# Patient Record
Sex: Female | Born: 1965 | Race: Black or African American | Hispanic: No | Smoking: Never smoker
Health system: Southern US, Community
[De-identification: ages and names within clinical notes are randomized; demographics above are authoritative.]

## PROBLEM LIST (undated history)

## (undated) DIAGNOSIS — D649 Anemia, unspecified: Secondary | ICD-10-CM

## (undated) DIAGNOSIS — I10 Essential (primary) hypertension: Secondary | ICD-10-CM

## (undated) DIAGNOSIS — K635 Polyp of colon: Secondary | ICD-10-CM

## (undated) DIAGNOSIS — K5730 Diverticulosis of large intestine without perforation or abscess without bleeding: Secondary | ICD-10-CM

## (undated) HISTORY — PX: TUBAL LIGATION: SHX77

## (undated) HISTORY — DX: Polyp of colon: K63.5

## (undated) HISTORY — DX: Diverticulosis of large intestine without perforation or abscess without bleeding: K57.30

## (undated) HISTORY — PX: COLONOSCOPY: SHX174

## (undated) HISTORY — PX: KNEE SURGERY: SHX244

## (undated) HISTORY — PX: POLYPECTOMY: SHX149

---

## 1998-05-27 ENCOUNTER — Other Ambulatory Visit: Admission: RE | Admit: 1998-05-27 | Discharge: 1998-05-27 | Payer: Self-pay | Admitting: Obstetrics and Gynecology

## 2001-09-16 ENCOUNTER — Emergency Department (HOSPITAL_COMMUNITY): Admission: EM | Admit: 2001-09-16 | Discharge: 2001-09-16 | Payer: Self-pay | Admitting: Emergency Medicine

## 2004-04-29 ENCOUNTER — Other Ambulatory Visit: Admission: RE | Admit: 2004-04-29 | Discharge: 2004-04-29 | Payer: Self-pay | Admitting: Family Medicine

## 2005-05-03 ENCOUNTER — Other Ambulatory Visit: Admission: RE | Admit: 2005-05-03 | Discharge: 2005-05-03 | Payer: Self-pay | Admitting: Family Medicine

## 2006-09-18 ENCOUNTER — Other Ambulatory Visit: Admission: RE | Admit: 2006-09-18 | Discharge: 2006-09-18 | Payer: Self-pay | Admitting: Family Medicine

## 2008-01-29 ENCOUNTER — Other Ambulatory Visit: Admission: RE | Admit: 2008-01-29 | Discharge: 2008-01-29 | Payer: Self-pay | Admitting: Family Medicine

## 2008-10-28 ENCOUNTER — Emergency Department (HOSPITAL_COMMUNITY): Admission: EM | Admit: 2008-10-28 | Discharge: 2008-10-28 | Payer: Self-pay | Admitting: Emergency Medicine

## 2008-12-10 ENCOUNTER — Encounter: Admission: RE | Admit: 2008-12-10 | Discharge: 2008-12-10 | Payer: Self-pay | Admitting: Chiropractic Medicine

## 2010-07-08 ENCOUNTER — Other Ambulatory Visit: Admission: RE | Admit: 2010-07-08 | Discharge: 2010-07-08 | Payer: Self-pay | Admitting: Family Medicine

## 2012-01-15 ENCOUNTER — Other Ambulatory Visit: Payer: Self-pay | Admitting: Family Medicine

## 2012-01-15 ENCOUNTER — Other Ambulatory Visit (HOSPITAL_COMMUNITY)
Admission: RE | Admit: 2012-01-15 | Discharge: 2012-01-15 | Disposition: A | Discharge status: Home / Self Care | Payer: BC Managed Care – PPO | Source: Ambulatory Visit | Attending: Family Medicine | Admitting: Family Medicine

## 2012-01-15 DIAGNOSIS — Z01419 Encounter for gynecological examination (general) (routine) without abnormal findings: Secondary | ICD-10-CM | POA: Insufficient documentation

## 2013-04-23 ENCOUNTER — Other Ambulatory Visit: Payer: Self-pay | Admitting: Radiology

## 2013-04-24 ENCOUNTER — Encounter (INDEPENDENT_AMBULATORY_CARE_PROVIDER_SITE_OTHER): Payer: Self-pay

## 2013-05-06 ENCOUNTER — Encounter (INDEPENDENT_AMBULATORY_CARE_PROVIDER_SITE_OTHER): Payer: Self-pay | Admitting: Surgery

## 2013-05-06 ENCOUNTER — Ambulatory Visit (INDEPENDENT_AMBULATORY_CARE_PROVIDER_SITE_OTHER): Payer: BC Managed Care – PPO | Admitting: Surgery

## 2013-05-06 VITALS — BP 140/98 | HR 72 | Temp 97.6°F | Resp 15 | Ht 66.0 in | Wt 235.4 lb

## 2013-05-06 DIAGNOSIS — D249 Benign neoplasm of unspecified breast: Secondary | ICD-10-CM

## 2013-05-06 DIAGNOSIS — D242 Benign neoplasm of left breast: Secondary | ICD-10-CM | POA: Insufficient documentation

## 2013-05-06 NOTE — Progress Notes (Signed)
Patient ID: Mia Davis, female   DOB: Sep 15, 1966, 47 y.o.   MRN: [id number]  Chief Complaint  Patient presents with  . New Evaluation    eval lft br    HPI Mia Davis is a 47 y.o. female.  *Referred by Dr. Tilda Burrow for left breast intraductal papilloma PCP - Dr. Lupe Carney  HPI This is a healthy female who recently underwent a routine screening mammogram. This showed a potential abnormality behind the left nipple. She underwent spot compression views and ultrasound which showed a hypoechoic mass at 3:00 in the retroareolar region measuring 7 x 8 mm. She then underwent needle biopsy which revealed intraductal papilloma with usual ductal hyperplasia. She is now referred for surgical evaluation.  Menarche - age 23 First Preg - 49 Breast feed - no Hormones - OCP 3 years   History reviewed. No pertinent past medical history.  Past Surgical History  Procedure Laterality Date  . Tubal ligation      Family History  Problem Relation Age of Onset  . Cancer Maternal Grandmother     pancreatic  Possible breast cancer in mother - patient unsure  Social History History  Substance Use Topics  . Smoking status: Never Smoker   . Smokeless tobacco: Never Used  . Alcohol Use: No    No Known Allergies  No current outpatient prescriptions on file.   No current facility-administered medications for this visit.    Review of Systems Review of Systems  Constitutional: Negative for fever, chills and unexpected weight change.  HENT: Negative for hearing loss, congestion, sore throat, trouble swallowing and voice change.   Eyes: Negative for visual disturbance.  Respiratory: Negative for cough and wheezing.   Cardiovascular: Negative for chest pain, palpitations and leg swelling.  Gastrointestinal: Negative for nausea, vomiting, abdominal pain, diarrhea, constipation, blood in stool, abdominal distention and anal bleeding.  Genitourinary: Negative for hematuria, vaginal bleeding  and difficulty urinating.  Musculoskeletal: Negative for arthralgias.  Skin: Negative for rash and wound.  Neurological: Negative for seizures, syncope and headaches.  Hematological: Negative for adenopathy. Does not bruise/bleed easily.  Psychiatric/Behavioral: Negative for confusion.    Blood pressure 140/98, pulse 72, temperature 97.6 F (36.4 C), temperature source Temporal, resp. rate 15, height 5\' 6"  (1.676 m), weight 235 lb 6.4 oz (106.777 kg).  Physical Exam Physical Exam WDWN in NAD HEENT:  EOMI, sclera anicteric Neck:  No masses, no thyromegaly Lungs:  CTA bilaterally; normal respiratory effort Breasts:  Symmetric; bilateral fibrocystic changes; no nipple retraction or discharge No palpable masses in either breast.  No axillary lymphadenopathy CV:  Regular rate and rhythm; no murmurs Abd:  +bowel sounds, soft, non-tender, no masses Ext:  Well-perfused; no edema Skin:  Warm, dry; no sign of jaundice  Data Reviewed Mammogram/ US/ path report as noted above  Assessment    Left intraductal papilloma - 3:00 retroareola     Plan    Left needle-localized lumpectomy.  The surgical procedure has been discussed with the patient.  Potential risks, benefits, alternative treatments, and expected outcomes have been explained.  All of the patient's questions at this time have been answered.  The likelihood of reaching the patient's treatment goal is good.  The patient understand the proposed surgical procedure and wishes to proceed.         Matika Bartell K. 05/06/2013, 5:41 PM

## 2013-05-08 ENCOUNTER — Telehealth (INDEPENDENT_AMBULATORY_CARE_PROVIDER_SITE_OTHER): Payer: Self-pay | Admitting: Surgery

## 2013-05-08 NOTE — Telephone Encounter (Signed)
Discussed pt financial responsibilities and placed in pending folder.

## 2013-05-12 ENCOUNTER — Encounter (INDEPENDENT_AMBULATORY_CARE_PROVIDER_SITE_OTHER): Payer: Self-pay

## 2013-11-21 ENCOUNTER — Emergency Department (HOSPITAL_COMMUNITY): Payer: BC Managed Care – PPO

## 2013-11-21 ENCOUNTER — Emergency Department (HOSPITAL_COMMUNITY)
Admission: EM | Admit: 2013-11-21 | Discharge: 2013-11-21 | Disposition: A | Discharge status: Home / Self Care | Payer: Self-pay | Attending: Emergency Medicine | Admitting: Emergency Medicine

## 2013-11-21 ENCOUNTER — Encounter (HOSPITAL_COMMUNITY): Payer: Self-pay | Admitting: Emergency Medicine

## 2013-11-21 DIAGNOSIS — S0003XA Contusion of scalp, initial encounter: Secondary | ICD-10-CM | POA: Insufficient documentation

## 2013-11-21 DIAGNOSIS — K5792 Diverticulitis of intestine, part unspecified, without perforation or abscess without bleeding: Secondary | ICD-10-CM

## 2013-11-21 DIAGNOSIS — S301XXA Contusion of abdominal wall, initial encounter: Secondary | ICD-10-CM | POA: Insufficient documentation

## 2013-11-21 DIAGNOSIS — K6289 Other specified diseases of anus and rectum: Secondary | ICD-10-CM | POA: Insufficient documentation

## 2013-11-21 DIAGNOSIS — Z3202 Encounter for pregnancy test, result negative: Secondary | ICD-10-CM | POA: Insufficient documentation

## 2013-11-21 DIAGNOSIS — R296 Repeated falls: Secondary | ICD-10-CM | POA: Insufficient documentation

## 2013-11-21 DIAGNOSIS — S1093XA Contusion of unspecified part of neck, initial encounter: Secondary | ICD-10-CM

## 2013-11-21 DIAGNOSIS — Y9301 Activity, walking, marching and hiking: Secondary | ICD-10-CM | POA: Insufficient documentation

## 2013-11-21 DIAGNOSIS — K5732 Diverticulitis of large intestine without perforation or abscess without bleeding: Secondary | ICD-10-CM | POA: Insufficient documentation

## 2013-11-21 DIAGNOSIS — Y929 Unspecified place or not applicable: Secondary | ICD-10-CM | POA: Insufficient documentation

## 2013-11-21 DIAGNOSIS — S0083XA Contusion of other part of head, initial encounter: Secondary | ICD-10-CM

## 2013-11-21 LAB — URINALYSIS, ROUTINE W REFLEX MICROSCOPIC
BILIRUBIN URINE: NEGATIVE
GLUCOSE, UA: NEGATIVE mg/dL
Ketones, ur: NEGATIVE mg/dL
Leukocytes, UA: NEGATIVE
Nitrite: NEGATIVE
PH: 6.5 (ref 5.0–8.0)
Protein, ur: NEGATIVE mg/dL
SPECIFIC GRAVITY, URINE: 1.007 (ref 1.005–1.030)
Urobilinogen, UA: 0.2 mg/dL (ref 0.0–1.0)

## 2013-11-21 LAB — CBC WITH DIFFERENTIAL/PLATELET
BASOS ABS: 0 10*3/uL (ref 0.0–0.1)
Basophils Relative: 0 % (ref 0–1)
Eosinophils Absolute: 0.1 10*3/uL (ref 0.0–0.7)
Eosinophils Relative: 1 % (ref 0–5)
HEMATOCRIT: 26.9 % — AB (ref 36.0–46.0)
HEMOGLOBIN: 9.2 g/dL — AB (ref 12.0–15.0)
LYMPHS PCT: 22 % (ref 12–46)
Lymphs Abs: 2.4 10*3/uL (ref 0.7–4.0)
MCH: 23.7 pg — ABNORMAL LOW (ref 26.0–34.0)
MCHC: 34.2 g/dL (ref 30.0–36.0)
MCV: 69.3 fL — ABNORMAL LOW (ref 78.0–100.0)
MONOS PCT: 5 % (ref 3–12)
Monocytes Absolute: 0.6 10*3/uL (ref 0.1–1.0)
NEUTROS ABS: 7.9 10*3/uL — AB (ref 1.7–7.7)
NEUTROS PCT: 72 % (ref 43–77)
Platelets: 253 10*3/uL (ref 150–400)
RBC: 3.88 MIL/uL (ref 3.87–5.11)
RDW: 16.9 % — AB (ref 11.5–15.5)
WBC: 11 10*3/uL — AB (ref 4.0–10.5)

## 2013-11-21 LAB — COMPREHENSIVE METABOLIC PANEL
ALBUMIN: 3 g/dL — AB (ref 3.5–5.2)
ALT: 15 U/L (ref 0–35)
AST: 15 U/L (ref 0–37)
Alkaline Phosphatase: 69 U/L (ref 39–117)
BUN: 14 mg/dL (ref 6–23)
CALCIUM: 8.2 mg/dL — AB (ref 8.4–10.5)
CO2: 23 meq/L (ref 19–32)
Chloride: 102 mEq/L (ref 96–112)
Creatinine, Ser: 0.99 mg/dL (ref 0.50–1.10)
GFR calc Af Amer: 76 mL/min — ABNORMAL LOW (ref >90)
GFR calc non Af Amer: 66 mL/min — ABNORMAL LOW (ref >90)
Glucose, Bld: 125 mg/dL — ABNORMAL HIGH (ref 70–99)
Potassium: 3.4 mEq/L — ABNORMAL LOW (ref 3.7–5.3)
SODIUM: 136 meq/L — AB (ref 137–147)
TOTAL PROTEIN: 6.3 g/dL (ref 6.0–8.3)
Total Bilirubin: 0.5 mg/dL (ref 0.3–1.2)

## 2013-11-21 LAB — URINE MICROSCOPIC-ADD ON

## 2013-11-21 LAB — OCCULT BLOOD, POC DEVICE: FECAL OCCULT BLD: POSITIVE — AB

## 2013-11-21 LAB — PREGNANCY, URINE: PREG TEST UR: NEGATIVE

## 2013-11-21 MED ORDER — METRONIDAZOLE 500 MG PO TABS: 500.0000 mg | ORAL_TABLET | Freq: Three times a day (TID) | ORAL | Status: DC

## 2013-11-21 MED ORDER — CIPROFLOXACIN IN D5W 400 MG/200ML IV SOLN: 400.0000 mg | Freq: Once | INTRAVENOUS | Status: DC

## 2013-11-21 MED ORDER — METRONIDAZOLE IN NACL 5-0.79 MG/ML-% IV SOLN: 500.0000 mg | Freq: Once | INTRAVENOUS | Status: DC

## 2013-11-21 MED ORDER — SODIUM CHLORIDE 0.9 % IV BOLUS (SEPSIS)
1000.0000 mL | Freq: Once | INTRAVENOUS | Status: AC
  Administered 2013-11-21: 1000 mL via INTRAVENOUS

## 2013-11-21 MED ORDER — IOHEXOL 300 MG/ML  SOLN
100.0000 mL | Freq: Once | INTRAMUSCULAR | Status: AC | PRN
  Administered 2013-11-21: 100 mL via INTRAVENOUS

## 2013-11-21 MED ORDER — IOHEXOL 300 MG/ML  SOLN
25.0000 mL | Freq: Once | INTRAMUSCULAR | Status: AC | PRN
  Administered 2013-11-21: 25 mL via ORAL

## 2013-11-21 MED ORDER — ONDANSETRON HCL 4 MG/2ML IJ SOLN
4.0000 mg | Freq: Once | INTRAMUSCULAR | Status: DC
  Filled 2013-11-21: qty 2

## 2013-11-21 MED ORDER — MORPHINE SULFATE 4 MG/ML IJ SOLN
4.0000 mg | Freq: Once | INTRAMUSCULAR | Status: DC
  Filled 2013-11-21: qty 1

## 2013-11-21 MED ORDER — CIPROFLOXACIN HCL 500 MG PO TABS
500.0000 mg | ORAL_TABLET | Freq: Once | ORAL | Status: AC
  Administered 2013-11-21: 500 mg via ORAL
  Filled 2013-11-21: qty 1

## 2013-11-21 MED ORDER — METRONIDAZOLE 500 MG PO TABS
500.0000 mg | ORAL_TABLET | Freq: Once | ORAL | Status: AC
  Administered 2013-11-21: 500 mg via ORAL
  Filled 2013-11-21: qty 1

## 2013-11-21 MED ORDER — CIPROFLOXACIN HCL 500 MG PO TABS: 500.0000 mg | ORAL_TABLET | Freq: Two times a day (BID) | ORAL | Status: DC

## 2013-11-21 NOTE — ED Provider Notes (Signed)
CSN: [id number]     Arrival date & time 11/21/13  0400 History   First MD Initiated Contact with Patient 11/21/13 0436     Chief Complaint  Patient presents with  . Rectal Bleeding     (Consider location/radiation/quality/duration/timing/severity/associated sxs/prior Treatment) HPI 48 year old female presents to emergency room with complaint of bright red blood per rectum since 6 PM this evening.  No prior history of same.  No rectal pain, no abdominal pain.  No stool mixed with blood only blood.  She reports she has passed some clots.  Patient had syncopal event walking from the bathroom, has abrasion and bruise to right abdomen, and contusion to left forhead.  She complains of mild headache.  Dizzy with standing.  Area to prior history of rectal bleeding, hemorrhoids, diverticulitis.  No fevers no chills.  No nausea no vomiting. History reviewed. No pertinent past medical history. Past Surgical History  Procedure Laterality Date  . Tubal ligation     Family History  Problem Relation Age of Onset  . Cancer Maternal Grandmother     pancreatic   History  Substance Use Topics  . Smoking status: Never Smoker   . Smokeless tobacco: Never Used  . Alcohol Use: No   OB History   Grav Para Term Preterm Abortions TAB SAB Ect Mult Living                 Review of Systems  See History of Present Illness; otherwise all other systems are reviewed and negative   Allergies  Review of patient's allergies indicates no known allergies.  Home Medications  No current outpatient prescriptions on file. BP 125/92  Pulse 107  Temp(Src) 98.4 F (36.9 C) (Oral)  Resp 16  Ht 5\' 6"  (1.676 m)  Wt 220 lb (99.791 kg)  BMI 35.53 kg/m2  SpO2 100% Physical Exam  Nursing note and vitals reviewed. Constitutional: She is oriented to person, place, and time. She appears well-developed and well-nourished. She appears distressed (uncomfortable appearing).  HENT:  Head: Normocephalic.  Right Ear:  External ear normal.  Left Ear: External ear normal.  Nose: Nose normal.  Mouth/Throat: Oropharynx is clear and moist.  Contusion to left for head and temple area  Eyes: Conjunctivae and EOM are normal. Pupils are equal, round, and reactive to light.  Neck: Normal range of motion. Neck supple. No JVD present. No tracheal deviation present. No thyromegaly present.  Cardiovascular: Normal rate, regular rhythm, normal heart sounds and intact distal pulses.  Exam reveals no gallop and no friction rub.   No murmur heard. Pulmonary/Chest: Effort normal and breath sounds normal. No stridor. No respiratory distress. She has no wheezes. She has no rales. She exhibits no tenderness.  Abdominal: Soft. Bowel sounds are normal. She exhibits no distension and no mass. There is tenderness (tenderness to palpation inleft lower quadrant). There is no rebound and no guarding.  Patient with abrasion and reddening of the skin in right upper quadrant from fall  Genitourinary: Guaiac positive stool (streaks of bright red blood on glove with  Patient does have hemorrhoid, but is nonthrombosed and nontender).  Musculoskeletal: Normal range of motion. She exhibits no edema and no tenderness.  Lymphadenopathy:    She has no cervical adenopathy.  Neurological: She is alert and oriented to person, place, and time. She exhibits normal muscle tone. Coordination normal.  Skin: Skin is warm and dry. No rash noted. No erythema. No pallor.  Psychiatric: She has a normal mood and affect. Her behavior  is normal. Judgment and thought content normal.    ED Course  Procedures (including critical care time) Labs Review Labs Reviewed  CBC WITH DIFFERENTIAL - Abnormal; Notable for the following:    WBC 11.0 (*)    Hemoglobin 9.2 (*)    HCT 26.9 (*)    MCV 69.3 (*)    MCH 23.7 (*)    RDW 16.9 (*)    Neutro Abs 7.9 (*)    All other components within normal limits  COMPREHENSIVE METABOLIC PANEL - Abnormal; Notable for the  following:    Sodium 136 (*)    Potassium 3.4 (*)    Glucose, Bld 125 (*)    Calcium 8.2 (*)    Albumin 3.0 (*)    GFR calc non Af Amer 66 (*)    GFR calc Af Amer 76 (*)    All other components within normal limits  URINALYSIS, ROUTINE W REFLEX MICROSCOPIC - Abnormal; Notable for the following:    Color, Urine STRAW (*)    Hgb urine dipstick TRACE (*)    All other components within normal limits  OCCULT BLOOD, POC DEVICE - Abnormal; Notable for the following:    Fecal Occult Bld POSITIVE (*)    All other components within normal limits  PREGNANCY, URINE  URINE MICROSCOPIC-ADD ON  OCCULT BLOOD X 1 CARD TO LAB, STOOL   Imaging Review Ct Head Wo Contrast  11/21/2013   CLINICAL DATA:  Pain post trauma  EXAM: CT HEAD WITHOUT CONTRAST  TECHNIQUE: Contiguous axial images were obtained from the base of the skull through the vertex without intravenous contrast.  COMPARISON:  None.  FINDINGS: The ventricles are normal in size and configuration. There is no mass, hemorrhage, extra-axial fluid collection, or midline shift. Gray-white compartments appear normal. No demonstrable acute infarct. Bony calvarium appears intact. The mastoid air cells are clear.  IMPRESSION: Study within normal limits.   Electronically Signed   By: Lowella Grip M.D.   On: 11/21/2013 07:07   Ct Abdomen Pelvis W Contrast  11/21/2013   ADDENDUM REPORT: 11/21/2013 07:14  ADDENDUM: Sentence in the conclusion should read: Suspect a degree of proctitis.   Electronically Signed   By: Lowella Grip M.D.   On: 11/21/2013 07:14   11/21/2013   CLINICAL DATA:  Pain and rectal bleeding  EXAM: CT ABDOMEN AND PELVIS WITH CONTRAST  TECHNIQUE: Multidetector CT imaging of the abdomen and pelvis was performed using the standard protocol following bolus administration of intravenous contrast. Oral contrast was also administered.  CONTRAST:  158mL OMNIPAQUE IOHEXOL 300 MG/ML  SOLN  COMPARISON:  None.  FINDINGS: On axial slice 7, there is a  6 mm nodular opacity in the anterior segment of the left lower lobe. On axial slice 2, there is a 2 mm nodular opacity in the anterior segment of the left lower lobe. There is no airspace consolidation in the lung bases.  There is fatty infiltration in the liver. Beyond fatty infiltration, there are no focal liver lesions. There is no biliary duct dilatation. Gallbladder wall does not appear appreciably thickened.  There is a small calcified granuloma in the posterior spleen. Spleen otherwise appears normal. Pancreas and adrenals appear normal.  Kidneys bilaterally show no appreciable mass or hydronephrosis on either side. No renal or ureteral calculus is seen on either side.  In the pelvis, the ureter is enlarged with multiple lobulations and mass lesions. There is a dominant mass within the uterus measuring 6.0 x 5.2 cm. There is  stranding in the soft tissues adjacent to the rectum with some thickening of the wall of the rectum. There is a small amount of loculated fluid immediately adjacent to the proximal sigmoid colon, probably representing some localized colonic inflammation, likely diverticulitis. A well-defined diverticulum is not seen in this area, however. There is no pelvic mass seen. Appendix appears normal.  There is no bowel obstruction. There is no free air or portal venous air.  There is no adenopathy or abscess in the abdomen or pelvis. Aorta is non aneurysmal. There are no blastic or lytic bone lesions.  IMPRESSION: There is localized inflammation immediately adjacent to the proximal sigmoid colon anteriorly with localized loculated fluid in this area. Suspect a small area of localized diverticulitis.  There is perirectal soft tissue stranding in mild rectal thickening. Suspect a degree of prostatitis.  Enlarged uterus with presumed leiomyomas throughout the uterus.  Appendix appears normal. No renal or ureteral calculus. No hydronephrosis.  Fatty infiltration in the liver.  Nodular opacities in  the left lung base, largest measuring 6 mm. Followup should be based on Fleischner Society guidelines. If the patient is at high risk for bronchogenic carcinoma, follow-up chest CT at 6-12 months is recommended. If the patient is at low risk for bronchogenic carcinoma, follow-up chest CT at 12 months is recommended. This recommendation follows the consensus statement: Guidelines for Management of Small Pulmonary Nodules Detected on CT Scans: A Statement from the [place] as published in Radiology 2005;237:395-400.  Electronically Signed: By: Lowella Grip M.D. On: 11/21/2013 07:06    EKG Interpretation   None       MDM   Final diagnoses:  Diverticulitis  Proctitis    48 year old female with lower GI bleeding, left lower quadrant pain.  Concern for diverticulitis.  Patient noted to have anemia, reports history of same, but does not know her normal values.  She has no significant elevation in her BUN.  He shouldn't with syncopal event, probable orthostatic hypotension, possible vagal reaction.  She does have a positive orthostatics.  Will give IV fluids.    Kalman Drape, MD 11/21/13 [phone]

## 2013-11-21 NOTE — Discharge Instructions (Signed)
Please take medications as prescribed.  Return to the emergency room for fevers, worsening bleeding, repeated episodes of passing out or other new concerning symptoms.  Rest.  Drink plenty of fluids.  Stick to a bland diet until feeling better.   Bloody Stools Bloody stools often mean that there is a problem in the digestive tract. Your caregiver may use the term "melena" to describe black, tarry, and bad smelling stools or "hematochezia" to describe red or maroon-colored stools. Blood seen in the stool can be caused by bleeding anywhere along the intestinal tract.  A black stool usually means that blood is coming from the upper part of the gastrointestinal tract (esophagus, stomach, or small bowel). Passing maroon-colored stools or bright red blood usually means that blood is coming from lower down in the large bowel or the rectum. However, sometimes massive bleeding in the stomach or small intestine can cause bright red bloody stools.  Consuming black licorice, lead, iron pills, medicines containing bismuth subsalicylate, or blueberries can also cause black stools. Your caregiver can test black stools to see if blood is present. It is important that the cause of the bleeding be found. Treatment can then be started, and the problem can be corrected. Rectal bleeding may not be serious, but you should not assume everything is okay until you know the cause.It is very important to follow up with your caregiver or a specialist in gastrointestinal problems. CAUSES  Blood in the stools can come from various underlying causes.Often, the cause is not found during your first visit. Testing is often needed to discover the cause of bleeding in the gastrointestinal tract. Causes range from simple to serious or even life-threatening.Possible causes include:  Hemorrhoids.These are veins that are full of blood (engorged) in the rectum. They cause pain, inflammation, and may bleed.  Anal fissures.These are areas  of painful tearing which may bleed. They are often caused by passing hard stool.  Diverticulosis.These are pouches that form on the colon over time, with age, and may bleed significantly.  Diverticulitis.This is inflammation in areas with diverticulosis. It can cause pain, fever, and bloody stools, although bleeding is rare.  Proctitis and colitis. These are inflamed areas of the rectum or colon. They may cause pain, fever, and bloody stools.  Polyps and cancer. Colon cancer is a leading cause of preventable cancer death.It often starts out as precancerous polyps that can be removed during a colonoscopy, preventing progression into cancer. Sometimes, polyps and cancer may cause rectal bleeding.  Gastritis and ulcers.Bleeding from the upper gastrointestinal tract (near the stomach) may travel through the intestines and produce black, sometimes tarry, often bad smelling stools. In certain cases, if the bleeding is fast enough, the stools may not be black, but red and the condition may be life-threatening. SYMPTOMS  You may have stools that are bright red and bloody, that are normal color with blood on them, or that are dark black and tarry. In some cases, you may only have blood in the toilet bowl. Any of these cases need medical care. You may also have:  Pain at the anus or anywhere in the rectum.  Lightheadedness or feeling faint.  Extreme weakness.  Nausea or vomiting.  Fever. DIAGNOSIS Your caregiver may use the following methods to find the cause of your bleeding:  Taking a medical history. Age is important. Older people tend to develop polyps and cancer more often. If there is anal pain and a hard, large stool associated with bleeding, a tear of the  anus may be the cause. If blood drips into the toilet after a bowel movement, bleeding hemorrhoids may be the problem. The color and frequency of the bleeding are additional considerations. In most cases, the medical history provides  clues, but seldom the final answer.  A visual and finger (digital) exam. Your caregiver will inspect the anal area, looking for tears and hemorrhoids. A finger exam can provide information when there is tenderness or a growth inside. In men, the prostate is also examined.  Endoscopy. Several types of small, long scopes (endoscopes) are used to view the colon.  In the office, your caregiver may use a rigid, or more commonly, a flexible viewing sigmoidoscope. This exam is called flexible sigmoidoscopy. It is performed in 5 to 10 minutes.  A more thorough exam is accomplished with a colonoscope. It allows your caregiver to view the entire 5 to 6 foot long colon. Medicine to help you relax (sedative) is usually given for this exam. Frequently, a bleeding lesion may be present beyond the reach of the sigmoidoscope. So, a colonoscopy may be the best exam to start with. Both exams are usually done on an outpatient basis. This means the patient does not stay overnight in the hospital or surgery center.  An upper endoscopy may be needed to examine your stomach. Sedation is used and a flexible endoscope is put in your mouth, down to your stomach.  A barium enema X-ray. This is an X-ray exam. It uses liquid barium inserted by enema into the rectum. This test alone may not identify an actual bleeding point. X-rays highlight abnormal shadows, such as those made by lumps (tumors), diverticuli, or colitis. TREATMENT  Treatment depends on the cause of your bleeding.   For bleeding from the stomach or colon, the caregiver doing your endoscopy or colonoscopy may be able to stop the bleeding as part of the procedure.  Inflammation or infection of the colon can be treated with medicines.  Many rectal problems can be treated with creams, suppositories, or warm baths.  Surgery is sometimes needed.  Blood transfusions are sometimes needed if you have lost a lot of blood.  For any bleeding problem, let your  caregiver know if you take aspirin or other blood thinners regularly. HOME CARE INSTRUCTIONS   Take any medicines exactly as prescribed.  Keep your stools soft by eating a diet high in fiber. Prunes (1 to 3 a day) work well for many people.  Drink enough water and fluids to keep your urine clear or pale yellow.  Take sitz baths if advised. A sitz bath is when you sit in a bathtub with warm water for 10 to 15 minutes to soak, soothe, and cleanse the rectal area.  If enemas or suppositories are advised, be sure you know how to use them. Tell your caregiver if you have problems with this.  Monitor your bowel movements to look for signs of improvement or worsening. SEEK MEDICAL CARE IF:   You do not improve in the time expected.  Your condition worsens after initial improvement.  You develop any new symptoms. SEEK IMMEDIATE MEDICAL CARE IF:   You develop severe or prolonged rectal bleeding.  You vomit blood.  You feel weak or faint.  You have a fever. MAKE SURE YOU:  Understand these instructions.  Will watch your condition.  Will get help right away if you are not doing well or get worse. Document Released: 09/15/2002 Document Revised: 12/18/2011 Document Reviewed: 02/10/2011 [employer] Patient Information 2014 [place].  Diverticulitis A diverticulum is a small pouch or sac on the colon. Diverticulosis is the presence of these diverticula on the colon. Diverticulitis is the irritation (inflammation) or infection of diverticula. CAUSES  The colon and its diverticula contain bacteria. If food particles block the tiny opening to a diverticulum, the bacteria inside can grow and cause an increase in pressure. This leads to infection and inflammation and is called diverticulitis. SYMPTOMS   Abdominal pain and tenderness. Usually, the pain is located on the left side of your abdomen. However, it could be located elsewhere.  Fever.  Bloating.  Feeling sick to your  stomach (nausea).  Throwing up (vomiting).  Abnormal stools. DIAGNOSIS  Your caregiver will take a history and perform a physical exam. Since many things can cause abdominal pain, other tests may be necessary. Tests may include:  Blood tests.  Urine tests.  X-ray of the abdomen.  CT scan of the abdomen. Sometimes, surgery is needed to determine if diverticulitis or other conditions are causing your symptoms. TREATMENT  Most of the time, you can be treated without surgery. Treatment includes:  Resting the bowels by only having liquids for a few days. As you improve, you will need to eat a low-fiber diet.  Intravenous (IV) fluids if you are losing body fluids (dehydrated).  Antibiotic medicines that treat infections may be given.  Pain and nausea medicine, if needed.  Surgery if the inflamed diverticulum has burst. HOME CARE INSTRUCTIONS   Try a clear liquid diet (broth, tea, or water for as long as directed by your caregiver). You may then gradually begin a low-fiber diet as tolerated.  A low-fiber diet is a diet with less than 10 grams of fiber. Choose the foods below to reduce fiber in the diet:  White breads, cereals, rice, and pasta.  Cooked fruits and vegetables or soft fresh fruits and vegetables without the skin.  Ground or well-cooked tender beef, ham, veal, lamb, pork, or poultry.  Eggs and seafood.  After your diverticulitis symptoms have improved, your caregiver may put you on a high-fiber diet. A high-fiber diet includes 14 grams of fiber for every 1000 calories consumed. For a standard 2000 calorie diet, you would need 28 grams of fiber. Follow these diet guidelines to help you increase the fiber in your diet. It is important to slowly increase the amount fiber in your diet to avoid gas, constipation, and bloating.  Choose whole-grain breads, cereals, pasta, and brown rice.  Choose fresh fruits and vegetables with the skin on. Do not overcook vegetables  because the more vegetables are cooked, the more fiber is lost.  Choose more nuts, seeds, legumes, dried peas, beans, and lentils.  Look for food products that have greater than 3 grams of fiber per serving on the Nutrition Facts label.  Take all medicine as directed by your caregiver.  If your caregiver has given you a follow-up appointment, it is very important that you go. Not going could result in lasting (chronic) or permanent injury, pain, and disability. If there is any problem keeping the appointment, call to reschedule. SEEK MEDICAL CARE IF:   Your pain does not improve.  You have a hard time advancing your diet beyond clear liquids.  Your bowel movements do not return to normal. SEEK IMMEDIATE MEDICAL CARE IF:   Your pain becomes worse.  You have an oral temperature above 102 F (38.9 C), not controlled by medicine.  You have repeated vomiting.  You have bloody or black,  tarry stools.  Symptoms that brought you to your caregiver become worse or are not getting better. MAKE SURE YOU:   Understand these instructions.  Will watch your condition.  Will get help right away if you are not doing well or get worse. Document Released: 07/05/2005 Document Revised: 12/18/2011 Document Reviewed: 10/31/2010 [facility] Patient Information 2014 [place].  Proctitis Proctitis is the swelling and soreness (inflammation) of the lining of the rectum. The rectum is at the end of the large intestine and is attached to the anus. The inflammation causes pain and discomfort. It may be short-term (acute) or long-lasting (chronic). CAUSES Inflammation in the rectum can be caused by many things, such as:  Sexually transmitted diseases (STDs).  Infection.  Anal-rectal trauma or injury.  Ulcerative colitis or Crohn's disease.  Radiation therapy directed near the rectum.  Antibiotic therapy. SYMPTOMS  Sudden, uncomfortable, and frequent urge to have a bowel movement.  Anal  or rectal pain.  Abdominal cramping or pain.  Sensation that the rectum is full.  Rectal bleeding.  Pus or mucus discharge from anus.  Diarrhea or frequent soft, loose stools. DIAGNOSIS Diagnosis may include the following:  A history and physical exam.  An STD test.  Blood tests.  Stool tests.  Rectal culture.  A procedure to evaluate the anal canal (anoscopy).  Procedures to look at part, or the entire large bowel (sigmoidoscopy, colonoscopy). TREATMENT Treatment of proctitis depends on the cause. Reducing the symptoms of inflammation and eliminating infection are the main goals of treatment. Treatment may include:  Home remedies and lifestyle, such as sitz baths and avoiding food right before bedtime.  Topical ointments, foams, suppositories, or enemas, such as corticosteroids or anti-inflammatories.  Antibiotic or antiviral medicines to treat infection or to control harmful bacteria.  Medicines to control diarrhea, soften stools, and reduce pain.  Medicines to suppress the immune system.  Avoiding the activity that caused rectal trauma.  Nutritional, dietary, or herbal supplements.  Heat or laser therapy for persistent bleeding.  A dilation procedure to enlarge a narrowed rectum.  Surgery, though rare, may be necessary to repair damaged rectal lining. HOME CARE INSTRUCTIONS Only take medicines that are recommended or approved by your caregiver.Do not take anti-diarrhea medicine without your caregiver's approval. SEEK MEDICAL CARE IF:  You often experience one or more of the symptoms noted above.  You keep experiencing symptoms after treatment.  You have questions or concerns about your symptoms or treatment plan. MAKE SURE YOU:  Understand these instructions.  Will watch your condition.  Will get help right away if you are not doing well or get worse. [place] of Diabetes and Digestive and Kidney Disease (NIDDK):  [email or web address]-[email or web address] Document Released: 09/14/2011 Document Revised: 01/20/2013 Document Reviewed: 09/14/2011 [facility] Patient Information 2014 [place], [place].

## 2013-11-21 NOTE — ED Notes (Signed)
Pt here for rectal bleed, onset last night at 6 pm, sts bright red in color, pt reports that it is time for her cycle but she knows this was rectal bleeding, reports waking up on floor in room and does not recall passing out. Reddened area noted to left cheek

## 2014-03-05 ENCOUNTER — Encounter (INDEPENDENT_AMBULATORY_CARE_PROVIDER_SITE_OTHER): Payer: Self-pay

## 2014-09-28 ENCOUNTER — Other Ambulatory Visit: Payer: Self-pay | Admitting: Family Medicine

## 2014-09-28 ENCOUNTER — Other Ambulatory Visit (HOSPITAL_COMMUNITY)
Admission: RE | Admit: 2014-09-28 | Discharge: 2014-09-28 | Disposition: A | Discharge status: Home / Self Care | Payer: PRIVATE HEALTH INSURANCE | Source: Ambulatory Visit | Attending: Family Medicine | Admitting: Family Medicine

## 2014-09-28 DIAGNOSIS — Z124 Encounter for screening for malignant neoplasm of cervix: Secondary | ICD-10-CM | POA: Insufficient documentation

## 2014-09-30 LAB — CYTOLOGY - PAP

## 2014-11-20 ENCOUNTER — Other Ambulatory Visit: Payer: Self-pay | Admitting: Gastroenterology

## 2014-11-20 LAB — HM COLONOSCOPY

## 2014-12-01 ENCOUNTER — Other Ambulatory Visit: Payer: Self-pay | Admitting: Family Medicine

## 2014-12-01 DIAGNOSIS — R918 Other nonspecific abnormal finding of lung field: Secondary | ICD-10-CM

## 2014-12-31 ENCOUNTER — Ambulatory Visit
Admission: RE | Admit: 2014-12-31 | Discharge: 2014-12-31 | Disposition: A | Discharge status: Home / Self Care | Payer: PRIVATE HEALTH INSURANCE | Source: Ambulatory Visit | Attending: Family Medicine | Admitting: Family Medicine

## 2014-12-31 DIAGNOSIS — R918 Other nonspecific abnormal finding of lung field: Secondary | ICD-10-CM

## 2015-10-10 HISTORY — PX: ABDOMINAL HYSTERECTOMY: SHX81

## 2015-10-30 LAB — HM MAMMOGRAPHY

## 2015-12-09 ENCOUNTER — Other Ambulatory Visit: Payer: Self-pay | Admitting: Obstetrics and Gynecology

## 2015-12-22 IMAGING — CT CT HEAD W/O CM
1 series · 16 of 28 positions shown, 20 images · non-contrast
Comparison: None.

CLINICAL DATA: Pain post trauma

EXAM:
CT HEAD WITHOUT CONTRAST
TECHNIQUE: Contiguous axial images were obtained from the base of the skull
through the vertex without intravenous contrast.

[Series 2: head 5.0 j30s 1 · axial · 0.40mm/px · z∈[-54,+71]mm · 16 of 28 slices shown, 20 images]
[im 2/28  brain]
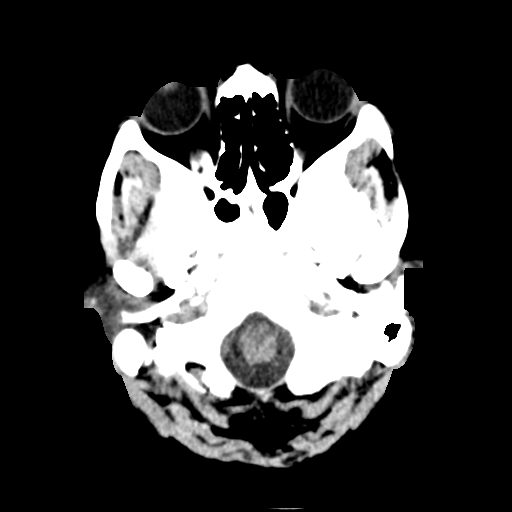
[im 2/28  bone]
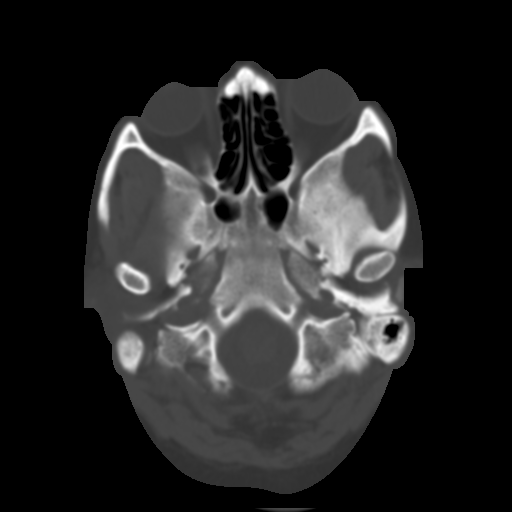
[im 4/28  brain]
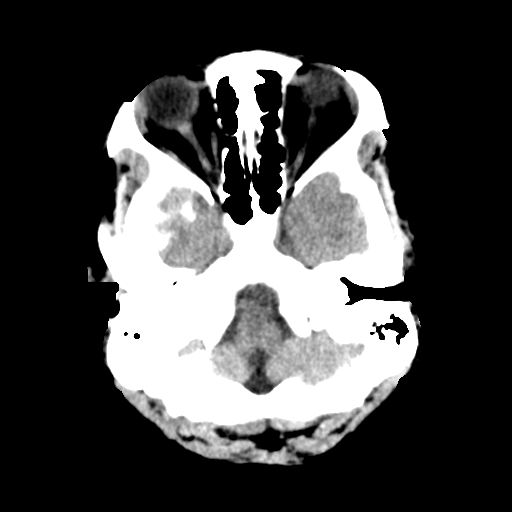
[im 6/28  brain]
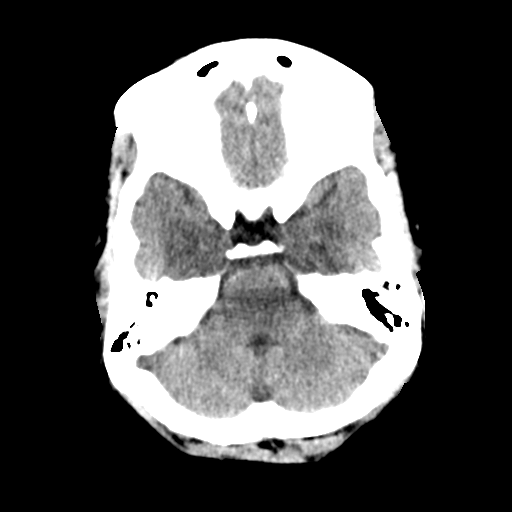
[im 7/28  brain]
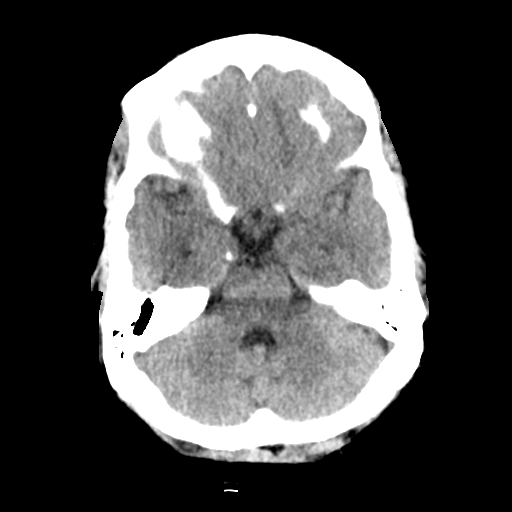
[im 9/28  brain]
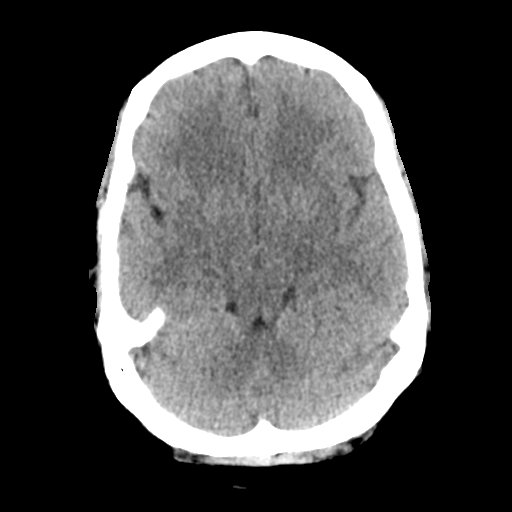
[im 9/28  bone]
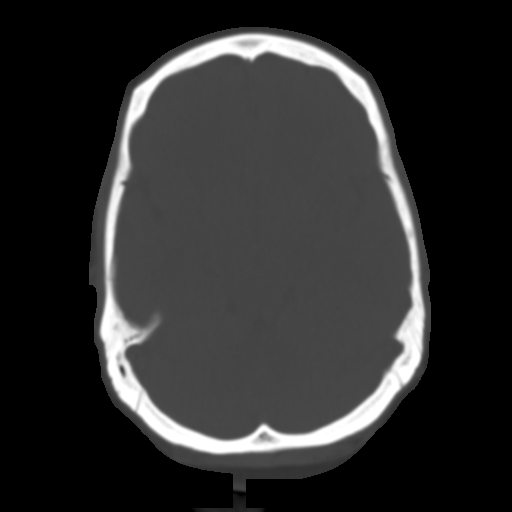
[im 10/28  brain]
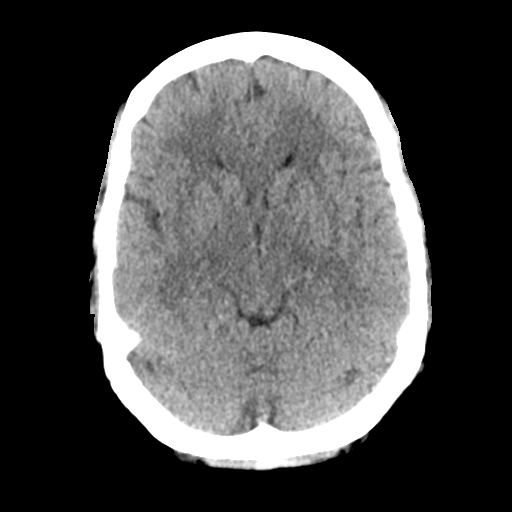
[im 12/28  brain]
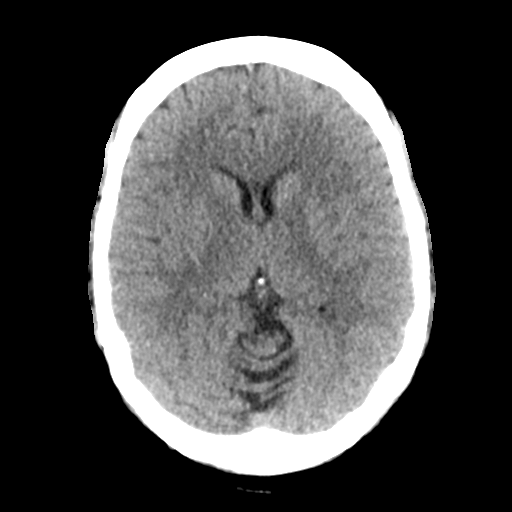
[im 14/28  brain]
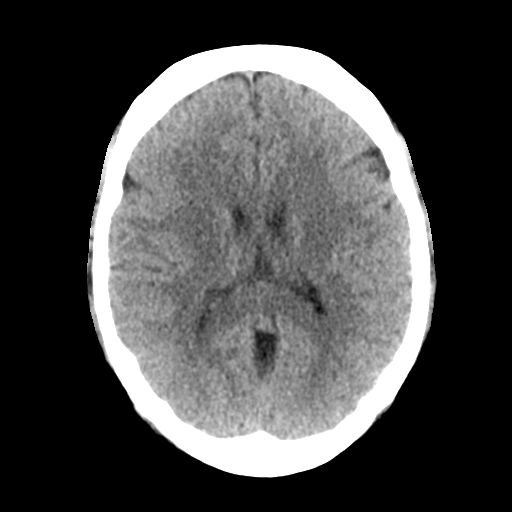
[im 15/28  brain]
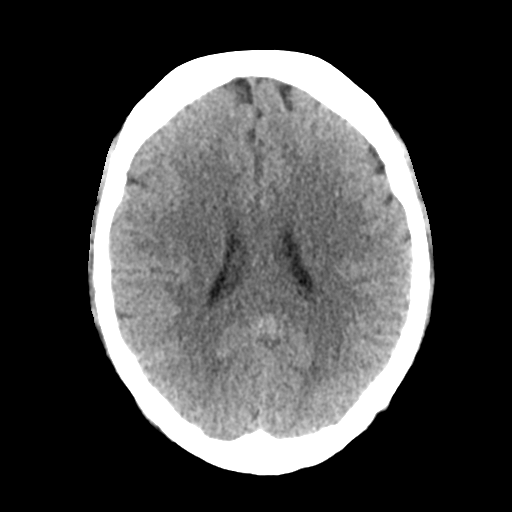
[im 15/28  bone]
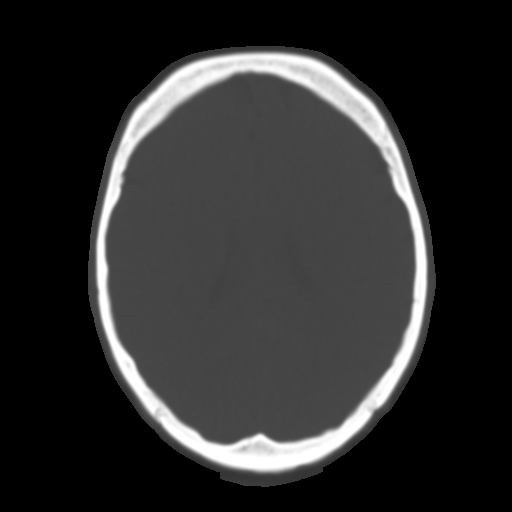
[im 17/28  brain]
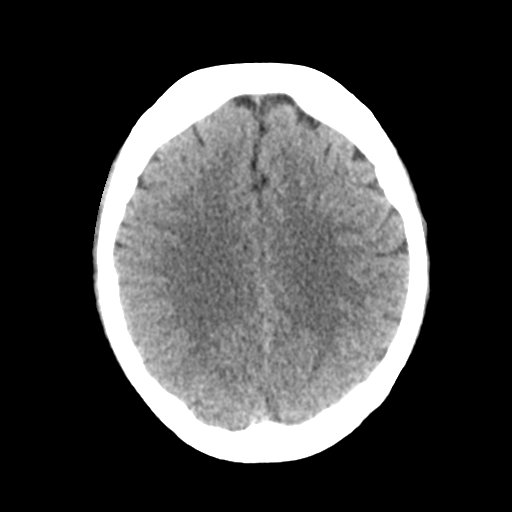
[im 19/28  brain]
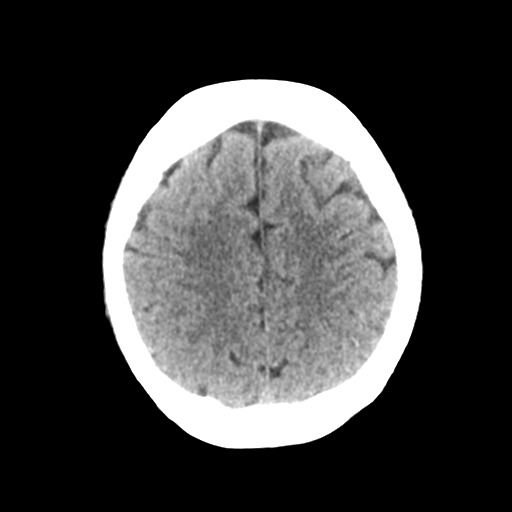
[im 20/28  brain]
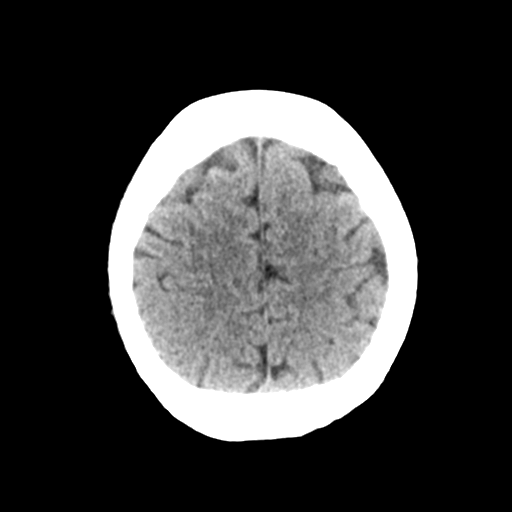
[im 22/28  brain]
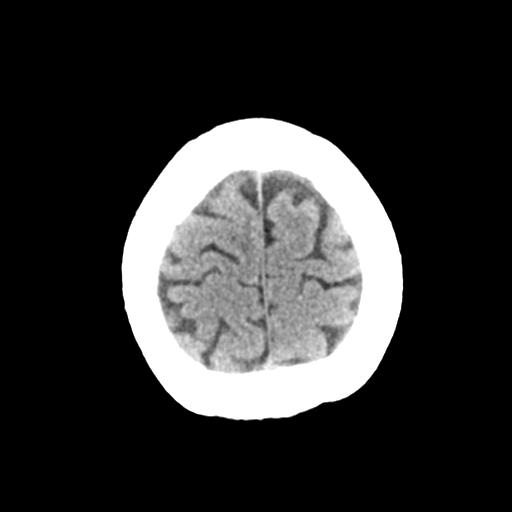
[im 22/28  bone]
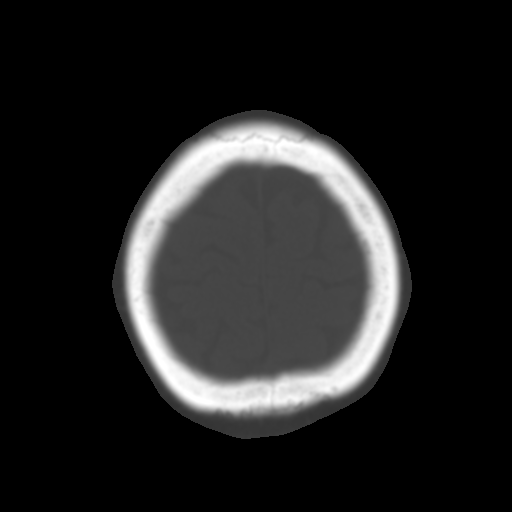
[im 23/28  brain]
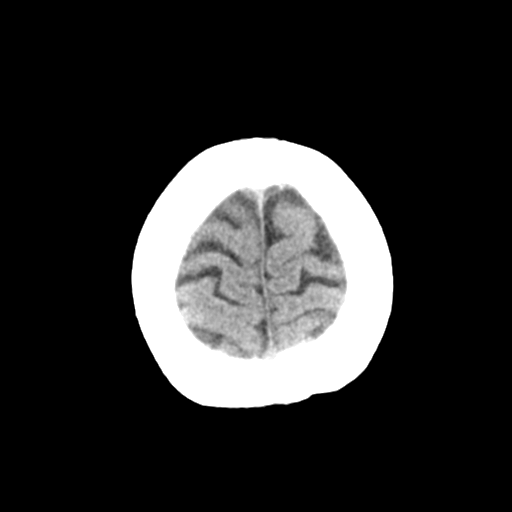
[im 25/28  brain]
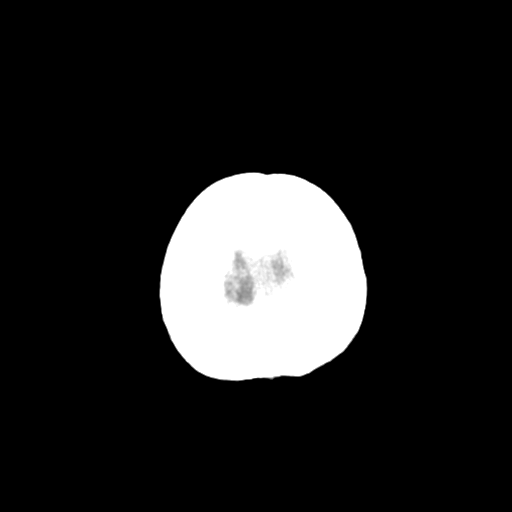
[im 27/28  brain]
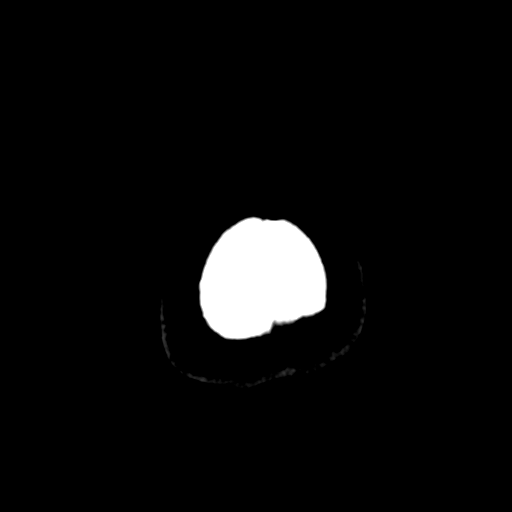

[16 of 28 positions shown; findings below may reference images not displayed]

FINDINGS: The ventricles are normal in size and configuration. There is no
mass, hemorrhage, extra-axial fluid collection, or midline shift.
Gray-white compartments appear normal. No demonstrable acute
infarct. Bony calvarium appears intact. The mastoid air cells are
clear.
IMPRESSION: Study within normal limits.

## 2015-12-22 IMAGING — CT CT ABD-PELV W/ CM
2 of 5 series · 15 of 46 positions shown, 17 images · IV contrast (APPLIED)
Comparison: None.

ADDENDUM:
Sentence in the conclusion should read: Suspect a degree of
proctitis.
CLINICAL DATA: Pain and rectal bleeding

EXAM:
CT ABDOMEN AND PELVIS WITH CONTRAST
TECHNIQUE: Multidetector CT imaging of the abdomen and pelvis was performed
using the standard protocol following bolus administration of
intravenous contrast. Oral contrast was also administered.
CONTRAST:  100mL OMNIPAQUE IOHEXOL 300 MG/ML  SOLN

[Series 3: abd/ pelvis 5.0 i30f 1 · axial · 0.70mm/px · z∈[-772,-327]mm · 12 of 101 slices shown, 14 images]
[im 6/101  soft-tissue]
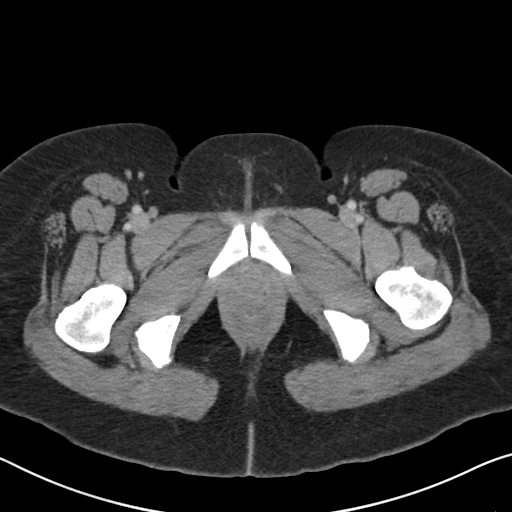
[im 6/101  bone]
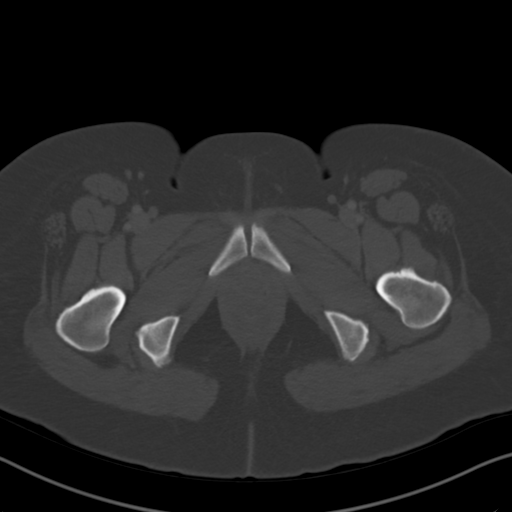
[im 16/101  soft-tissue]
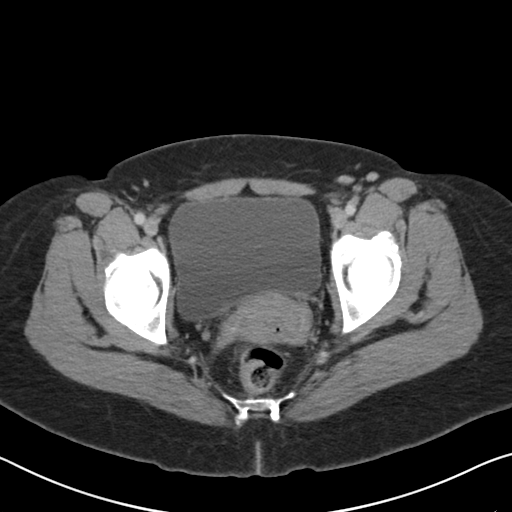
[im 22/101  soft-tissue]
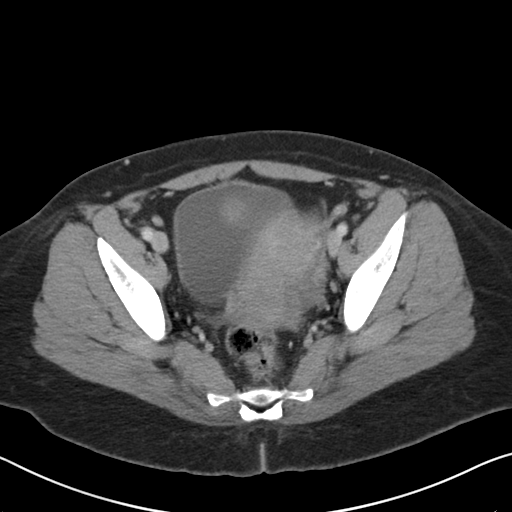
[im 32/101  soft-tissue]
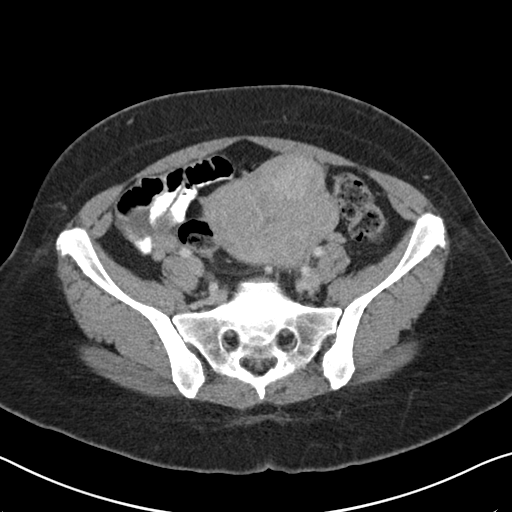
[im 37/101  soft-tissue]
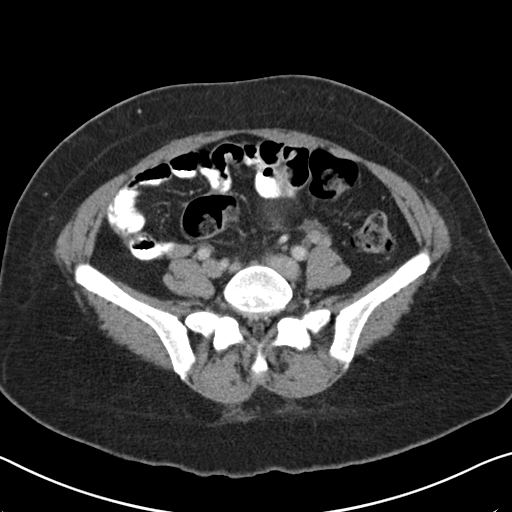
[im 48/101  soft-tissue]
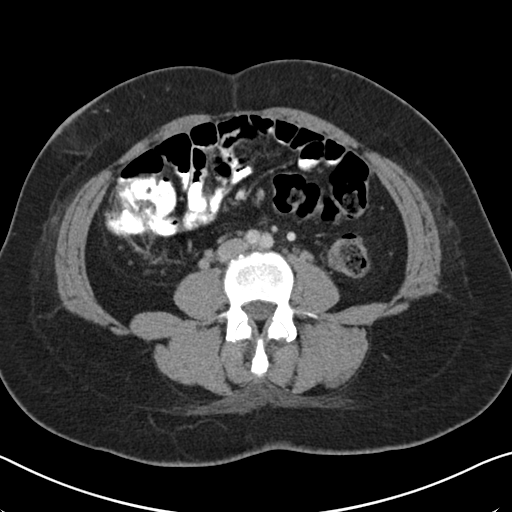
[im 53/101  soft-tissue]
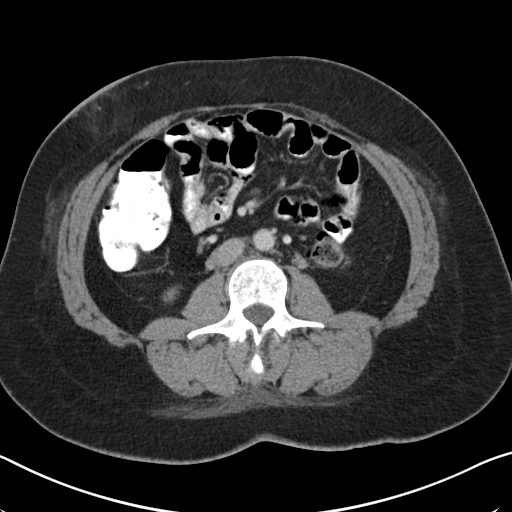
[im 64/101  soft-tissue]
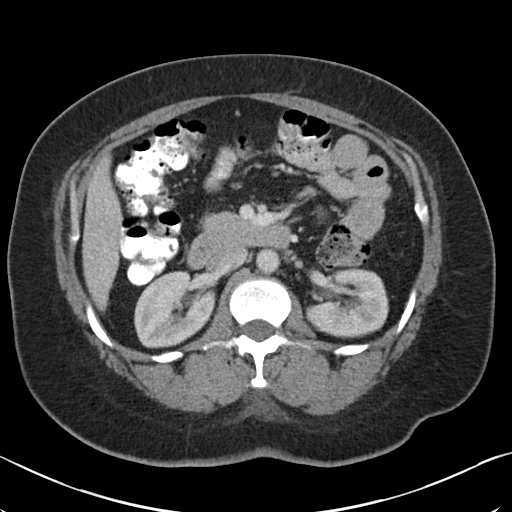
[im 69/101  soft-tissue]
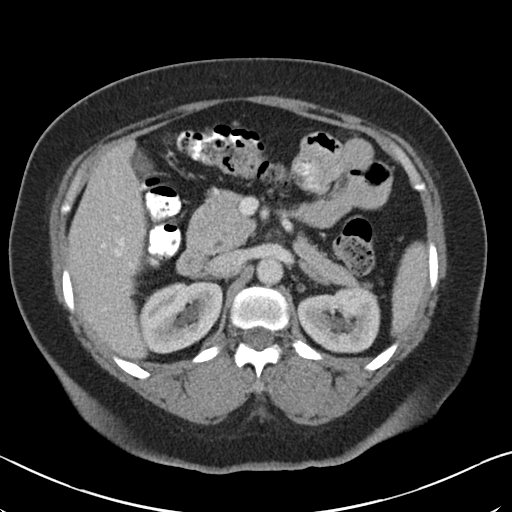
[im 69/101  bone]
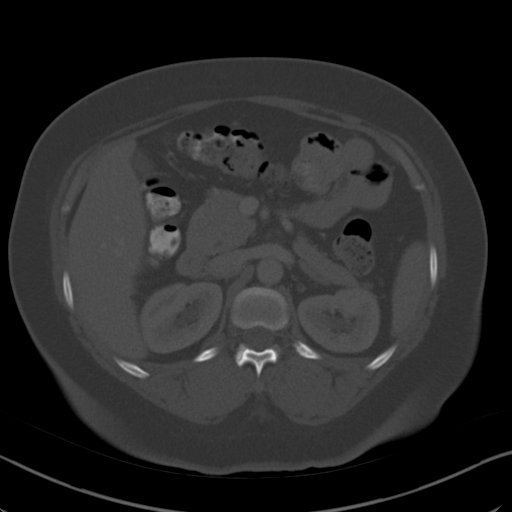
[im 79/101  soft-tissue]
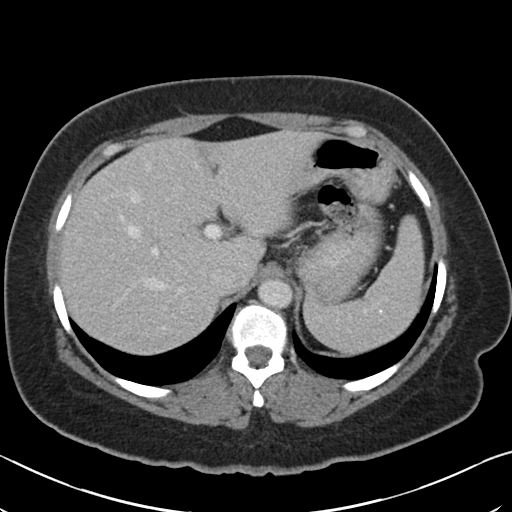
[im 85/101  soft-tissue]
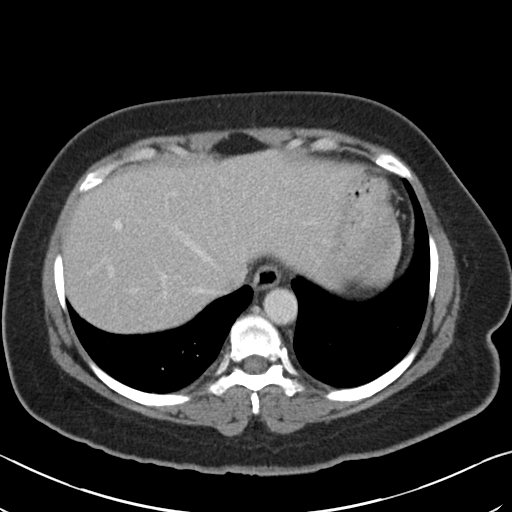
[im 95/101  soft-tissue]
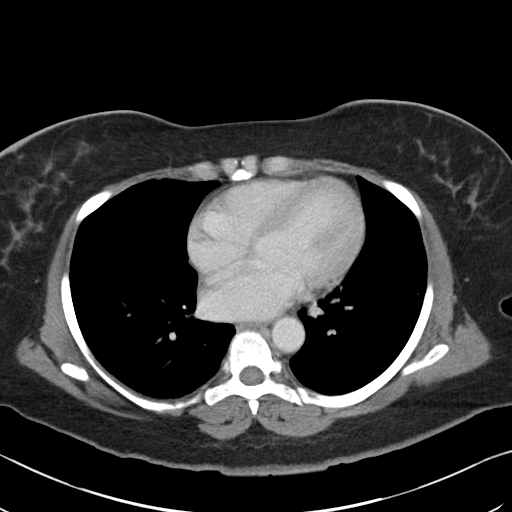

[Series 6: coronals · coronal · 0.72mm/px · 3 of 119 slices shown]
[im 40/119  soft-tissue]
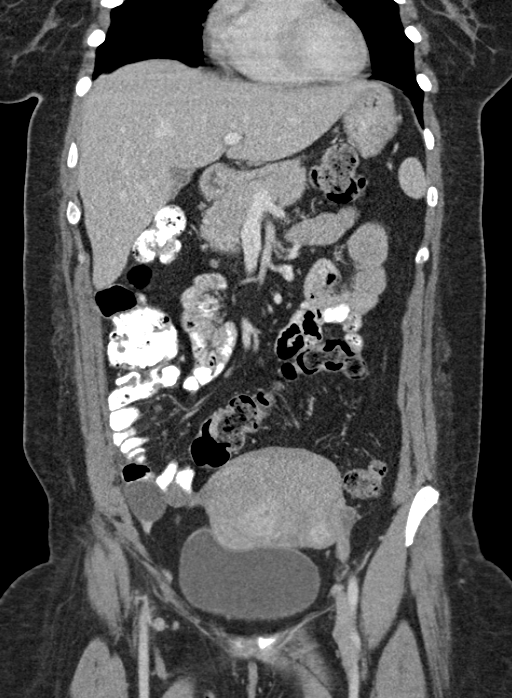
[im 53/119  soft-tissue]
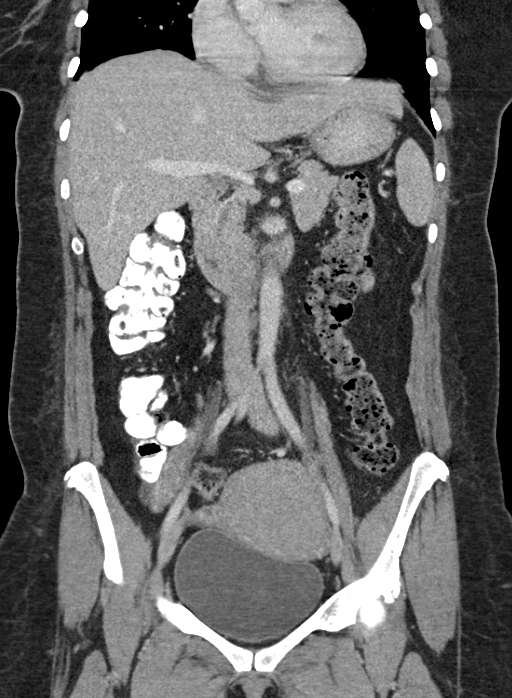
[im 66/119  soft-tissue]
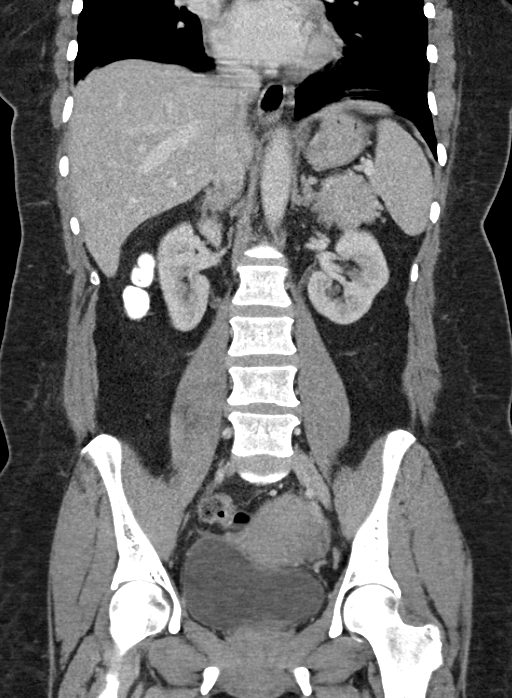

[15 of 46 positions shown; findings below may reference images not displayed]

FINDINGS: On axial slice 7, there is a 6 mm nodular opacity in the anterior
segment of the left lower lobe. On axial slice 2, there is a 2 mm
nodular opacity in the anterior segment of the left lower lobe.
There is no airspace consolidation in the lung bases.

There is fatty infiltration in the liver. Beyond fatty infiltration,
there are no focal liver lesions. There is no biliary duct
dilatation. Gallbladder wall does not appear appreciably thickened.

There is a small calcified granuloma in the posterior spleen. Spleen
otherwise appears normal. Pancreas and adrenals appear normal.

Kidneys bilaterally show no appreciable mass or hydronephrosis on
either side. No renal or ureteral calculus is seen on either side.

In the pelvis, the ureter is enlarged with multiple lobulations and
mass lesions. There is a dominant mass within the uterus measuring
6.0 x 5.2 cm. There is stranding in the soft tissues adjacent to the
rectum with some thickening of the wall of the rectum. There is a
small amount of loculated fluid immediately adjacent to the proximal
sigmoid colon, probably representing some localized colonic
inflammation, likely diverticulitis. A well-defined diverticulum is
not seen in this area, however. There is no pelvic mass seen.
Appendix appears normal.

There is no bowel obstruction. There is no free air or portal venous
air.

There is no adenopathy or abscess in the abdomen or pelvis. Aorta is
non aneurysmal. There are no blastic or lytic bone lesions.
IMPRESSION: There is localized inflammation immediately adjacent to the proximal
sigmoid colon anteriorly with localized loculated fluid in this
area. Suspect a small area of localized diverticulitis.

There is perirectal soft tissue stranding in mild rectal thickening.
Suspect a degree of prostatitis.

Enlarged uterus with presumed leiomyomas throughout the uterus.

Appendix appears normal. No renal or ureteral calculus. No
hydronephrosis.

Fatty infiltration in the liver.

Nodular opacities in the left lung base, largest measuring 6 mm.
Followup should be based on [HOSPITAL] guidelines. If the
patient is at high risk for bronchogenic carcinoma, follow-up chest
CT at 6-12 months is recommended. If the patient is at low risk for
bronchogenic carcinoma, follow-up chest CT at 12 months is
recommended. This recommendation follows the consensus statement:
Guidelines for Management of Small Pulmonary Nodules Detected on CT
Scans: A Statement from the [HOSPITAL] as published in

## 2016-03-15 ENCOUNTER — Emergency Department (HOSPITAL_COMMUNITY): Payer: Self-pay

## 2016-03-15 ENCOUNTER — Encounter (HOSPITAL_COMMUNITY): Payer: Self-pay | Admitting: Emergency Medicine

## 2016-03-15 ENCOUNTER — Emergency Department (HOSPITAL_COMMUNITY)
Admission: EM | Admit: 2016-03-15 | Discharge: 2016-03-15 | Disposition: A | Discharge status: Home / Self Care | Payer: Self-pay | Attending: Emergency Medicine | Admitting: Emergency Medicine

## 2016-03-15 DIAGNOSIS — N83201 Unspecified ovarian cyst, right side: Secondary | ICD-10-CM | POA: Insufficient documentation

## 2016-03-15 DIAGNOSIS — Z792 Long term (current) use of antibiotics: Secondary | ICD-10-CM | POA: Insufficient documentation

## 2016-03-15 DIAGNOSIS — D259 Leiomyoma of uterus, unspecified: Secondary | ICD-10-CM | POA: Insufficient documentation

## 2016-03-15 DIAGNOSIS — R1031 Right lower quadrant pain: Secondary | ICD-10-CM

## 2016-03-15 DIAGNOSIS — N83202 Unspecified ovarian cyst, left side: Secondary | ICD-10-CM | POA: Insufficient documentation

## 2016-03-15 DIAGNOSIS — I10 Essential (primary) hypertension: Secondary | ICD-10-CM | POA: Insufficient documentation

## 2016-03-15 HISTORY — DX: Essential (primary) hypertension: I10

## 2016-03-15 LAB — COMPREHENSIVE METABOLIC PANEL
ALBUMIN: 3.4 g/dL — AB (ref 3.5–5.0)
ALK PHOS: 64 U/L (ref 38–126)
ALT: 15 U/L (ref 14–54)
ANION GAP: 9 (ref 5–15)
AST: 18 U/L (ref 15–41)
BUN: 13 mg/dL (ref 6–20)
CALCIUM: 9.2 mg/dL (ref 8.9–10.3)
CHLORIDE: 104 mmol/L (ref 101–111)
CO2: 26 mmol/L (ref 22–32)
Creatinine, Ser: 1.17 mg/dL — ABNORMAL HIGH (ref 0.44–1.00)
GFR calc non Af Amer: 53 mL/min — ABNORMAL LOW (ref >60)
Glucose, Bld: 100 mg/dL — ABNORMAL HIGH (ref 65–99)
POTASSIUM: 3.4 mmol/L — AB (ref 3.5–5.1)
Sodium: 139 mmol/L (ref 135–145)
Total Bilirubin: 0.6 mg/dL (ref 0.3–1.2)
Total Protein: 6.7 g/dL (ref 6.5–8.1)

## 2016-03-15 LAB — CBC WITH DIFFERENTIAL/PLATELET
BASOS PCT: 0 %
Basophils Absolute: 0 10*3/uL (ref 0.0–0.1)
EOS ABS: 0.2 10*3/uL (ref 0.0–0.7)
Eosinophils Relative: 3 %
HCT: 37.7 % (ref 36.0–46.0)
Hemoglobin: 12.4 g/dL (ref 12.0–15.0)
LYMPHS ABS: 3.9 10*3/uL (ref 0.7–4.0)
Lymphocytes Relative: 41 %
MCH: 23.6 pg — AB (ref 26.0–34.0)
MCHC: 32.9 g/dL (ref 30.0–36.0)
MCV: 71.7 fL — ABNORMAL LOW (ref 78.0–100.0)
MONO ABS: 0.7 10*3/uL (ref 0.1–1.0)
Monocytes Relative: 7 %
Neutro Abs: 4.7 10*3/uL (ref 1.7–7.7)
Neutrophils Relative %: 49 %
PLATELETS: 263 10*3/uL (ref 150–400)
RBC: 5.26 MIL/uL — ABNORMAL HIGH (ref 3.87–5.11)
RDW: 15.8 % — AB (ref 11.5–15.5)
WBC: 9.5 10*3/uL (ref 4.0–10.5)

## 2016-03-15 LAB — LIPASE, BLOOD: Lipase: 44 U/L (ref 11–51)

## 2016-03-15 LAB — URINALYSIS, ROUTINE W REFLEX MICROSCOPIC
BILIRUBIN URINE: NEGATIVE
Glucose, UA: NEGATIVE mg/dL
Hgb urine dipstick: NEGATIVE
KETONES UR: NEGATIVE mg/dL
LEUKOCYTES UA: NEGATIVE
NITRITE: NEGATIVE
Protein, ur: NEGATIVE mg/dL
SPECIFIC GRAVITY, URINE: 1.021 (ref 1.005–1.030)
pH: 6 (ref 5.0–8.0)

## 2016-03-15 LAB — WET PREP, GENITAL
CLUE CELLS WET PREP: NONE SEEN
Sperm: NONE SEEN
Trich, Wet Prep: NONE SEEN
Yeast Wet Prep HPF POC: NONE SEEN

## 2016-03-15 LAB — GC/CHLAMYDIA PROBE AMP (~~LOC~~) NOT AT ARMC
Chlamydia: NEGATIVE
Neisseria Gonorrhea: NEGATIVE

## 2016-03-15 MED ORDER — LOSARTAN POTASSIUM 50 MG PO TABS: 50.0000 mg | ORAL_TABLET | Freq: Every day | ORAL | Status: DC

## 2016-03-15 MED ORDER — SODIUM CHLORIDE 0.9 % IV BOLUS (SEPSIS)
1000.0000 mL | Freq: Once | INTRAVENOUS | Status: AC
  Administered 2016-03-15: 1000 mL via INTRAVENOUS

## 2016-03-15 MED ORDER — MORPHINE SULFATE (PF) 4 MG/ML IV SOLN
4.0000 mg | Freq: Once | INTRAVENOUS | Status: AC
  Administered 2016-03-15: 4 mg via INTRAVENOUS
  Filled 2016-03-15: qty 1

## 2016-03-15 MED ORDER — MORPHINE SULFATE (PF) 4 MG/ML IV SOLN
4.0000 mg | Freq: Once | INTRAVENOUS | Status: DC
  Filled 2016-03-15: qty 1

## 2016-03-15 MED ORDER — HYDROCODONE-ACETAMINOPHEN 5-325 MG PO TABS: 1.0000 | ORAL_TABLET | Freq: Four times a day (QID) | ORAL | Status: DC | PRN

## 2016-03-15 MED ORDER — LISINOPRIL 20 MG PO TABS: 10.0000 mg | ORAL_TABLET | Freq: Every day | ORAL | Status: DC

## 2016-03-15 MED ORDER — ONDANSETRON HCL 4 MG/2ML IJ SOLN
4.0000 mg | Freq: Once | INTRAMUSCULAR | Status: AC
  Administered 2016-03-15: 4 mg via INTRAVENOUS
  Filled 2016-03-15: qty 2

## 2016-03-15 MED FILL — LOSARTAN POTASSIUM 50 MG TA: 50 | 30 days supply | Qty: 30 | Fill #0

## 2016-03-15 NOTE — ED Notes (Signed)
Pt states she does not need to urinate at this time; pt given call bell and advised to use when she needed to toilet

## 2016-03-15 NOTE — ED Provider Notes (Addendum)
I have except a patient at signout to follow up on ultrasound results. Patient is having lower abdominal pain. Dr. Dina Rich has completed pelvic examination and CT scan. Patient has known fibroids. Plan was to rule out any ovarian torsion. Ultrasound shows no torsion. She does have fibroids as well as bilateral ovarian cysts.  Patient is alert and nontoxic. Vital signs are stable. She is well in appearance. Dr. Dina Rich has prescribed for Vicodin for pain control. She is counseled to follow-up with both her gynecologist and her family physician. She states she has recently lost her insurance due to job loss. Been unable to refill her blood pressure medications. She reports she cannot afford her Cozaar at this time. Patient is provided with a prescription for lisinopril for $4 that she can take until she is able to establish her care and financial medical assistance. Patient reports she has already scheduled a plan to try to obtain the orange card.  Nurse was able to get losartan filled for the patient through community resources thus we will change back to losartan.  Charlesetta Shanks, MD 03/15/16 [id number]  Charlesetta Shanks, MD 03/15/16 1000

## 2016-03-15 NOTE — ED Notes (Signed)
This RN was unable to return unopened morphine in pyxis.  This RN gave unopened 4mg  morphine to Elenor Quinones, pharmacist, with Jacklyn Shell, RN, as witness.

## 2016-03-15 NOTE — ED Provider Notes (Signed)
CSN: [id number]     Arrival date & time 03/15/16  [id number] History   First MD Initiated Contact with Patient 03/15/16 0515     Chief Complaint  Patient presents with  . Abdominal Pain     (Consider location/radiation/quality/duration/timing/severity/associated sxs/prior Treatment) HPI  This a 50 year old female with a history of hypertension who presents with right flank and right lower quadrant pain. Acute in onset. Started at 4:15 AM. It woke her from sleep. She describes it as sharp and nonradiating. Denies urinary symptoms, dysuria, hematuria. Current pain is 10 out of 10. She did not take anything for pain. Denies any nausea, vomiting, diarrhea.  Past Medical History  Diagnosis Date  . Hypertension    Past Surgical History  Procedure Laterality Date  . Tubal ligation     Family History  Problem Relation Age of Onset  . Cancer Maternal Grandmother     pancreatic   Social History  Substance Use Topics  . Smoking status: Never Smoker   . Smokeless tobacco: Never Used  . Alcohol Use: No   OB History    No data available     Review of Systems  Constitutional: Negative for fever.  Gastrointestinal: Positive for abdominal pain. Negative for nausea, vomiting, diarrhea and abdominal distention.  Genitourinary: Positive for flank pain. Negative for vaginal bleeding and vaginal pain.  All other systems reviewed and are negative.     Allergies  Review of patient's allergies indicates no known allergies.  Home Medications   Prior to Admission medications   Medication Sig Start Date End Date Taking? Authorizing Provider  ciprofloxacin (CIPRO) 500 MG tablet Take 1 tablet (500 mg total) by mouth 2 (two) times daily. 11/21/13   Linton Flemings, MD  metroNIDAZOLE (FLAGYL) 500 MG tablet Take 1 tablet (500 mg total) by mouth 3 (three) times daily. 11/21/13   Linton Flemings, MD   BP 171/85 mmHg  Pulse 62  Temp(Src) 97.9 F (36.6 C) (Oral)  Resp 17  Ht 5\' 6"  (1.676 m)  Wt 215 lb (97.523  kg)  BMI 34.72 kg/m2  SpO2 98%  LMP 02/13/2016 Physical Exam  Constitutional: She is oriented to person, place, and time. She appears well-developed and well-nourished. No distress.  Uncomfortable appearing  HENT:  Head: Normocephalic and atraumatic.  Cardiovascular: Normal rate, regular rhythm and normal heart sounds.   No murmur heard. Pulmonary/Chest: Effort normal and breath sounds normal. No respiratory distress. She has no wheezes.  Abdominal: Soft. Bowel sounds are normal. There is no tenderness. There is no rebound.  Genitourinary:  No CVA tenderness Right adnexal and uterine tenderness palpation, enlarged uterus noted, scant vaginal discharge  Neurological: She is alert and oriented to person, place, and time.  Skin: Skin is warm and dry.  Psychiatric: She has a normal mood and affect.  Nursing note and vitals reviewed.   ED Course  Procedures (including critical care time) Labs Review Labs Reviewed  CBC WITH DIFFERENTIAL/PLATELET - Abnormal; Notable for the following:    RBC 5.26 (*)    MCV 71.7 (*)    MCH 23.6 (*)    RDW 15.8 (*)    All other components within normal limits  COMPREHENSIVE METABOLIC PANEL - Abnormal; Notable for the following:    Potassium 3.4 (*)    Glucose, Bld 100 (*)    Creatinine, Ser 1.17 (*)    Albumin 3.4 (*)    GFR calc non Af Amer 53 (*)    All other components within normal limits  LIPASE, BLOOD  URINALYSIS, ROUTINE W REFLEX MICROSCOPIC (NOT AT [facility])    Imaging Review No results found. I have personally reviewed and evaluated these images and lab results as part of my medical decision-making.   EKG Interpretation None      MDM   Final diagnoses:  None   Patient presents with right flank and right lower quadrant pain. Acute in onset. She is otherwise nontoxic. She is uncomfortable appearing. She is given pain and nausea medication. Considerations include kidney stone, ovarian torsion, less likely appendicitis. Lab work  obtained. CT renal stone study obtained and is reassuring. Patient does have a history of fibroids and on pelvic exam has some right adnexal and uterine tenderness. Will obtain ultrasound. Signed out to Dr. Johnney Killian.     Merryl Hacker, MD 03/15/16 [phone]

## 2016-03-15 NOTE — ED Notes (Signed)
Patient transported to Ultrasound 

## 2016-03-15 NOTE — ED Notes (Signed)
RN called to request est scan time; CT tech states "in the next 30 mins or so"; MD and patient made aware of delay

## 2016-03-15 NOTE — ED Notes (Signed)
Pt also reports no HTN medication x 1 wk d/t being out of work

## 2016-03-15 NOTE — ED Notes (Signed)
This RN was unable to return unopened 4mg  morphine pyxis.  This RN returned 4mg  unopened morphine to Elenor Quinones, pharmacist, with Jacklyn Shell, RN, as witness.

## 2016-03-15 NOTE — Discharge Instructions (Signed)
Abdominal Pain, Adult Many things can cause abdominal pain. Usually, abdominal pain is not caused by a disease and will improve without treatment. It can often be observed and treated at home. Your health care provider will do a physical exam and possibly order blood tests and X-rays to help determine the seriousness of your pain. However, in many cases, more time must pass before a clear cause of the pain can be found. Before that point, your health care provider may not know if you need more testing or further treatment. HOME CARE INSTRUCTIONS Monitor your abdominal pain for any changes. The following actions may help to alleviate any discomfort you are experiencing:  Only take over-the-counter or prescription medicines as directed by your health care provider.  Do not take laxatives unless directed to do so by your health care provider.  Try a clear liquid diet (broth, tea, or water) as directed by your health care provider. Slowly move to a bland diet as tolerated. SEEK MEDICAL CARE IF:  You have unexplained abdominal pain.  You have abdominal pain associated with nausea or diarrhea.  You have pain when you urinate or have a bowel movement.  You experience abdominal pain that wakes you in the night.  You have abdominal pain that is worsened or improved by eating food.  You have abdominal pain that is worsened with eating fatty foods.  You have a fever. SEEK IMMEDIATE MEDICAL CARE IF:  Your pain does not go away within 2 hours.  You keep throwing up (vomiting).  Your pain is felt only in portions of the abdomen, such as the right side or the left lower portion of the abdomen.  You pass bloody or black tarry stools. MAKE SURE YOU:  Understand these instructions.  Will watch your condition.  Will get help right away if you are not doing well or get worse.   This information is not intended to replace advice given to you by your health care provider. Make sure you discuss  any questions you have with your health care provider.   Document Released: 07/05/2005 Document Revised: 06/16/2015 Document Reviewed: 06/04/2013 Elsevier Interactive Patient Education 2016 Elsevier Inc. Ovarian Cyst An ovarian cyst is a fluid-filled sac that forms on an ovary. The ovaries are small organs that produce eggs in women. Various types of cysts can form on the ovaries. Most are not cancerous. Many do not cause problems, and they often go away on their own. Some may cause symptoms and require treatment. Common types of ovarian cysts include:  Functional cysts--These cysts may occur every month during the menstrual cycle. This is normal. The cysts usually go away with the next menstrual cycle if the woman does not get pregnant. Usually, there are no symptoms with a functional cyst.  Endometrioma cysts--These cysts form from the tissue that lines the uterus. They are also called "chocolate cysts" because they become filled with blood that turns brown. This type of cyst can cause pain in the lower abdomen during intercourse and with your menstrual period.  Cystadenoma cysts--This type develops from the cells on the outside of the ovary. These cysts can get very big and cause lower abdomen pain and pain with intercourse. This type of cyst can twist on itself, cut off its blood supply, and cause severe pain. It can also easily rupture and cause a lot of pain.  Dermoid cysts--This type of cyst is sometimes found in both ovaries. These cysts may contain different kinds of body tissue, such as  skin, teeth, hair, or cartilage. They usually do not cause symptoms unless they get very big.  Theca lutein cysts--These cysts occur when too much of a certain hormone (human chorionic gonadotropin) is produced and overstimulates the ovaries to produce an egg. This is most common after procedures used to assist with the conception of a baby (in vitro fertilization). CAUSES   Fertility drugs can cause a  condition in which multiple large cysts are formed on the ovaries. This is called ovarian hyperstimulation syndrome.  A condition called polycystic ovary syndrome can cause hormonal imbalances that can lead to nonfunctional ovarian cysts. SIGNS AND SYMPTOMS  Many ovarian cysts do not cause symptoms. If symptoms are present, they may include:  Pelvic pain or pressure.  Pain in the lower abdomen.  Pain during sexual intercourse.  Increasing girth (swelling) of the abdomen.  Abnormal menstrual periods.  Increasing pain with menstrual periods.  Stopping having menstrual periods without being pregnant. DIAGNOSIS  These cysts are commonly found during a routine or annual pelvic exam. Tests may be ordered to find out more about the cyst. These tests may include:  Ultrasound.  X-ray of the pelvis.  CT scan.  MRI.  Blood tests. TREATMENT  Many ovarian cysts go away on their own without treatment. Your health care provider may want to check your cyst regularly for 2-3 months to see if it changes. For women in menopause, it is particularly important to monitor a cyst closely because of the higher rate of ovarian cancer in menopausal women. When treatment is needed, it may include any of the following:  A procedure to drain the cyst (aspiration). This may be done using a long needle and ultrasound. It can also be done through a laparoscopic procedure. This involves using a thin, lighted tube with a tiny camera on the end (laparoscope) inserted through a small incision.  Surgery to remove the whole cyst. This may be done using laparoscopic surgery or an open surgery involving a larger incision in the lower abdomen.  Hormone treatment or birth control pills. These methods are sometimes used to help dissolve a cyst. HOME CARE INSTRUCTIONS   Only take over-the-counter or prescription medicines as directed by your health care provider.  Follow up with your health care provider as  directed.  Get regular pelvic exams and Pap tests. SEEK MEDICAL CARE IF:   Your periods are late, irregular, or painful, or they stop.  Your pelvic pain or abdominal pain does not go away.  Your abdomen becomes larger or swollen.  You have pressure on your bladder or trouble emptying your bladder completely.  You have pain during sexual intercourse.  You have feelings of fullness, pressure, or discomfort in your stomach.  You lose weight for no apparent reason.  You feel generally ill.  You become constipated.  You lose your appetite.  You develop acne.  You have an increase in body and facial hair.  You are gaining weight, without changing your exercise and eating habits.  You think you are pregnant. SEEK IMMEDIATE MEDICAL CARE IF:   You have increasing abdominal pain.  You feel sick to your stomach (nauseous), and you throw up (vomit).  You develop a fever that comes on suddenly.  You have abdominal pain during a bowel movement.  Your menstrual periods become heavier than usual. MAKE SURE YOU:  Understand these instructions.  Will watch your condition.  Will get help right away if you are not doing well or get worse.  This information is not intended to replace advice given to you by your health care provider. Make sure you discuss any questions you have with your health care provider.   Document Released: 09/25/2005 Document Revised: 09/30/2013 Document Reviewed: 06/02/2013 Elsevier Interactive Patient Education 2016 Elsevier Inc. Uterine Fibroids Uterine fibroids are tissue masses (tumors) that can develop in the womb (uterus). They are also called leiomyomas. This type of tumor is not cancerous (benign) and does not spread to other parts of the body outside of the pelvic area, which is between the hip bones. Occasionally, fibroids may develop in the fallopian tubes, in the cervix, or on the support structures (ligaments) that surround the uterus. You  can have one or many fibroids. Fibroids can vary in size, weight, and where they grow in the uterus. Some can become quite large. Most fibroids do not require medical treatment. CAUSES A fibroid can develop when a single uterine cell keeps growing (replicating). Most cells in the human body have a control mechanism that keeps them from replicating without control. SIGNS AND SYMPTOMS Symptoms may include:   Heavy bleeding during your period.  Bleeding or spotting between periods.  Pelvic pain and pressure.  Bladder problems, such as needing to urinate more often (urinary frequency) or urgently.  Inability to reproduce offspring (infertility).  Miscarriages. DIAGNOSIS Uterine fibroids are diagnosed through a physical exam. Your health care provider may feel the lumpy tumors during a pelvic exam. Ultrasonography and an MRI may be done to determine the size, location, and number of fibroids. TREATMENT Treatment may include:  Watchful waiting. This involves getting the fibroid checked by your health care provider to see if it grows or shrinks. Follow your health care provider's recommendations for how often to have this checked.  Hormone medicines. These can be taken by mouth or given through an intrauterine device (IUD).  Surgery.  Removing the fibroids (myomectomy) or the uterus (hysterectomy).  Removing blood supply to the fibroids (uterine artery embolization). If fibroids interfere with your fertility and you want to become pregnant, your health care provider may recommend having the fibroids removed.  HOME CARE INSTRUCTIONS  Keep all follow-up visits as directed by your health care provider. This is important.  Take medicines only as directed by your health care provider.  If you were prescribed a hormone treatment, take the hormone medicines exactly as directed.  Do not take aspirin, because it can cause bleeding.  Ask your health care provider about taking iron pills and  increasing the amount of dark green, leafy vegetables in your diet. These actions can help to boost your blood iron levels, which may be affected by heavy menstrual bleeding.  Pay close attention to your period and tell your health care provider about any changes, such as:  Increased blood flow that requires you to use more pads or tampons than usual per month.  A change in the number of days that your period lasts per month.  A change in symptoms that are associated with your period, such as abdominal cramping or back pain. SEEK MEDICAL CARE IF:  You have pelvic pain, back pain, or abdominal cramps that cannot be controlled with medicines.  You have an increase in bleeding between and during periods.  You soak tampons or pads in a half hour or less.  You feel lightheaded, extra tired, or weak. SEEK IMMEDIATE MEDICAL CARE IF:  You faint.  You have a sudden increase in pelvic pain.   This information is not intended to  replace advice given to you by your health care provider. Make sure you discuss any questions you have with your health care provider.   Document Released: 09/22/2000 Document Revised: 10/16/2014 Document Reviewed: 03/24/2014 Elsevier Interactive Patient Education [employer].

## 2016-03-15 NOTE — ED Notes (Signed)
Pt presents with RLQ pain that began at 0415 today; pt states woke her up from sleep; denies n/v/d, fever, urinary symptoms; pt states pain is constant

## 2016-04-10 ENCOUNTER — Ambulatory Visit: Payer: PRIVATE HEALTH INSURANCE | Admitting: Family Medicine

## 2016-04-14 ENCOUNTER — Encounter: Payer: Self-pay | Admitting: *Deleted

## 2016-04-17 MED FILL — LOSARTAN POTASSIUM 50 MG TA: 50 | 30 days supply | Qty: 30 | Fill #1

## 2016-05-12 ENCOUNTER — Ambulatory Visit (INDEPENDENT_AMBULATORY_CARE_PROVIDER_SITE_OTHER): Payer: Self-pay | Admitting: Family Medicine

## 2016-05-12 ENCOUNTER — Encounter: Payer: Self-pay | Admitting: Family Medicine

## 2016-05-12 VITALS — BP 188/92 | HR 62 | Temp 98.2°F | Resp 16 | Ht 66.0 in | Wt 227.0 lb

## 2016-05-12 DIAGNOSIS — I10 Essential (primary) hypertension: Secondary | ICD-10-CM

## 2016-05-12 MED ORDER — LOSARTAN POTASSIUM 100 MG PO TABS: 100.0000 mg | ORAL_TABLET | Freq: Every day | ORAL | 0 refills | Status: DC

## 2016-05-12 MED FILL — ?LOSARTAN POTASSIUM 100 MG: 100 | 30 days supply | Qty: 30 | Fill #0

## 2016-05-12 NOTE — Patient Instructions (Signed)
Return in 3 months if you have not re-established with your previous doctor.  We have sent your prescription to [facility] and [place] at [place].

## 2016-05-12 NOTE — Progress Notes (Signed)
Mia Davis, is a 50 y.o. female  [id number]  [id number]  DOB - 1966-07-07  CC:  Chief Complaint  Patient presents with  . Establish Care  . Hypertension       HPI: Mia Davis is a 50 y.o. female here to establish care. He has a history of hypertension and is on Losartan 50 mg q day. She was recently seen in ED for hypertension as she currently has no insurance and cannot see her ususal doctor and cannot affort her medications. She was sent here to establish care so we can help her get her medications. She does not plan to stay here as a patient long term. The Losartan is her only medication at this time. She has had bloodwork through the ED within the last couple of months.   No Known Allergies Past Medical History:  Diagnosis Date  . Hypertension    Current Outpatient Prescriptions on File Prior to Visit  Medication Sig Dispense Refill  . HYDROcodone-acetaminophen (NORCO/VICODIN) 5-325 MG tablet Take 1 tablet by mouth every 6 (six) hours as needed. (Patient not taking: Reported on 05/12/2016) 8 tablet 0  . MICROGESTIN FE 1/20 1-20 MG-MCG tablet Take 1 tablet by mouth daily.   11  . [DISCONTINUED] lisinopril (PRINIVIL,ZESTRIL) 20 MG tablet Take 0.5 tablets (10 mg total) by mouth daily. 30 tablet 0   No current facility-administered medications on file prior to visit.    Family History  Problem Relation Age of Onset  . Cancer Maternal Grandmother     pancreatic   Social History   Social History  . Marital status: Single    Spouse name: N/A  . Number of children: N/A  . Years of education: N/A   Occupational History  . Not on file.   Social History Main Topics  . Smoking status: Never Smoker  . Smokeless tobacco: Never Used  . Alcohol use No  . Drug use: No  . Sexual activity: Not on file   Other Topics Concern  . Not on file   Social History Narrative  . No narrative on file    Review of Systems: Constitutional: Negative for fever, chills,  appetite change, weight loss. Positive for fatigue Skin: Negative for rashes or lesions of concern. HENT: Negative for ear pain, ear discharge.nose bleeds Eyes: Negative for pain, discharge, redness, itching and visual disturbance. Neck: Negative for pain, stiffness Respiratory: Negative for cough, shortness of breath,   Cardiovascular: Negative for chest pain, palpitations and leg swelling. Gastrointestinal: Negative for abdominal pain, nausea, vomiting, diarrhea, constipations Genitourinary: Negative for dysuria, urgency, frequency, hematuria,  Musculoskeletal: Negative for back pain, joint pain, joint  swelling, and gait problem.Negative for weakness. Neurological: Negative for dizziness, tremors, seizures, syncope,   light-headedness, numbness and headaches.  Hematological: Negative for easy bruising or bleeding Psychiatric/Behavioral: Negative for depression, anxiety, decreased concentration, confusion   Objective:   Vitals:   05/12/16 1050  BP: (!) 188/92  Pulse: 62  Resp: 16  Temp: 98.2 F (36.8 C)    Physical Exam: Constitutional: Patient appears well-developed and well-nourished. No distress. HENT: Normocephalic, atraumatic, External right and left ear normal. Oropharynx is clear and moist.  Eyes: Conjunctivae and EOM are normal. PERRLA, no scleral icterus. Neck: Normal ROM. Neck supple. No lymphadenopathy, No thyromegaly. CVS: RRR, S1/S2 +, no murmurs, no gallops, no rubs Pulmonary: Effort and breath sounds normal, no stridor, rhonchi, wheezes, rales.  Abdominal: Soft. Normoactive BS,, no distension, tenderness, rebound or guarding.  Musculoskeletal: Normal range of motion.  No edema and no tenderness.  Neuro: Alert.Normal muscle tone coordination. Non-focal Skin: Skin is warm and dry. No rash noted. Not diaphoretic. No erythema. No pallor. Psychiatric: Normal mood and affect. Behavior, judgment, thought content normal.  Lab Results  Component Value Date   WBC 9.5  03/15/2016   HGB 12.4 03/15/2016   HCT 37.7 03/15/2016   MCV 71.7 (L) 03/15/2016   PLT 263 03/15/2016   Lab Results  Component Value Date   CREATININE 1.17 (H) 03/15/2016   BUN 13 03/15/2016   NA 139 03/15/2016   K 3.4 (L) 03/15/2016   CL 104 03/15/2016   CO2 26 03/15/2016    No results found for: HGBA1C Lipid Panel  No results found for: CHOL, TRIG, HDL, CHOLHDL, VLDL, LDLCALC     Assessment and plan:   1. Essential hypertension  Losartan 100 mg, #90 with no refills. If she has not reinstated her insurance and gotten back with her PCP, she should return just prior to running out of Losartan. Was sent to [facility].   No Follow-up on file.  The patient was given clear instructions to go to ER or return to medical center if symptoms don't improve, worsen or new problems develop. The patient verbalized understanding.    Micheline Chapman FNP  05/12/2016, 12:43 PM

## 2016-06-01 MED FILL — AMOXICILLIN 500 MG CAPSULE: 500 | 7 days supply | Qty: 21 | Fill #0

## 2016-06-08 ENCOUNTER — Encounter: Payer: Self-pay | Admitting: Obstetrics & Gynecology

## 2016-06-08 ENCOUNTER — Ambulatory Visit (INDEPENDENT_AMBULATORY_CARE_PROVIDER_SITE_OTHER): Payer: Self-pay | Admitting: Obstetrics & Gynecology

## 2016-06-08 DIAGNOSIS — D259 Leiomyoma of uterus, unspecified: Secondary | ICD-10-CM | POA: Insufficient documentation

## 2016-06-08 MED ORDER — MEDROXYPROGESTERONE ACETATE 10 MG PO TABS: 20.0000 mg | ORAL_TABLET | Freq: Every day | ORAL | 2 refills | Status: DC

## 2016-06-08 NOTE — Progress Notes (Signed)
Patient ID: Mia Davis, female   DOB: [birth year], 50 y.o.   MRN: [id number]  Chief Complaint  Patient presents with  . Gynecologic Exam  heavy painful menses with fibroids  HPI Mia Davis is a 50 y.o. female.  No obstetric history on file. Patient's last menstrual period was 05/27/2016 (exact date). Former patient of Dr Margie Billet who has lost her insurance, transferring here for care. Had been evaluated to have TAH for large fibroid uterus.  HPI  Past Medical History:  Diagnosis Date  . Hypertension     Past Surgical History:  Procedure Laterality Date  . TUBAL LIGATION      Family History  Problem Relation Age of Onset  . Cancer Maternal Grandmother     pancreatic    Social History Social History  Substance Use Topics  . Smoking status: Never Smoker  . Smokeless tobacco: Never Used  . Alcohol use No    No Known Allergies  Current Outpatient Prescriptions  Medication Sig Dispense Refill  . amoxicillin (AMOXIL) 500 MG capsule Take 500 mg by mouth 3 (three) times daily.    Marland Kitchen ibuprofen (ADVIL,MOTRIN) 200 MG tablet Take 200 mg by mouth every 6 (six) hours as needed.    Marland Kitchen losartan (COZAAR) 100 MG tablet Take 1 tablet (100 mg total) by mouth daily. 90 tablet 0  . HYDROcodone-acetaminophen (NORCO/VICODIN) 5-325 MG tablet Take 1 tablet by mouth every 6 (six) hours as needed. (Patient not taking: Reported on 05/12/2016) 8 tablet 0  . MICROGESTIN FE 1/20 1-20 MG-MCG tablet Take 1 tablet by mouth daily.   11   No current facility-administered medications for this visit.     Review of Systems Review of Systems  Constitutional: Negative.   Respiratory: Negative.   Gastrointestinal: Positive for abdominal pain.  Genitourinary: Positive for menstrual problem and pelvic pain. Negative for dysuria, vaginal bleeding and vaginal discharge.    Blood pressure (!) 165/100, pulse 68, weight 231 lb 6.4 oz (105 kg), last menstrual period 05/27/2016.  Physical Exam Physical  Exam  Constitutional: She is oriented to person, place, and time. She appears well-developed. No distress.  Cardiovascular: Normal rate.   Pulmonary/Chest: Effort normal. No respiratory distress.  Genitourinary: Vagina normal. No vaginal discharge found.  Genitourinary Comments: Uterus 14-16 week size firm  Neurological: She is alert and oriented to person, place, and time.  Skin: Skin is warm and dry. No pallor.  Psychiatric: She has a normal mood and affect. Her behavior is normal.  Vitals reviewed.   Data Reviewed CLINICAL DATA:  Onset of acute right lower quadrant pain at 4:30 a.m. today; history of bilateral tubal ligation ; onset of last normal menstrual period was Feb 20, 2016.  EXAM: TRANSABDOMINAL AND TRANSVAGINAL ULTRASOUND OF PELVIS  DOPPLER ULTRASOUND OF OVARIES  TECHNIQUE: Both transabdominal and transvaginal ultrasound examinations of the pelvis were performed. Transabdominal technique was performed for global imaging of the pelvis including uterus, ovaries, adnexal regions, and pelvic cul-de-sac.  It was necessary to proceed with endovaginal exam following the transabdominal exam to visualize the uterus, endometrium, and ovaries. Color and duplex Doppler ultrasound was utilized to evaluate blood flow to the ovaries.  COMPARISON:  CT scan of the abdomen and pelvis of March 15, 2016  FINDINGS: Uterus  Measurements: 16.4 x 8.2 x 10.1 cm. There are 3 fibroids observed. The largest lies in the mid uterine body to the left of midline and measures 7 x 5.6 x 6.6 cm. Just above this on the left  a fibroid measures 5.1 x 2.9 x 3.5 cm. In the uterine fundus a fibroid measures 4.7 x 3.9 x 6.6 cm.  Endometrium  Thickness: The endometrial stripe could not be adequately measured or visualized. No abnormal endometrial fluid collections were observed.  Right ovary  Measurements: 3.1 x 1.5 x 3.3 cm. There is a paraovarian cyst measuring 3.5 x 2.5 x 3.1  cm.  Left ovary  Measurements: 3.8 x 2.6 x 4.3 cm. There is a 2.7 cm simple appearing cyst with a mixed echogenicity adjacent area measuring 1.8 x 1.7 x 1.9 cm.  Pulsed Doppler evaluation of both ovaries demonstrates normal low-resistance arterial and venous waveforms.  Other findings  No abnormal free fluid.  IMPRESSION: 1. Multiple large uterine fibroids. The endometrial stripe could not be adequately demonstrated. 2. Simple appearing cysts in both ovaries. The right ovarian cystic structure measures 3.5 cm in greatest dimension. In addition to the 2.7 cm simple appearing cyst the left ovary demonstrates a complex appearing structure measuring 1.8 x 1.7 x 1.9 cm. This may reflect an hemorrhagic cyst or endometrioma. Follow-up ultrasound in 8-12 weeks is recommended to reassess the left ovary. 3. No free pelvic fluid.   Electronically Signed   By: David  Martinique M.D.   On: 03/15/2016 09:03 CBC Latest Ref Rng & Units 03/15/2016 11/21/2013  WBC 4.0 - 10.5 K/uL 9.5 11.0(H)  Hemoglobin 12.0 - 15.0 g/dL 12.4 9.2(L)  Hematocrit 36.0 - 46.0 % 37.7 26.9(L)  Platelets 150 - 400 K/uL 263 253    Assessment    Fibroid uterus  Menorrhagia Normal EMB this year, pap normal 09/2014  Candidate for TAH  Plan    Financial assistance Provera 20 mg daily, needs Lupron depot RTC 4 weeks to schedule TAH       [name] 06/08/2016, 9:35 AM

## 2016-06-15 MED FILL — ?LOSARTAN POTASSIUM 100 MG: 100 | 30 days supply | Qty: 30 | Fill #1

## 2016-06-27 ENCOUNTER — Encounter: Payer: Self-pay | Admitting: Obstetrics & Gynecology

## 2016-06-27 ENCOUNTER — Telehealth: Payer: Self-pay | Admitting: Obstetrics & Gynecology

## 2016-07-10 ENCOUNTER — Other Ambulatory Visit: Payer: Self-pay | Admitting: Family Medicine

## 2016-07-10 ENCOUNTER — Ambulatory Visit (INDEPENDENT_AMBULATORY_CARE_PROVIDER_SITE_OTHER): Payer: Self-pay | Admitting: Family Medicine

## 2016-07-10 VITALS — BP 171/89 | HR 67

## 2016-07-10 DIAGNOSIS — I10 Essential (primary) hypertension: Secondary | ICD-10-CM

## 2016-07-10 MED ORDER — HYDROCHLOROTHIAZIDE 25 MG PO TABS: 25.0000 mg | ORAL_TABLET | Freq: Every day | ORAL | 1 refills | Status: DC

## 2016-07-10 MED ORDER — AMLODIPINE BESYLATE 10 MG PO TABS: 10.0000 mg | ORAL_TABLET | Freq: Every day | ORAL | 1 refills | Status: DC

## 2016-07-10 MED FILL — AMLODIPINE BESYLATE 10 MG T: 10 | 30 days supply | Qty: 30 | Fill #0

## 2016-07-10 MED FILL — HYDROCHLOROTHIAZIDE 25 MG T: 25 | 30 days supply | Qty: 30 | Fill #0

## 2016-07-21 ENCOUNTER — Other Ambulatory Visit: Payer: Self-pay | Admitting: Family Medicine

## 2016-07-21 MED FILL — LOSARTAN POTASSIUM 100 MG T: 100 | 30 days supply | Qty: 30 | Fill #0

## 2016-08-10 ENCOUNTER — Ambulatory Visit: Payer: Self-pay | Admitting: Family Medicine

## 2016-08-11 ENCOUNTER — Encounter: Payer: Self-pay | Admitting: Family Medicine

## 2016-08-11 ENCOUNTER — Ambulatory Visit (INDEPENDENT_AMBULATORY_CARE_PROVIDER_SITE_OTHER): Payer: Self-pay | Admitting: Family Medicine

## 2016-08-11 VITALS — BP 138/72 | HR 69 | Temp 98.6°F | Resp 18 | Ht 66.0 in | Wt 234.0 lb

## 2016-08-11 DIAGNOSIS — I10 Essential (primary) hypertension: Secondary | ICD-10-CM

## 2016-08-11 LAB — CBC WITH DIFFERENTIAL/PLATELET
BASOS ABS: 0 {cells}/uL (ref 0–200)
Basophils Relative: 0 %
EOS ABS: 142 {cells}/uL (ref 15–500)
Eosinophils Relative: 2 %
HEMATOCRIT: 35.9 % (ref 35.0–45.0)
Hemoglobin: 11.4 g/dL — ABNORMAL LOW (ref 11.7–15.5)
LYMPHS PCT: 41 %
Lymphs Abs: 2911 cells/uL (ref 850–3900)
MCH: 22.7 pg — AB (ref 27.0–33.0)
MCHC: 31.8 g/dL — ABNORMAL LOW (ref 32.0–36.0)
MCV: 71.5 fL — AB (ref 80.0–100.0)
MONO ABS: 426 {cells}/uL (ref 200–950)
MPV: 9.8 fL (ref 7.5–12.5)
Monocytes Relative: 6 %
NEUTROS ABS: 3621 {cells}/uL (ref 1500–7800)
Neutrophils Relative %: 51 %
Platelets: 264 10*3/uL (ref 140–400)
RBC: 5.02 MIL/uL (ref 3.80–5.10)
RDW: 16.5 % — ABNORMAL HIGH (ref 11.0–15.0)
WBC: 7.1 10*3/uL (ref 3.8–10.8)

## 2016-08-11 LAB — HEMOGLOBIN A1C
HEMOGLOBIN A1C: 5.4 % (ref <5.7)
Mean Plasma Glucose: 108 mg/dL

## 2016-08-11 LAB — TSH: TSH: 0.81 mIU/L

## 2016-08-11 NOTE — Progress Notes (Signed)
Patient is here for FU BP  Patient has taken medication today. Patient has eaten today.  Patient denies pain at this time.  Patient declined flu vaccine today.

## 2016-08-12 LAB — COMPLETE METABOLIC PANEL WITH GFR
ALK PHOS: 68 U/L (ref 33–130)
ALT: 13 U/L (ref 6–29)
AST: 15 U/L (ref 10–35)
Albumin: 3.9 g/dL (ref 3.6–5.1)
BILIRUBIN TOTAL: 0.7 mg/dL (ref 0.2–1.2)
BUN: 14 mg/dL (ref 7–25)
CALCIUM: 9 mg/dL (ref 8.6–10.4)
CO2: 24 mmol/L (ref 20–31)
CREATININE: 1.1 mg/dL — AB (ref 0.50–1.05)
Chloride: 106 mmol/L (ref 98–110)
GFR, EST AFRICAN AMERICAN: 67 mL/min (ref >=60)
GFR, EST NON AFRICAN AMERICAN: 58 mL/min — AB (ref >=60)
Glucose, Bld: 90 mg/dL (ref 65–99)
Potassium: 3.9 mmol/L (ref 3.5–5.3)
Sodium: 138 mmol/L (ref 135–146)
TOTAL PROTEIN: 7 g/dL (ref 6.1–8.1)

## 2016-08-12 LAB — LIPID PANEL
CHOLESTEROL: 139 mg/dL (ref 125–200)
HDL: 49 mg/dL (ref >=46)
LDL CALC: 75 mg/dL (ref <130)
TRIGLYCERIDES: 76 mg/dL (ref <150)
Total CHOL/HDL Ratio: 2.8 Ratio (ref <=5.0)
VLDL: 15 mg/dL (ref <30)

## 2016-08-14 MED FILL — AMLODIPINE BESYLATE 10 MG T: 10 | 30 days supply | Qty: 30 | Fill #1

## 2016-08-14 MED FILL — LOSARTAN POTASSIUM 100 MG T: 100 | 30 days supply | Qty: 30 | Fill #1

## 2016-08-15 NOTE — Progress Notes (Signed)
Mia Davis, is a 50 y.o. female  [id number]  [id number]  DOB - 1966-07-24  CC:  Chief Complaint  Patient presents with  . Hypertension       HPI: Mia Davis is a 50 y.o. female here for follow-up hypertension. She reports doing well since last visit. She is taking her medications regularly and following a low salt diet. She is on amlodipine 10 mg. And losartan 100 for hypertension. BP today 138/72.   Health maintenance: Declines influenza. She reports her Mammogram and PAP are current. She reports having a colonoscopy 2 years ago.    No Known Allergies Past Medical History:  Diagnosis Date  . Hypertension    Current Outpatient Prescriptions on File Prior to Visit  Medication Sig Dispense Refill  . amLODipine (NORVASC) 10 MG tablet Take 1 tablet (10 mg total) by mouth daily. 90 tablet 1  . HYDROcodone-acetaminophen (NORCO/VICODIN) 5-325 MG tablet Take 1 tablet by mouth every 6 (six) hours as needed. 8 tablet 0  . ibuprofen (ADVIL,MOTRIN) 200 MG tablet Take 200 mg by mouth every 6 (six) hours as needed.    Marland Kitchen losartan (COZAAR) 100 MG tablet TAKE 1 TABLET BY MOUTH DAILY. 90 tablet 0  . medroxyPROGESTERone (PROVERA) 10 MG tablet Take 2 tablets (20 mg total) by mouth daily. (Patient not taking: Reported on 08/11/2016) 30 tablet 2  . MICROGESTIN FE 1/20 1-20 MG-MCG tablet Take 1 tablet by mouth daily.   11  . [DISCONTINUED] lisinopril (PRINIVIL,ZESTRIL) 20 MG tablet Take 0.5 tablets (10 mg total) by mouth daily. 30 tablet 0   No current facility-administered medications on file prior to visit.    Family History  Problem Relation Age of Onset  . Cancer Maternal Grandmother     pancreatic   Social History   Social History  . Marital status: Single    Spouse name: N/A  . Number of children: N/A  . Years of education: N/A   Occupational History  . Not on file.   Social History Main Topics  . Smoking status: Never Smoker  . Smokeless tobacco: Never Used  .  Alcohol use No  . Drug use: No  . Sexual activity: Not on file   Other Topics Concern  . Not on file   Social History Narrative  . No narrative on file    Review of Systems: Constitutional: + for fatigue Skin: Negative HENT: Negative  Eyes: Negative  Neck: Negative Respiratory: Negative Cardiovascular: Negative Gastrointestinal: Negative Genitourinary: Negative  Musculoskeletal: Negative   Neurological: Negative for Hematological: + for easy bruising Psychiatric/Behavioral: Negative    Objective:   Vitals:   08/11/16 1540  BP: 138/72  Pulse: 69  Resp: 18  Temp: 98.6 F (37 C)    Physical Exam: Constitutional: Patient appears well-developed and well-nourished. No distress. HENT: Normocephalic, atraumatic, External right and left ear normal. Oropharynx is clear and moist.  Eyes: Conjunctivae and EOM are normal. PERRLA, no scleral icterus. Neck: Normal ROM. Neck supple. No lymphadenopathy, No thyromegaly. CVS: RRR, S1/S2 +, no murmurs, no gallops, no rubs Pulmonary: Effort and breath sounds normal, no stridor, rhonchi, wheezes, rales.  Abdominal: Soft. Normoactive BS,, no distension, tenderness, rebound or guarding.  Musculoskeletal: Normal range of motion. No edema and no tenderness.  Neuro: Alert.Normal muscle tone coordination. Non-focal Skin: Skin is warm and dry. No rash noted. Not diaphoretic. No erythema. No pallor. Psychiatric: Normal mood and affect. Behavior, judgment, thought content normal.  Lab Results  Component Value Date   WBC  7.1 08/11/2016   HGB 11.4 (L) 08/11/2016   HCT 35.9 08/11/2016   MCV 71.5 (L) 08/11/2016   PLT 264 08/11/2016   Lab Results  Component Value Date   CREATININE 1.10 (H) 08/11/2016   BUN 14 08/11/2016   NA 138 08/11/2016   K 3.9 08/11/2016   CL 106 08/11/2016   CO2 24 08/11/2016    Lab Results  Component Value Date   HGBA1C 5.4 08/11/2016   Lipid Panel     Component Value Date/Time   CHOL 139 08/11/2016 1606    TRIG 76 08/11/2016 1606   HDL 49 08/11/2016 1606   CHOLHDL 2.8 08/11/2016 1606   VLDL 15 08/11/2016 1606   LDLCALC 75 08/11/2016 1606        Assessment and plan:   1. Essential hypertension  - Hemoglobin A1c - CBC with Differential - COMPLETE METABOLIC PANEL WITH GFR - Lipid panel - TSH   Return in about 6 months (around 02/08/2017).  The patient was given clear instructions to go to ER or return to medical center if symptoms don't improve, worsen or new problems develop. The patient verbalized understanding.    Micheline Chapman FNP  08/15/2016, 4:23 PM

## 2016-08-15 NOTE — Patient Instructions (Signed)
Continue current medications. 

## 2016-08-16 ENCOUNTER — Ambulatory Visit (INDEPENDENT_AMBULATORY_CARE_PROVIDER_SITE_OTHER): Payer: Self-pay | Admitting: Obstetrics and Gynecology

## 2016-08-16 VITALS — BP 137/80 | HR 84 | Ht 66.0 in | Wt 235.0 lb

## 2016-08-16 DIAGNOSIS — D259 Leiomyoma of uterus, unspecified: Secondary | ICD-10-CM

## 2016-08-16 NOTE — Progress Notes (Signed)
50 yo G2P3003 presenting today to discuss definitive treatment of her fibroid uterus. Patient was originally seen by Dr. Barkley Boards but due to a loss of insurance was never scheduled for her surgery. She was seen in August by Dr. Roselie Awkward who offered provera and depo-Lupron while awaiting financial assistance. Patient never took either of these medications. She reports monthly heavy cycles of 7 days with passage of clots. She also reports some pelvic/abdominal pain worst with her periods.  Past Medical History:  Diagnosis Date  . Hypertension    Past Surgical History:  Procedure Laterality Date  . TUBAL LIGATION     Family History  Problem Relation Age of Onset  . Cancer Maternal Grandmother     pancreatic   Social History  Substance Use Topics  . Smoking status: Never Smoker  . Smokeless tobacco: Never Used  . Alcohol use No   ROS See pertinent in HPI  Blood pressure 137/80, pulse 84, height 5\' 6"  (1.676 m), weight 235 lb (106.6 kg), last menstrual period 07/13/2016. GENERAL: Well-developed, well-nourished female in no acute distress.  HEENT: Normocephalic, atraumatic. Sclerae anicteric.  NECK: Supple. Normal thyroid.  LUNGS: Clear to auscultation bilaterally.  HEART: Regular rate and rhythm. ABDOMEN: Soft, nontender, nondistended. Palpable fibroid uterus PELVIC: Normal external female genitalia. Vagina is pink and rugated.  Normal discharge. Normal appearing cervix. Uterus is 18-weeks in size. No adnexal mass or tenderness. EXTREMITIES: No cyanosis, clubbing, or edema, 2+ distal pulses.  03/2016 Ultrasound FINDINGS: Uterus  Measurements: 16.4 x 8.2 x 10.1 cm. There are 3 fibroids observed. The largest lies in the mid uterine body to the left of midline and measures 7 x 5.6 x 6.6 cm. Just above this on the left a fibroid measures 5.1 x 2.9 x 3.5 cm. In the uterine fundus a fibroid measures 4.7 x 3.9 x 6.6 cm.  Endometrium  Thickness: The endometrial stripe could not be  adequately measured or visualized. No abnormal endometrial fluid collections were observed.  Right ovary  Measurements: 3.1 x 1.5 x 3.3 cm. There is a paraovarian cyst measuring 3.5 x 2.5 x 3.1 cm.  Left ovary  Measurements: 3.8 x 2.6 x 4.3 cm. There is a 2.7 cm simple appearing cyst with a mixed echogenicity adjacent area measuring 1.8 x 1.7 x 1.9 cm.  Pulsed Doppler evaluation of both ovaries demonstrates normal low-resistance arterial and venous waveforms.  Other findings  No abnormal free fluid.  IMPRESSION: 1. Multiple large uterine fibroids. The endometrial stripe could not be adequately demonstrated. 2. Simple appearing cysts in both ovaries. The right ovarian cystic structure measures 3.5 cm in greatest dimension. In addition to the 2.7 cm simple appearing cyst the left ovary demonstrates a complex appearing structure measuring 1.8 x 1.7 x 1.9 cm. This may reflect an hemorrhagic cyst or endometrioma. Follow-up ultrasound in 8-12 weeks is recommended to reassess the left ovary. 3. No free pelvic fluid.   Electronically Signed   By: David  Martinique M.D.   On: 03/15/2016 09:03  Endometrial biopsy 12/09/15- negative for hyperplasia or malignancy Normal pap smear 09/2014  A/P 50 yo with symptomatic fibroid uterus - Risks, benefits and alternatives were reviewed and explained to the patient including but not limited to risks of bleeding, infection and damage to adjacent organs. Patient verbalized understanding and all questions were answered - Patient desires the surgery to be scheduled after Thanskgiving - patient will be scheduled for TAH with bilateral salpingectomy

## 2016-08-18 ENCOUNTER — Encounter (HOSPITAL_COMMUNITY): Payer: Self-pay | Admitting: *Deleted

## 2016-09-06 NOTE — Patient Instructions (Signed)
Your procedure is scheduled on:  Tuesday, Dec. 5, 2017  Enter through the [employer] [facility] at:  6:30 AM  Pick up the phone at the desk and dial [phone].  Call this number if you have problems the morning of surgery: [phone].  Remember: Do NOT eat food or drink after: Midnight Monday  Take these medicines the morning of surgery with a SIP OF WATER:  Amlodipine, Losartan  Stop ALL herbal medications at this time   Do NOT wear jewelry (body piercing), metal hair clips/bobby pins, make-up, or nail polish. Do NOT wear lotions, powders, or perfumes.  You may wear deodorant. Do NOT shave for 48 hours prior to surgery. Do NOT bring valuables to the hospital. Contacts, dentures, or bridgework may not be worn into surgery.  Leave suitcase in car.  After surgery it may be brought to your room.  For patients admitted to the hospital, checkout time is 11:00 AM the day of discharge.

## 2016-09-08 ENCOUNTER — Encounter (HOSPITAL_COMMUNITY)
Admission: RE | Admit: 2016-09-08 | Discharge: 2016-09-08 | Disposition: A | Discharge status: Home / Self Care | Payer: Self-pay | Source: Ambulatory Visit | Attending: Obstetrics and Gynecology | Admitting: Obstetrics and Gynecology

## 2016-09-08 ENCOUNTER — Other Ambulatory Visit: Payer: Self-pay

## 2016-09-08 ENCOUNTER — Encounter (HOSPITAL_COMMUNITY): Payer: Self-pay

## 2016-09-08 DIAGNOSIS — D259 Leiomyoma of uterus, unspecified: Secondary | ICD-10-CM | POA: Insufficient documentation

## 2016-09-08 DIAGNOSIS — Z01812 Encounter for preprocedural laboratory examination: Secondary | ICD-10-CM | POA: Insufficient documentation

## 2016-09-08 DIAGNOSIS — Z01818 Encounter for other preprocedural examination: Secondary | ICD-10-CM | POA: Insufficient documentation

## 2016-09-08 HISTORY — DX: Anemia, unspecified: D64.9

## 2016-09-08 LAB — CBC
HEMATOCRIT: 36.6 % (ref 36.0–46.0)
HEMOGLOBIN: 12.5 g/dL (ref 12.0–15.0)
MCH: 23.1 pg — AB (ref 26.0–34.0)
MCHC: 34.2 g/dL (ref 30.0–36.0)
MCV: 67.7 fL — ABNORMAL LOW (ref 78.0–100.0)
Platelets: 279 10*3/uL (ref 150–400)
RBC: 5.41 MIL/uL — ABNORMAL HIGH (ref 3.87–5.11)
RDW: 17.5 % — AB (ref 11.5–15.5)
WBC: 8.4 10*3/uL (ref 4.0–10.5)

## 2016-09-08 LAB — BASIC METABOLIC PANEL
Anion gap: 6 (ref 5–15)
BUN: 13 mg/dL (ref 6–20)
CHLORIDE: 106 mmol/L (ref 101–111)
CO2: 26 mmol/L (ref 22–32)
CREATININE: 0.95 mg/dL (ref 0.44–1.00)
Calcium: 9.3 mg/dL (ref 8.9–10.3)
GFR calc Af Amer: >60 mL/min (ref >60)
GFR calc non Af Amer: >60 mL/min (ref >60)
GLUCOSE: 99 mg/dL (ref 65–99)
Potassium: 4.3 mmol/L (ref 3.5–5.1)
Sodium: 138 mmol/L (ref 135–145)

## 2016-09-08 NOTE — Pre-Procedure Instructions (Signed)
EKG reviewed by Dr. Royce Macadamia no new orders received at this time.

## 2016-09-11 MED ORDER — CEFAZOLIN SODIUM-DEXTROSE 2-4 GM/100ML-% IV SOLN
2.0000 g | INTRAVENOUS | Status: AC
  Administered 2016-09-12: 2 g via INTRAVENOUS

## 2016-09-11 NOTE — H&P (Signed)
Mia Davis is an 50 y.o. female G3P3 presenting today for the definitive treatment of symptomatic fibroid uterus. Patient reports monthly periods lasting 7 days associated with dysmenorrhea but more importantly Mia Davis pelvic pressure.   Pertinent Gynecological History: Menses: regular every month without intermenstrual spotting Contraception: tubal ligation DES exposure: denies Blood transfusions: none Sexually transmitted diseases: no past history  Last pap: normal Date: 09/2014 OB History: G3, P3   Menstrual History: Patient's last menstrual period was 07/13/2016.    Past Medical History:  Diagnosis Date  . Anemia   . Hypertension     Past Surgical History:  Procedure Laterality Date  . TUBAL LIGATION      Family History  Problem Relation Age of Onset  . Cancer Maternal Grandmother     pancreatic    Social History:  reports that she has never smoked. She has never used smokeless tobacco. She reports that she does not drink alcohol or use drugs.  Allergies: No Known Allergies  Prescriptions Prior to Admission  Medication Sig Dispense Refill Last Dose  . amLODipine (NORVASC) 10 MG tablet Take 1 tablet (10 mg total) by mouth daily. 90 tablet 1 09/12/2016 at Unknown time  . ibuprofen (ADVIL,MOTRIN) 200 MG tablet Take 200 mg by mouth every 6 (six) hours as needed.   09/11/2016 at Unknown time  . losartan (COZAAR) 100 MG tablet Take 100 mg by mouth daily.   09/12/2016 at Unknown time    ROS See pertinent in HPI Blood pressure (!) 149/98, pulse 76, temperature 98.4 F (36.9 C), temperature source Oral, resp. rate 15, last menstrual period 07/13/2016, SpO2 99 %. Physical Exam GENERAL: Well-developed, well-nourished female in no acute distress.  HEENT: Normocephalic, atraumatic. Sclerae anicteric.  LUNGS: Clear to auscultation bilaterally.  HEART: Regular rate and rhythm. ABDOMEN: Soft, nontender, nondistended. No organomegaly. PELVIC: Normal external female  genitalia. Vagina is pink and rugated.  Normal discharge. Normal appearing cervix. Uterus is 18-weeks in sizee. No adnexal mass or tenderness. EXTREMITIES: No cyanosis, clubbing, or edema, 2+ distal pulses.  Results for orders placed or performed during the hospital encounter of 09/12/16 (from the past 24 hour(s))  Pregnancy, urine     Status: None   Collection Time: 09/12/16  6:40 AM  Result Value Ref Range   Preg Test, Ur NEGATIVE NEGATIVE    No results found. 03/2016 Ultrasound FINDINGS: Uterus  Measurements: 16.4 x 8.2 x 10.1 cm. There are 3 fibroids observed. The largest lies in the mid uterine body to the left of midline and measures 7 x 5.6 x 6.6 cm. Just above this on the left a fibroid measures 5.1 x 2.9 x 3.5 cm. In the uterine fundus a fibroid measures 4.7 x 3.9 x 6.6 cm.  Endometrium  Thickness: The endometrial stripe could not be adequately measured or visualized. No abnormal endometrial fluid collections were observed.  Right ovary  Measurements: 3.1 x 1.5 x 3.3 cm. There is a paraovarian cyst measuring 3.5 x 2.5 x 3.1 cm.  Left ovary  Measurements: 3.8 x 2.6 x 4.3 cm. There is a 2.7 cm simple appearing cyst with a mixed echogenicity adjacent area measuring 1.8 x 1.7 x 1.9 cm.  Pulsed Doppler evaluation of both ovaries demonstrates normal low-resistance arterial and venous waveforms.  Other findings  No abnormal free fluid.  IMPRESSION: 1. Multiple large uterine fibroids. The endometrial stripe could not be adequately demonstrated. 2. Simple appearing cysts in both ovaries. The right ovarian cystic structure measures 3.5 cm in greatest dimension.  In addition to the 2.7 cm simple appearing cyst the left ovary demonstrates a complex appearing structure measuring 1.8 x 1.7 x 1.9 cm. This may reflect an hemorrhagic cyst or endometrioma. Follow-up ultrasound in 8-12 weeks is recommended to reassess the left ovary. 3. No free pelvic  fluid.   Electronically Signed By: David Martinique M.D. On: 03/15/2016 09:03  Endometrial biopsy 12/09/15- negative for hyperplasia or malignancy  Assessment/Plan: 50 yo with symptomatic fibroid uterus here for definitive intervention with TAH/bilateral salpingectomy - Risks, benefits and alternatives were explained including but not limited to risks of bleeding, infection and damage to adjacent organs. Patient verbalized understanding and all questions were answered  Mia Davis 09/12/2016, 8:04 AM

## 2016-09-12 ENCOUNTER — Inpatient Hospital Stay (HOSPITAL_COMMUNITY)
Admission: RE | Admit: 2016-09-12 | Discharge: 2016-09-14 | DRG: 743 | Disposition: A | Discharge status: Home / Self Care | Payer: Self-pay | Source: Ambulatory Visit | Attending: Obstetrics and Gynecology | Admitting: Obstetrics and Gynecology

## 2016-09-12 ENCOUNTER — Inpatient Hospital Stay (HOSPITAL_COMMUNITY): Payer: Self-pay | Admitting: Anesthesiology

## 2016-09-12 ENCOUNTER — Encounter (HOSPITAL_COMMUNITY)
Admission: RE | Disposition: A | Discharge status: Home / Self Care | Payer: Self-pay | Source: Ambulatory Visit | Attending: Obstetrics and Gynecology

## 2016-09-12 ENCOUNTER — Encounter (HOSPITAL_COMMUNITY): Payer: Self-pay | Admitting: Emergency Medicine

## 2016-09-12 DIAGNOSIS — Z90710 Acquired absence of both cervix and uterus: Secondary | ICD-10-CM | POA: Diagnosis present

## 2016-09-12 DIAGNOSIS — N946 Dysmenorrhea, unspecified: Secondary | ICD-10-CM | POA: Diagnosis present

## 2016-09-12 DIAGNOSIS — D259 Leiomyoma of uterus, unspecified: Secondary | ICD-10-CM

## 2016-09-12 DIAGNOSIS — I10 Essential (primary) hypertension: Secondary | ICD-10-CM | POA: Diagnosis present

## 2016-09-12 DIAGNOSIS — N920 Excessive and frequent menstruation with regular cycle: Secondary | ICD-10-CM | POA: Diagnosis present

## 2016-09-12 HISTORY — PX: HYSTERECTOMY ABDOMINAL WITH SALPINGECTOMY: SHX6725

## 2016-09-12 LAB — PREGNANCY, URINE: PREG TEST UR: NEGATIVE

## 2016-09-12 SURGERY — HYSTERECTOMY, TOTAL, ABDOMINAL, WITH SALPINGECTOMY
Anesthesia: General | Site: Abdomen | Laterality: Bilateral

## 2016-09-12 MED ORDER — 0.9 % SODIUM CHLORIDE (POUR BTL) OPTIME
TOPICAL | Status: DC | PRN
  Administered 2016-09-12: 2000 mL

## 2016-09-12 MED ORDER — EPHEDRINE 5 MG/ML INJ
INTRAVENOUS | Status: AC
  Filled 2016-09-12: qty 10

## 2016-09-12 MED ORDER — AMLODIPINE BESYLATE 10 MG PO TABS
10.0000 mg | ORAL_TABLET | Freq: Every day | ORAL | Status: DC
  Administered 2016-09-13 – 2016-09-14 (×2): 10 mg via ORAL
  Filled 2016-09-12 (×2): qty 1

## 2016-09-12 MED ORDER — SCOPOLAMINE 1 MG/3DAYS TD PT72
MEDICATED_PATCH | TRANSDERMAL | Status: DC
Start: 2016-09-12 — End: 2016-09-14
  Administered 2016-09-12: 1.5 mg via TRANSDERMAL
  Filled 2016-09-12: qty 1

## 2016-09-12 MED ORDER — HYDROMORPHONE HCL 1 MG/ML IJ SOLN
INTRAMUSCULAR | Status: AC
  Administered 2016-09-12: 0.5 mg via INTRAVENOUS
  Filled 2016-09-12: qty 1

## 2016-09-12 MED ORDER — BUPIVACAINE HCL (PF) 0.25 % IJ SOLN
INTRAMUSCULAR | Status: DC | PRN
  Administered 2016-09-12: 20 mL

## 2016-09-12 MED ORDER — HYDROMORPHONE 1 MG/ML IV SOLN
INTRAVENOUS | Status: DC
  Administered 2016-09-12: 0.9 mg via INTRAVENOUS
  Administered 2016-09-12: 0.6 mg via INTRAVENOUS
  Administered 2016-09-12: 12:00:00 via INTRAVENOUS
  Administered 2016-09-12: 1.2 mg via INTRAVENOUS
  Administered 2016-09-13: 0.9 mg via INTRAVENOUS
  Administered 2016-09-13: 0.3 mg via INTRAVENOUS
  Administered 2016-09-13: 0.9 mg via INTRAVENOUS
  Filled 2016-09-12: qty 25

## 2016-09-12 MED ORDER — HYDROMORPHONE HCL 1 MG/ML IJ SOLN
INTRAMUSCULAR | Status: AC
  Filled 2016-09-12: qty 1

## 2016-09-12 MED ORDER — BUPIVACAINE HCL (PF) 0.25 % IJ SOLN
INTRAMUSCULAR | Status: AC
  Filled 2016-09-12: qty 30

## 2016-09-12 MED ORDER — ONDANSETRON HCL 4 MG/2ML IJ SOLN: 4.0000 mg | Freq: Four times a day (QID) | INTRAMUSCULAR | Status: DC | PRN

## 2016-09-12 MED ORDER — MIDAZOLAM HCL 2 MG/2ML IJ SOLN
INTRAMUSCULAR | Status: DC | PRN
  Administered 2016-09-12: 2 mg via INTRAVENOUS

## 2016-09-12 MED ORDER — LACTATED RINGERS IV SOLN
INTRAVENOUS | Status: DC
  Administered 2016-09-12 (×3): via INTRAVENOUS

## 2016-09-12 MED ORDER — HYDROMORPHONE HCL 1 MG/ML IJ SOLN
INTRAMUSCULAR | Status: DC | PRN
  Administered 2016-09-12: 1 mg via INTRAVENOUS

## 2016-09-12 MED ORDER — MIDAZOLAM HCL 2 MG/2ML IJ SOLN
INTRAMUSCULAR | Status: AC
  Filled 2016-09-12: qty 2

## 2016-09-12 MED ORDER — FENTANYL CITRATE (PF) 250 MCG/5ML IJ SOLN
INTRAMUSCULAR | Status: AC
  Filled 2016-09-12: qty 5

## 2016-09-12 MED ORDER — DEXAMETHASONE SODIUM PHOSPHATE 10 MG/ML IJ SOLN
INTRAMUSCULAR | Status: DC | PRN
  Administered 2016-09-12: 10 mg via INTRAVENOUS

## 2016-09-12 MED ORDER — DIPHENHYDRAMINE HCL 50 MG/ML IJ SOLN: 12.5000 mg | Freq: Four times a day (QID) | INTRAMUSCULAR | Status: DC | PRN

## 2016-09-12 MED ORDER — LABETALOL HCL 5 MG/ML IV SOLN
INTRAVENOUS | Status: AC
  Filled 2016-09-12: qty 4

## 2016-09-12 MED ORDER — SUGAMMADEX SODIUM 200 MG/2ML IV SOLN
INTRAVENOUS | Status: AC
  Filled 2016-09-12: qty 2

## 2016-09-12 MED ORDER — LOSARTAN POTASSIUM 50 MG PO TABS
100.0000 mg | ORAL_TABLET | Freq: Every day | ORAL | Status: DC
  Administered 2016-09-13 – 2016-09-14 (×2): 100 mg via ORAL
  Filled 2016-09-12 (×2): qty 2

## 2016-09-12 MED ORDER — FENTANYL CITRATE (PF) 100 MCG/2ML IJ SOLN
INTRAMUSCULAR | Status: AC
  Filled 2016-09-12: qty 2

## 2016-09-12 MED ORDER — NALOXONE HCL 0.4 MG/ML IJ SOLN: 0.4000 mg | INTRAMUSCULAR | Status: DC | PRN

## 2016-09-12 MED ORDER — FENTANYL CITRATE (PF) 100 MCG/2ML IJ SOLN
INTRAMUSCULAR | Status: DC | PRN
  Administered 2016-09-12 (×7): 50 ug via INTRAVENOUS

## 2016-09-12 MED ORDER — GLYCOPYRROLATE 0.2 MG/ML IJ SOLN
INTRAMUSCULAR | Status: DC | PRN
  Administered 2016-09-12: 0.1 mg via INTRAVENOUS

## 2016-09-12 MED ORDER — PROPOFOL 10 MG/ML IV BOLUS
INTRAVENOUS | Status: DC | PRN
  Administered 2016-09-12: 20 mg via INTRAVENOUS
  Administered 2016-09-12: 180 mg via INTRAVENOUS

## 2016-09-12 MED ORDER — DIPHENHYDRAMINE HCL 12.5 MG/5ML PO ELIX: 12.5000 mg | ORAL_SOLUTION | Freq: Four times a day (QID) | ORAL | Status: DC | PRN

## 2016-09-12 MED ORDER — METOCLOPRAMIDE HCL 5 MG/ML IJ SOLN
10.0000 mg | Freq: Once | INTRAMUSCULAR | Status: DC | PRN
Start: 2016-09-12 — End: 2016-09-12

## 2016-09-12 MED ORDER — ONDANSETRON HCL 4 MG/2ML IJ SOLN
INTRAMUSCULAR | Status: AC
  Filled 2016-09-12: qty 2

## 2016-09-12 MED ORDER — DEXAMETHASONE SODIUM PHOSPHATE 10 MG/ML IJ SOLN
INTRAMUSCULAR | Status: AC
  Filled 2016-09-12: qty 1

## 2016-09-12 MED ORDER — GLYCOPYRROLATE 0.2 MG/ML IJ SOLN
INTRAMUSCULAR | Status: AC
  Filled 2016-09-12: qty 1

## 2016-09-12 MED ORDER — ONDANSETRON HCL 4 MG/2ML IJ SOLN
INTRAMUSCULAR | Status: DC | PRN
  Administered 2016-09-12: 4 mg via INTRAVENOUS

## 2016-09-12 MED ORDER — HYDROMORPHONE HCL 1 MG/ML IJ SOLN
0.2500 mg | INTRAMUSCULAR | Status: DC | PRN
  Administered 2016-09-12 (×2): 0.5 mg via INTRAVENOUS

## 2016-09-12 MED ORDER — MEPERIDINE HCL 25 MG/ML IJ SOLN: 6.2500 mg | INTRAMUSCULAR | Status: DC | PRN

## 2016-09-12 MED ORDER — LABETALOL HCL 5 MG/ML IV SOLN
INTRAVENOUS | Status: DC | PRN
  Administered 2016-09-12: 2.5 mg via INTRAVENOUS
  Administered 2016-09-12 (×2): 5 mg via INTRAVENOUS

## 2016-09-12 MED ORDER — ROCURONIUM BROMIDE 100 MG/10ML IV SOLN
INTRAVENOUS | Status: DC | PRN
  Administered 2016-09-12: 5 mg via INTRAVENOUS
  Administered 2016-09-12: 40 mg via INTRAVENOUS
  Administered 2016-09-12 (×2): 10 mg via INTRAVENOUS

## 2016-09-12 MED ORDER — LIDOCAINE HCL (CARDIAC) 20 MG/ML IV SOLN
INTRAVENOUS | Status: AC
Start: 2016-09-12 — End: 2016-09-12
  Filled 2016-09-12: qty 5

## 2016-09-12 MED ORDER — PROPOFOL 10 MG/ML IV BOLUS
INTRAVENOUS | Status: AC
  Filled 2016-09-12: qty 40

## 2016-09-12 MED ORDER — SCOPOLAMINE 1 MG/3DAYS TD PT72
1.0000 | MEDICATED_PATCH | Freq: Once | TRANSDERMAL | Status: DC
  Administered 2016-09-12: 1.5 mg via TRANSDERMAL

## 2016-09-12 MED ORDER — SODIUM CHLORIDE 0.9% FLUSH: 9.0000 mL | INTRAVENOUS | Status: DC | PRN

## 2016-09-12 MED ORDER — EPHEDRINE SULFATE 50 MG/ML IJ SOLN
INTRAMUSCULAR | Status: DC | PRN
  Administered 2016-09-12: 5 mg via INTRAVENOUS

## 2016-09-12 MED ORDER — LIDOCAINE HCL (CARDIAC) 20 MG/ML IV SOLN
INTRAVENOUS | Status: DC | PRN
  Administered 2016-09-12: 80 mg via INTRAVENOUS

## 2016-09-12 MED ORDER — LACTATED RINGERS IV SOLN
INTRAVENOUS | Status: DC
  Administered 2016-09-12: 17:00:00 via INTRAVENOUS

## 2016-09-12 MED ORDER — ROCURONIUM BROMIDE 100 MG/10ML IV SOLN
INTRAVENOUS | Status: AC
  Filled 2016-09-12: qty 1

## 2016-09-12 MED ORDER — SUGAMMADEX SODIUM 200 MG/2ML IV SOLN
INTRAVENOUS | Status: DC | PRN
  Administered 2016-09-12: 200 mg via INTRAVENOUS

## 2016-09-12 SURGICAL SUPPLY — 35 items
CANISTER SUCT 3000ML (MISCELLANEOUS) ×2 IMPLANT
CLOTH BEACON ORANGE TIMEOUT ST (SAFETY) ×2 IMPLANT
CONT PATH 16OZ SNAP LID 3702 (MISCELLANEOUS) ×2 IMPLANT
DECANTER SPIKE VIAL GLASS SM (MISCELLANEOUS) ×2 IMPLANT
DRAPE CESAREAN BIRTH W POUCH (DRAPES) ×2 IMPLANT
DRAPE WARM FLUID 44X44 (DRAPE) ×2 IMPLANT
DRSG OPSITE POSTOP 4X10 (GAUZE/BANDAGES/DRESSINGS) ×2 IMPLANT
DURAPREP 26ML APPLICATOR (WOUND CARE) ×2 IMPLANT
GAUZE SPONGE 4X4 16PLY XRAY LF (GAUZE/BANDAGES/DRESSINGS) ×2 IMPLANT
GLOVE BIOGEL PI IND STRL 6.5 (GLOVE) ×1 IMPLANT
GLOVE BIOGEL PI IND STRL 7.0 (GLOVE) ×3 IMPLANT
GLOVE BIOGEL PI INDICATOR 6.5 (GLOVE) ×1
GLOVE BIOGEL PI INDICATOR 7.0 (GLOVE) ×3
GLOVE SURG SS PI 6.0 STRL IVOR (GLOVE) ×2 IMPLANT
GOWN STRL REUS W/TWL LRG LVL3 (GOWN DISPOSABLE) ×6 IMPLANT
NEEDLE HYPO 22GX1.5 SAFETY (NEEDLE) ×2 IMPLANT
NS IRRIG 1000ML POUR BTL (IV SOLUTION) ×2 IMPLANT
PACK ABDOMINAL GYN (CUSTOM PROCEDURE TRAY) ×2 IMPLANT
PAD ABD 8X7 1/2 STERILE (GAUZE/BANDAGES/DRESSINGS) ×2 IMPLANT
PAD OB MATERNITY 4.3X12.25 (PERSONAL CARE ITEMS) ×2 IMPLANT
PENCIL SMOKE EVAC W/HOLSTER (ELECTROSURGICAL) ×2 IMPLANT
PROTECTOR NERVE ULNAR (MISCELLANEOUS) ×2 IMPLANT
SEPRAFILM MEMBRANE 5X6 (MISCELLANEOUS) IMPLANT
SPONGE GAUZE 4X4 12PLY STER LF (GAUZE/BANDAGES/DRESSINGS) ×2 IMPLANT
SPONGE LAP 18X18 X RAY DECT (DISPOSABLE) ×2 IMPLANT
SUT PLAIN 2 0 (SUTURE) ×1
SUT PLAIN ABS 2-0 CT1 27XMFL (SUTURE) ×1 IMPLANT
SUT VIC AB 0 CT1 18XCR BRD8 (SUTURE) ×2 IMPLANT
SUT VIC AB 0 CT1 36 (SUTURE) ×4 IMPLANT
SUT VIC AB 0 CT1 8-18 (SUTURE) ×2
SUT VIC AB 4-0 KS 27 (SUTURE) ×2 IMPLANT
SUT VICRYL 0 TIES 12 18 (SUTURE) ×2 IMPLANT
SYR CONTROL 10ML LL (SYRINGE) ×2 IMPLANT
TOWEL OR 17X24 6PK STRL BLUE (TOWEL DISPOSABLE) ×4 IMPLANT
TRAY FOLEY CATH SILVER 14FR (SET/KITS/TRAYS/PACK) ×2 IMPLANT

## 2016-09-12 NOTE — Transfer of Care (Signed)
Immediate Anesthesia Transfer of Care Note  Patient: Emmersen A Wohler  Procedure(s) Performed: Procedure(s): HYSTERECTOMY ABDOMINAL WITH SALPINGECTOMY (Bilateral)  Patient Location: PACU  Anesthesia Type:General  Level of Consciousness: awake and patient cooperative  Airway & Oxygen Therapy: Patient Spontanous Breathing and Patient connected to nasal cannula oxygen  Post-op Assessment: Report given to RN and Post -op Vital signs reviewed and stable  Post vital signs: Reviewed and stable  Last Vitals:  Vitals:   09/12/16 0652  BP: (!) 149/98  Pulse: 76  Resp: 15  Temp: 36.9 C    Last Pain:  Vitals:   09/12/16 0652  TempSrc: Oral      Patients Stated Pain Goal: 6 ([id number])  Complications: No apparent anesthesia complications

## 2016-09-12 NOTE — Anesthesia Postprocedure Evaluation (Signed)
Anesthesia Post Note  Patient: Mia Davis  Procedure(s) Performed: Procedure(s) (LRB): HYSTERECTOMY ABDOMINAL WITH SALPINGECTOMY (Bilateral)  Patient location during evaluation: PACU Anesthesia Type: General Level of consciousness: awake and alert and oriented Pain management: pain level controlled Vital Signs Assessment: post-procedure vital signs reviewed and stable Respiratory status: spontaneous breathing, nonlabored ventilation, respiratory function stable and patient connected to nasal cannula oxygen Cardiovascular status: blood pressure returned to baseline and stable Postop Assessment: no signs of nausea or vomiting Anesthetic complications: no     Last Vitals:  Vitals:   09/12/16 1030 09/12/16 1045  BP: (!) 153/92 (!) 169/86  Pulse: 64 75  Resp: 18 (!) 21  Temp:      Last Pain:  Vitals:   09/12/16 0652  TempSrc: Oral   Pain Goal: Patients Stated Pain Goal: 6 (09/12/16 [id number])               Tajia Szeliga A.

## 2016-09-12 NOTE — OR Nursing (Signed)
WEIGHT OF UTERUS,CERVIX = 621.6 GRAMS

## 2016-09-12 NOTE — Anesthesia Procedure Notes (Signed)
Procedure Name: Intubation Date/Time: 09/12/2016 8:22 AM Performed by: Georgeanne Nim Pre-anesthesia Checklist: Patient identified, Emergency Drugs available, Suction available, Patient being monitored and Timeout performed Patient Re-evaluated:Patient Re-evaluated prior to inductionOxygen Delivery Method: Circle system utilized Preoxygenation: Pre-oxygenation with 100% oxygen Intubation Type: IV induction Ventilation: Mask ventilation without difficulty Laryngoscope Size: Mac and 3 Grade View: Grade I Tube type: Oral Tube size: 7.0 mm Number of attempts: 1 Airway Equipment and Method: Stylet Placement Confirmation: ETT inserted through vocal cords under direct vision,  positive ETCO2,  CO2 detector and breath sounds checked- equal and bilateral Secured at: 21 cm Tube secured with: Tape

## 2016-09-12 NOTE — Anesthesia Postprocedure Evaluation (Signed)
Anesthesia Post Note  Patient: Mia Davis  Procedure(s) Performed: Procedure(s) (LRB): HYSTERECTOMY ABDOMINAL WITH SALPINGECTOMY (Bilateral)  Patient location during evaluation: [facility] Anesthesia Type: General Level of consciousness: awake and alert Pain management: satisfactory to patient Vital Signs Assessment: post-procedure vital signs reviewed and stable Respiratory status: spontaneous breathing and respiratory function stable Cardiovascular status: stable Postop Assessment: adequate PO intake Anesthetic complications: no     Last Vitals:  Vitals:   09/12/16 1355 09/12/16 1615  BP:  (!) 144/82  Pulse:  72  Resp: 16 16  Temp:  36.4 C    Last Pain:  Vitals:   09/12/16 1615  TempSrc: Oral  PainSc:    Pain Goal: Patients Stated Pain Goal: 6 (09/12/16 [id number])               Katherina Mires

## 2016-09-12 NOTE — Addendum Note (Signed)
Addendum  created 09/12/16 1752 by Flossie Dibble, CRNA   Sign clinical note

## 2016-09-12 NOTE — Anesthesia Preprocedure Evaluation (Signed)
Anesthesia Evaluation  Patient identified by MRN, date of birth, ID band Patient awake    Reviewed: Allergy & Precautions, NPO status , Patient's Chart, lab work & pertinent test results  Airway Mallampati: I  TM Distance: >3 FB Neck ROM: Full    Dental no notable dental hx. (+) Caps   Pulmonary neg pulmonary ROS,    Pulmonary exam normal breath sounds clear to auscultation       Cardiovascular hypertension, Pt. on medications Normal cardiovascular exam Rhythm:Regular     Neuro/Psych negative neurological ROS  negative psych ROS   GI/Hepatic negative GI ROS, Neg liver ROS,   Endo/Other  negative endocrine ROS  Renal/GU negative Renal ROS  negative genitourinary   Musculoskeletal negative musculoskeletal ROS (+)   Abdominal (+) + obese,   Peds  Hematology  (+) anemia ,   Anesthesia Other Findings   Reproductive/Obstetrics Menorrhagia Fibroid uterus                             Lab Results  Component Value Date   WBC 8.4 09/08/2016   HGB 12.5 09/08/2016   HCT 36.6 09/08/2016   MCV 67.7 (L) 09/08/2016   PLT 279 09/08/2016     Chemistry      Component Value Date/Time   NA 138 09/08/2016 1145   K 4.3 09/08/2016 1145   CL 106 09/08/2016 1145   CO2 26 09/08/2016 1145   BUN 13 09/08/2016 1145   CREATININE 0.95 09/08/2016 1145   CREATININE 1.10 (H) 08/11/2016 1606      Component Value Date/Time   CALCIUM 9.3 09/08/2016 1145   ALKPHOS 68 08/11/2016 1606   AST 15 08/11/2016 1606   ALT 13 08/11/2016 1606   BILITOT 0.7 08/11/2016 1606     EKG: normal sinus rhythm, poor r wave progression.  Anesthesia Physical Anesthesia Plan  ASA: II  Anesthesia Plan: General   Post-op Pain Management:    Induction: Intravenous  Airway Management Planned: Oral ETT  Additional Equipment:   Intra-op Plan:   Post-operative Plan: Extubation in OR  Informed Consent: I have reviewed the  patients History and Physical, chart, labs and discussed the procedure including the risks, benefits and alternatives for the proposed anesthesia with the patient or authorized representative who has indicated his/her understanding and acceptance.   Dental advisory given  Plan Discussed with: CRNA, Anesthesiologist and Surgeon  Anesthesia Plan Comments:         Anesthesia Quick Evaluation

## 2016-09-12 NOTE — Op Note (Signed)
Mia Davis PROCEDURE DATE: 09/12/2016  PREOPERATIVE DIAGNOSIS:  50 y.o. G3P3 with symptomatic fibroid uterus POSTOPERATIVE DIAGNOSIS:  Same SURGEON:   Mora Bellman, M.D. ASSISTANT:   Arlina Robes  OPERATION:  Total abdominal hysterectomy, Bilateral Salpingectomy ANESTHESIA:  General endotracheal.  INDICATIONS: The patient is a 50 y.o. G3P3 with history of symptomatic uterine fibroids/menorrhagia. The patient made a decision to undergo definite surgical treatment. On the preoperative visit, the risks, benefits, indications, and alternatives of the procedure were reviewed with the patient.  On the day of surgery, the risks of surgery were again discussed with the patient including but not limited to: bleeding which may require transfusion or reoperation; infection which may require antibiotics; injury to bowel, bladder, ureters or other surrounding organs; need for additional procedures; thromboembolic phenomenon, incisional problems and other postoperative/anesthesia complications. Written informed consent was obtained.    OPERATIVE FINDINGS: An 14- week size uterus with normal tubes and ovaries bilaterally.  ESTIMATED BLOOD LOSS: 200 ml FLUIDS:  2000 ml of Lactated Ringers URINE OUTPUT:  300 ml of clear yellow urine. SPECIMENS:  Uterus and bilateral tubes sent to pathology COMPLICATIONS:  None immediate.   DESCRIPTION OF PROCEDURE: The patient received intravenous antibiotics and had sequential compression devices applied to her lower extremities while in the preoperative area.  She was taken to the operating room where anesthesia was induced and found to be adequate. The patient was placed in supine position. The abdomen and perineum were prepped and draped in a sterile manner, and a Foley catheter was inserted into the bladder and attached to Chanette Demo drainage. After an adequate timeout was performed, a Pfannensteil skin incision was made. This incision was taken down to the fascia using  electrocautery with care given to maintain good hemostasis. The fascia was incised in the midline and the fascial incision was then extended bilaterally using mayo scissors. The fascia was then dissected off the underlying rectus muscles using blunt and sharp dissection. The rectus muscles were split bluntly in the midline and the peritoneum entered sharply without complication. This peritoneal incision was then extended superiorly and inferiorly with care given to prevent bowel or bladder injury. Upon entry into the abdominal cavity, the upper abdomen was inspected and found to be normal. Attention was then turned to the pelvis. A retractor was placed into the incision, and the bowel was packed away with moist laparotomy sponges. The uterus at this point was noted to be mobilized. The round ligaments on each side were clamped, suture ligated, and transected with electrocautery allowing entry into the broad ligament. The anterior and posterior leaves of the broad ligament were separated and the ureters were inspected to be safely away from the area of dissection bilaterally. A hole was created in the clear portion of the posterior broad ligament. Adnexae were clamped on the patient's right side. This pedicle was then clamped, cut, and doubly suture ligated with good hemostasis. This procedure was repeated in an identical fashion on the left site allowing for both adnexa to remain in place. A bladder flap was then created across the anterior leaf of the broad ligament and the bladder was then bluntly dissected off the lower uterine segment and cervix with good hemostasis. The uterine arteries were then skeletonized bilaterally and then clamped, cut, and ligated with care given to prevent ureteral injury. The uterosacral ligaments were then clamped, cut, and ligated bilaterally. Finally, the cardinal ligaments were clamped, cut, and ligated bilaterally.  Acutely curved clamps were placed across the vagina just  under  the cervix, and the specimen was amputated and sent to pathology. The vaginal cuff was closed with a series of interrupted 0 Vicryl figure-of-eight sutures with care given to incorporate the anterior pubocervical fascia and the posterior rectovaginal fascia.  The vaginal cuff angles were noted to have good hemostasis.  The pelvis was irrigated and hemostasis was reconfirmed at all pedicles and along the pelvic sidewall.  Kary Kos were used to grasp bilateral fallopian tubes. They were transected and suture ligated, allowing for bilateral salpingectomy. All laparotomy sponges and instruments were removed from the abdomen. The peritoneum was closed with 2-0 Vicryl, and the fascia was closed with 0 Vicryl in a running fashion. The subcutaneous layer was reapproximated with 2-0 plain gut. The skin was closed with a 4-0 Monocryl subcuticular stitch. Sponge, lap, needle, and instrument counts were correct times two. The patient was taken to the recovery area awake, extubated and in stable condition.

## 2016-09-13 ENCOUNTER — Encounter (HOSPITAL_COMMUNITY): Payer: Self-pay | Admitting: Obstetrics and Gynecology

## 2016-09-13 LAB — CBC
HCT: 30.8 % — ABNORMAL LOW (ref 36.0–46.0)
Hemoglobin: 10.6 g/dL — ABNORMAL LOW (ref 12.0–15.0)
MCH: 22.9 pg — AB (ref 26.0–34.0)
MCHC: 34.4 g/dL (ref 30.0–36.0)
MCV: 66.7 fL — AB (ref 78.0–100.0)
PLATELETS: 229 10*3/uL (ref 150–400)
RBC: 4.62 MIL/uL (ref 3.87–5.11)
RDW: 17.6 % — AB (ref 11.5–15.5)
WBC: 10.6 10*3/uL — AB (ref 4.0–10.5)

## 2016-09-13 MED ORDER — IBUPROFEN 600 MG PO TABS
600.0000 mg | ORAL_TABLET | Freq: Four times a day (QID) | ORAL | Status: DC | PRN
  Filled 2016-09-13: qty 1

## 2016-09-13 MED ORDER — OXYCODONE-ACETAMINOPHEN 5-325 MG PO TABS
1.0000 | ORAL_TABLET | Freq: Four times a day (QID) | ORAL | Status: DC | PRN
  Administered 2016-09-13: 2 via ORAL
  Administered 2016-09-14: 1 via ORAL
  Filled 2016-09-13 (×2): qty 2
  Filled 2016-09-13: qty 1

## 2016-09-13 NOTE — Progress Notes (Signed)
1 Day Post-Op Procedure(s) (LRB): HYSTERECTOMY ABDOMINAL WITH SALPINGECTOMY (Bilateral)  Subjective: Patient reports doing well overnight. She denies chest pain, SOB, lightheadedness/dizziness. She tolerated clear liquid diet. She ambulated    Objective: I have reviewed patient's vital signs, intake and output and labs.  General: alert, cooperative and no distress Resp: clear to auscultation bilaterally Cardio: regular rate and rhythm GI: soft, non-tender; bowel sounds normal; no masses,  no organomegaly and incision: clean, dry and intact Extremities: extremities normal, atraumatic, no cyanosis or edema and no edema, redness or tenderness in the calves or thighs  Assessment: s/p Procedure(s): HYSTERECTOMY ABDOMINAL WITH SALPINGECTOMY (Bilateral): stable and progressing well  Plan: Advance diet Encourage ambulation Advance to PO medication Discontinue IV fluids  LOS: 1 day    Mia Davis 09/13/2016, 8:42 AM

## 2016-09-14 MED ORDER — OXYCODONE-ACETAMINOPHEN 5-325 MG PO TABS: 1.0000 | ORAL_TABLET | Freq: Four times a day (QID) | ORAL | 0 refills | Status: DC | PRN

## 2016-09-14 MED ORDER — IBUPROFEN 600 MG PO TABS: 600.0000 mg | ORAL_TABLET | Freq: Four times a day (QID) | ORAL | 3 refills | Status: DC | PRN

## 2016-09-14 MED ORDER — DOCUSATE SODIUM 100 MG PO CAPS: 100.0000 mg | ORAL_CAPSULE | Freq: Two times a day (BID) | ORAL | 2 refills | Status: DC | PRN

## 2016-09-14 NOTE — Discharge Summary (Signed)
Physician Discharge Summary  Patient ID: Mia Davis MRN: [id number] DOB/AGE: 1966/06/26 50 y.o.  Admit date: 09/12/2016 Discharge date: 09/14/2016  Admission Diagnoses: symptomatic fibroid uterus  Discharge Diagnoses:  Active Problems:   S/P TAH (total abdominal hysterectomy)   Discharged Condition: good  Hospital Course: patient admitted for scheduled TAH with bilateral salpingectomy for the definitve treatment of symptomatic fibroid uterus. Patient underwent an uncomplicated procedure where a 600 gm uterus was removed. She did well postoperatively. She tolerated po intake, ambulated, voided and passed flatus without issues. She was found stable for discharge on POD#2. Discharge instructions were reviewed with the patient.   Consults: None  Treatments: surgery: TAH/bilateral salpingectomy  Discharge Exam: Blood pressure (!) 127/59, pulse 74, temperature 99.4 F (37.4 C), temperature source Oral, resp. rate 18, height 5' 5.98" (1.676 m), weight 232 lb (105.2 kg), last menstrual period 07/13/2016, SpO2 100 %. General appearance: alert, cooperative and no distress Resp: clear to auscultation bilaterally Cardio: regular rate and rhythm GI: soft, appropriately tender, non distended Extremities: extremities normal, atraumatic, no cyanosis or edema Incision/Wound:honey comb dressing intact, minimally stained with old blood in right inferior aspect  Disposition: 01-Home or Self Care     Medication List    TAKE these medications   amLODipine 10 MG tablet Commonly known as:  NORVASC Take 1 tablet (10 mg total) by mouth daily.   docusate sodium 100 MG capsule Commonly known as:  COLACE Take 1 capsule (100 mg total) by mouth 2 (two) times daily as needed.   ibuprofen 600 MG tablet Commonly known as:  ADVIL,MOTRIN Take 1 tablet (600 mg total) by mouth every 6 (six) hours as needed for fever or headache. What changed:  medication strength  how much to take  reasons to  take this   losartan 100 MG tablet Commonly known as:  COZAAR Take 100 mg by mouth daily.   oxyCODONE-acetaminophen 5-325 MG tablet Commonly known as:  PERCOCET/ROXICET Take 1-2 tablets by mouth every 6 (six) hours as needed for moderate pain or severe pain.      Follow-up [facility] for [place] Follow up.   Specialty:  Obstetrics and Gynecology Why:  An appointment will be made for you for post op check in 4-6 weeks Contact information: [place] [phone]          Signed: [place] 09/14/2016, 8:09 AM

## 2016-09-14 NOTE — Discharge Instructions (Signed)
Abdominal Hysterectomy, Care After Refer to this sheet in the next few weeks. These instructions provide you with information on caring for yourself after your procedure. Your health care provider may also give you more specific instructions. Your treatment has been planned according to current medical practices, but problems sometimes occur. Call your health care provider if you have any problems or questions after your procedure. What can I expect after the procedure? After your procedure, it is typical to have the following:  Pain.  Feeling tired.  Poor appetite.  Less interest in sex. It takes 4-6 weeks to recover from this surgery. Follow these instructions at home:  Take pain medicines only as directed by your health care provider. Do not take over-the-counter pain medicines without checking with your health care provider first.  Change your bandage as directed by your health care provider.  Return to your health care provider to have your sutures taken out.  Take showers instead of baths for 2-3 weeks. Ask your health care provider when it is safe to start showering.  Do not douche, use tampons, or have sexual intercourse for at least 6 weeks or until your health care provider says you can.  Follow your health care provider's advice about exercise, lifting, driving, and general activities.  Get plenty of rest and sleep.  Do not lift anything heavier than a gallon of milk (about 10 lb [4.5 kg]) for the first month after surgery.  You can resume your normal diet if your health care provider says it is okay.  Do not drink alcohol until your health care provider says you can.  If you are constipated, ask your health care provider if you can take a mild laxative.  Eating foods high in fiber may also help with constipation. Eat plenty of raw fruits and vegetables, whole grains, and beans.  Drink enough fluids to keep your urine clear or pale yellow.  Try to have someone at  home with you for the first 1-2 weeks to help around the house.  Keep all follow-up appointments. Contact a health care provider if:  You have chills or fever.  You have swelling, redness, or pain in the area of your incision that is getting worse.  You have pus coming from the incision.  You notice a bad smell coming from the incision or bandage.  Your incision breaks open.  You feel dizzy or light-headed.  You have pain or bleeding when you urinate.  You have persistent diarrhea.  You have persistent nausea and vomiting.  You have abnormal vaginal discharge.  You have a rash.  You have any type of abnormal reaction or develop an allergy to your medicine.  Your pain medicine is not helping. Get help right away if:  You have a fever and your symptoms suddenly get worse.  You have severe abdominal pain.  You have chest pain.  You have shortness of breath.  You faint.  You have pain, swelling, or redness of your leg.  You have heavy vaginal bleeding with blood clots. This information is not intended to replace advice given to you by your health care provider. Make sure you discuss any questions you have with your health care provider. Document Released: 04/14/2005 Document Revised: 03/08/2016 Document Reviewed: 07/18/2013 Elsevier Interactive Patient Education  2017 [employer].

## 2016-09-14 NOTE — Progress Notes (Signed)
Discharge instructions, prescriptions, and follow-up visit reviewed with teach back.

## 2016-09-19 MED FILL — LOSARTAN POTASSIUM 100 MG T: 100 | 30 days supply | Qty: 30 | Fill #2

## 2016-09-19 MED FILL — AMLODIPINE BESYLATE 10 MG T: 10 | 30 days supply | Qty: 30 | Fill #2

## 2016-09-22 ENCOUNTER — Encounter: Payer: Self-pay | Admitting: Obstetrics and Gynecology

## 2016-09-25 NOTE — Progress Notes (Signed)
Mia Davis, is a 50 y.o. female  [id number]  [id number]  DOB - [birth year]  CC:  Chief Complaint  Patient presents with  . Hypertension       HPI: Mia Davis is a 50 y.o. female here  for follow-up hypertension.. She currently says she is doing well She is on amlodipine 10 mg and losartan 100 mg. Her BP today is 138/72. She reports being careful with salt.  Health Maintenance: She declines flu shot. Reports mammogram, PAP smear and colonoscopy are current.  No Known Allergies Past Medical History:  Diagnosis Date  . Anemia   . Hypertension    Current Outpatient Prescriptions on File Prior to Visit  Medication Sig Dispense Refill  . amLODipine (NORVASC) 10 MG tablet Take 1 tablet (10 mg total) by mouth daily. 90 tablet 1  . [DISCONTINUED] lisinopril (PRINIVIL,ZESTRIL) 20 MG tablet Take 0.5 tablets (10 mg total) by mouth daily. 30 tablet 0   No current facility-administered medications on file prior to visit.    Family History  Problem Relation Age of Onset  . Cancer Maternal Grandmother     pancreatic   Social History   Social History  . Marital status: Single    Spouse name: N/A  . Number of children: N/A  . Years of education: N/A   Occupational History  . Not on file.   Social History Main Topics  . Smoking status: Never Smoker  . Smokeless tobacco: Never Used  . Alcohol use No  . Drug use: No  . Sexual activity: Not on file   Other Topics Concern  . Not on file   Social History Narrative  . No narrative on file    Review of Systems: Constitutional: Negative Skin: Negative HENT: Negative  Eyes: Negative  Neck: Negative Respiratory: Negative Cardiovascular: Negative Gastrointestinal: Negative Genitourinary: Negative  Musculoskeletal: Negative   Neurological: Negative  Hematological: Negative  Psychiatric/Behavioral: Negative    Objective:   Vitals:   08/11/16 1540  BP: 138/72  Pulse: 69  Resp: 18  Temp: 98.6 F (37 C)     Physical Exam: Constitutional: Patient appears well-developed and well-nourished. No distress. HENT: Normocephalic, atraumatic, External right and left ear normal. Oropharynx is clear and moist.  Eyes: Conjunctivae and EOM are normal. PERRLA, no scleral icterus. Neck: Normal ROM. Neck supple. No lymphadenopathy, No thyromegaly. CVS: RRR, S1/S2 +, no murmurs, no gallops, no rubs Pulmonary: Effort and breath sounds normal, no stridor, rhonchi, wheezes, rales.  Abdominal: Soft. Normoactive BS,, no distension, tenderness, rebound or guarding.  Musculoskeletal: Normal range of motion. No edema and no tenderness.  Neuro: Alert.Normal muscle tone coordination. Non-focal Skin: Skin is warm and dry. No rash noted. Not diaphoretic. No erythema. No pallor. Psychiatric: Normal mood and affect. Behavior, judgment, thought content normal.  Lab Results  Component Value Date   WBC 10.6 (H) 09/13/2016   HGB 10.6 (L) 09/13/2016   HCT 30.8 (L) 09/13/2016   MCV 66.7 (L) 09/13/2016   PLT 229 09/13/2016   Lab Results  Component Value Date   CREATININE 0.95 09/08/2016   BUN 13 09/08/2016   NA 138 09/08/2016   K 4.3 09/08/2016   CL 106 09/08/2016   CO2 26 09/08/2016    Lab Results  Component Value Date   HGBA1C 5.4 08/11/2016   Lipid Panel     Component Value Date/Time   CHOL 139 08/11/2016 1606   TRIG 76 08/11/2016 1606   HDL 49 08/11/2016 1606   CHOLHDL 2.8 08/11/2016  1606   VLDL 15 08/11/2016 1606   LDLCALC 75 08/11/2016 1606        Assessment and plan:   1. Essential hypertension  - Hemoglobin A1c - CBC with Differential - COMPLETE METABOLIC PANEL WITH GFR - Lipid panel - TSH   Return in about 6 months (around 02/08/2017).  The patient was given clear instructions to go to ER or return to medical center if symptoms don't improve, worsen or new problems develop. The patient verbalized understanding.    Micheline Chapman FNP  09/25/2016, 11:40 AM

## 2016-09-26 ENCOUNTER — Encounter: Payer: Self-pay | Admitting: Obstetrics and Gynecology

## 2016-10-18 ENCOUNTER — Other Ambulatory Visit: Payer: Self-pay | Admitting: Family Medicine

## 2016-10-23 NOTE — Progress Notes (Signed)
This was a nurse visit only for BP check.

## 2016-10-26 ENCOUNTER — Encounter: Payer: Self-pay | Admitting: Obstetrics and Gynecology

## 2016-10-26 ENCOUNTER — Ambulatory Visit (INDEPENDENT_AMBULATORY_CARE_PROVIDER_SITE_OTHER): Payer: Self-pay | Admitting: Obstetrics and Gynecology

## 2016-10-26 VITALS — BP 134/81 | HR 72 | Wt 233.0 lb

## 2016-10-26 DIAGNOSIS — Z9889 Other specified postprocedural states: Secondary | ICD-10-CM

## 2016-10-26 NOTE — Progress Notes (Signed)
51 yo presenting today for post op check. Patient underwent a TAH with bilateral salpingectomy on 09/12/2016 for symptomatic fibroid uterus. Patient reports doing well since the surgery. She denies any fevers or abnormal drainage form the incision. Her pain is minimal at this time  Past Medical History:  Diagnosis Date  . Anemia   . Hypertension    Past Surgical History:  Procedure Laterality Date  . HYSTERECTOMY ABDOMINAL WITH SALPINGECTOMY Bilateral 09/12/2016   Procedure: HYSTERECTOMY ABDOMINAL WITH SALPINGECTOMY;  Surgeon: Mora Bellman, MD;  Location: [facility];  Service: Gynecology;  Laterality: Bilateral;  . TUBAL LIGATION     Family History  Problem Relation Age of Onset  . Cancer Maternal Grandmother     pancreatic   Social History  Substance Use Topics  . Smoking status: Never Smoker  . Smokeless tobacco: Never Used  . Alcohol use No   ROS See pertinent in HPI  Blood pressure 134/81, pulse 72, weight 233 lb (105.7 kg). GENERAL: Well-developed, well-nourished female in no acute distress.  ABDOMEN: Soft, nontender, nondistended. No organomegaly. Incision: no erythema, induration or drainage. Healing well PELVIC: Normal external female genitalia. Vagina is pink and rugated.  Normal discharge. No adnexal mass or tenderness. EXTREMITIES: No cyanosis, clubbing, or edema, 2+ distal pulses.  A/P 51 yo here for post op check - Patient is doing well - pathology results reviewed - Patient is medically cleared to resume all activities of daily living - RTC prn

## 2017-07-13 DIAGNOSIS — H5210 Myopia, unspecified eye: Secondary | ICD-10-CM | POA: Diagnosis not present

## 2017-07-16 ENCOUNTER — Encounter: Payer: Self-pay | Admitting: Family Medicine

## 2017-07-16 ENCOUNTER — Ambulatory Visit (INDEPENDENT_AMBULATORY_CARE_PROVIDER_SITE_OTHER): Payer: BLUE CROSS/BLUE SHIELD | Admitting: Family Medicine

## 2017-07-16 VITALS — BP 160/100 | HR 75 | Ht 66.0 in | Wt 227.0 lb

## 2017-07-16 DIAGNOSIS — I10 Essential (primary) hypertension: Secondary | ICD-10-CM | POA: Diagnosis not present

## 2017-07-16 MED ORDER — LOSARTAN POTASSIUM 50 MG PO TABS: 50.0000 mg | ORAL_TABLET | Freq: Every day | ORAL | 1 refills | Status: DC

## 2017-07-16 MED ORDER — AMLODIPINE BESYLATE 5 MG PO TABS: 5.0000 mg | ORAL_TABLET | Freq: Every day | ORAL | 1 refills | Status: DC

## 2017-07-16 NOTE — Patient Instructions (Signed)
Start back on medications.  Cut back on fried foods, fast foods, and foods containing a lot of sodium.  Start getting some exercise.  Call me in 2 weeks with your blood pressure readings.   Come back in 4 weeks with your blood pressure cuff and readings.    DASH Eating Plan DASH stands for "Dietary Approaches to Stop Hypertension." The DASH eating plan is a healthy eating plan that has been shown to reduce high blood pressure (hypertension). It may also reduce your risk for type 2 diabetes, heart disease, and stroke. The DASH eating plan may also help with weight loss. What are tips for following this plan? General guidelines  Avoid eating more than 2,300 mg (milligrams) of salt (sodium) a day. If you have hypertension, you may need to reduce your sodium intake to 1,500 mg a day.  Limit alcohol intake to no more than 1 drink a day for nonpregnant women and 2 drinks a day for men. One drink equals 12 oz of beer, 5 oz of wine, or 1 oz of hard liquor.  Work with your health care provider to maintain a healthy body weight or to lose weight. Ask what an ideal weight is for you.  Get at least 30 minutes of exercise that causes your heart to beat faster (aerobic exercise) most days of the week. Activities may include walking, swimming, or biking.  Work with your health care provider or diet and nutrition specialist (dietitian) to adjust your eating plan to your individual calorie needs. Reading food labels  Check food labels for the amount of sodium per serving. Choose foods with less than 5 percent of the Daily Value of sodium. Generally, foods with less than 300 mg of sodium per serving fit into this eating plan.  To find whole grains, look for the word "whole" as the first word in the ingredient list. Shopping  Buy products labeled as "low-sodium" or "no salt added."  Buy fresh foods. Avoid canned foods and premade or frozen meals. Cooking  Avoid adding salt when cooking. Use salt-free  seasonings or herbs instead of table salt or sea salt. Check with your health care provider or pharmacist before using salt substitutes.  Do not fry foods. Cook foods using healthy methods such as baking, boiling, grilling, and broiling instead.  Cook with heart-healthy oils, such as olive, canola, soybean, or sunflower oil. Meal planning   Eat a balanced diet that includes: ? 5 or more servings of fruits and vegetables each day. At each meal, try to fill half of your plate with fruits and vegetables. ? Up to 6-8 servings of whole grains each day. ? Less than 6 oz of lean meat, poultry, or fish each day. A 3-oz serving of meat is about the same size as a deck of cards. One egg equals 1 oz. ? 2 servings of low-fat dairy each day. ? A serving of nuts, seeds, or beans 5 times each week. ? Heart-healthy fats. Healthy fats called Omega-3 fatty acids are found in foods such as flaxseeds and coldwater fish, like sardines, salmon, and mackerel.  Limit how much you eat of the following: ? Canned or prepackaged foods. ? Food that is high in trans fat, such as fried foods. ? Food that is high in saturated fat, such as fatty meat. ? Sweets, desserts, sugary drinks, and other foods with added sugar. ? Full-fat dairy products.  Do not salt foods before eating.  Try to eat at least 2 vegetarian meals each  week.  Eat more home-cooked food and less restaurant, buffet, and fast food.  When eating at a restaurant, ask that your food be prepared with less salt or no salt, if possible. What foods are recommended? The items listed may not be a complete list. Talk with your dietitian about what dietary choices are best for you. Grains Whole-grain or whole-wheat bread. Whole-grain or whole-wheat pasta. Brown rice. [place]ena Morrow. Bulgur. Whole-grain and low-sodium cereals. Pita bread. Low-fat, low-sodium crackers. Whole-wheat flour tortillas. Vegetables Fresh or frozen vegetables (raw, steamed, roasted,  or grilled). Low-sodium or reduced-sodium tomato and vegetable juice. Low-sodium or reduced-sodium tomato sauce and tomato paste. Low-sodium or reduced-sodium canned vegetables. Fruits All fresh, dried, or frozen fruit. Canned fruit in natural juice (without added sugar). Meat and other protein foods Skinless chicken or [place]. Ground chicken or [place]. Pork with fat trimmed off. Fish and seafood. Egg whites. Dried beans, peas, or lentils. Unsalted nuts, nut butters, and seeds. Unsalted canned beans. Lean cuts of beef with fat trimmed off. Low-sodium, lean deli meat. Dairy Low-fat (1%) or fat-free (skim) milk. Fat-free, low-fat, or reduced-fat cheeses. Nonfat, low-sodium ricotta or cottage cheese. Low-fat or nonfat yogurt. Low-fat, low-sodium cheese. Fats and oils Soft margarine without trans fats. Vegetable oil. Low-fat, reduced-fat, or light mayonnaise and salad dressings (reduced-sodium). Canola, safflower, olive, soybean, and sunflower oils. Avocado. Seasoning and other foods Herbs. Spices. Seasoning mixes without salt. Unsalted popcorn and pretzels. Fat-free sweets. What foods are not recommended? The items listed may not be a complete list. Talk with your dietitian about what dietary choices are best for you. Grains Baked goods made with fat, such as croissants, muffins, or some breads. Dry pasta or rice meal packs. Vegetables Creamed or fried vegetables. Vegetables in a cheese sauce. Regular canned vegetables (not low-sodium or reduced-sodium). Regular canned tomato sauce and paste (not low-sodium or reduced-sodium). Regular tomato and vegetable juice (not low-sodium or reduced-sodium). Angie Fava. Olives. Fruits Canned fruit in a light or heavy syrup. Fried fruit. Fruit in cream or butter sauce. Meat and other protein foods Fatty cuts of meat. Ribs. Fried meat. Berniece Salines. Sausage. Bologna and other processed lunch meats. Salami. Fatback. Hotdogs. Bratwurst. Salted nuts and seeds. Canned beans  with added salt. Canned or smoked fish. Whole eggs or egg yolks. Chicken or [place] with skin. Dairy Whole or 2% milk, cream, and half-and-half. Whole or full-fat cream cheese. Whole-fat or sweetened yogurt. Full-fat cheese. Nondairy creamers. Whipped toppings. Processed cheese and cheese spreads. Fats and oils Butter. Stick margarine. Lard. Shortening. Ghee. Bacon fat. Tropical oils, such as coconut, palm kernel, or palm oil. Seasoning and other foods Salted popcorn and pretzels. Onion salt, garlic salt, seasoned salt, table salt, and sea salt. Worcestershire sauce. Tartar sauce. Barbecue sauce. Teriyaki sauce. Soy sauce, including reduced-sodium. Steak sauce. Canned and packaged gravies. Fish sauce. Oyster sauce. Cocktail sauce. Horseradish that you find on the shelf. Ketchup. Mustard. Meat flavorings and tenderizers. Bouillon cubes. Hot sauce and Tabasco sauce. Premade or packaged marinades. Premade or packaged taco seasonings. Relishes. Regular salad dressings. Where to find more information:  National Heart, Lung, and [place]: https://wilson-eaton.com/  American Heart Association: www.heart.org Summary  The DASH eating plan is a healthy eating plan that has been shown to reduce high blood pressure (hypertension). It may also reduce your risk for type 2 diabetes, heart disease, and stroke.  With the DASH eating plan, you should limit salt (sodium) intake to 2,300 mg a day. If you have hypertension, you may need to reduce your  sodium intake to 1,500 mg a day.  When on the DASH eating plan, aim to eat more fresh fruits and vegetables, whole grains, lean proteins, low-fat dairy, and heart-healthy fats.  Work with your health care provider or diet and nutrition specialist (dietitian) to adjust your eating plan to your individual calorie needs. This information is not intended to replace advice given to you by your health care provider. Make sure you discuss any questions you have with your health  care provider. Document Released: 09/14/2011 Document Revised: 09/18/2016 Document Reviewed: 09/18/2016 Elsevier Interactive Patient Education  2017 [employer].

## 2017-07-16 NOTE — Progress Notes (Signed)
   Subjective:    Patient ID: Mia Davis, female    DOB: 1965-12-25, 51 y.o.   MRN: [id number]  HPI Chief Complaint  Patient presents with  . new pt    new pt- had issues with bp for 2 years. went to dentist last week and bp was 184/112. she has been on bp med in the past   She is new to the practice and here to establish care.  Previous medical care: Sharon Seller, NP at [facility] since 05/2016.  She went to [facility] in the past as well.   History of HTN. Diagnosed 1-2 years ago. States she has been out of medication since February 2018.  She went to her dentist and her BP was elevated so she was informed that she needed to see a PCP to address this.  Per EMR, she has taken amlodipine 10 mg and losartan 100 mg in the past.  Did not tolerate lisinopril in the past per records, states she had a cough.  States she does not want to take HCTZ, states her sister is a Marine scientist and told her it makes hair fall out   Also complains of fatigue. Sleepy during the day. no history of sleep apnea.  States she has some days with constant dull bilateral parietal headaches. She takes NSAIDs as needed for this.   Denies fever, chills, dizziness, chest pain, palpitations, shortness of breath, abdominal pain, N/V/D, urinary symptoms, LE edema.   Has OB/GYN - underwent a total abdominal hysterectomy with bilateral salpingectomy on 09/12/2016. She had symptomatic fibroid uterus.    Diet: unhealthy Exercise: none   Does not smoke.   Divorced. 2 kids. 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   Reviewed allergies, medications, past medical, surgical, and social history.   Review of Systems Pertinent positives and negatives in the history of present illness.     Objective:   Physical Exam  Constitutional: She is oriented to person, place, and time. She appears well-developed and well-nourished. No distress.  Eyes: Pupils are equal, round, and reactive to light. Conjunctivae are normal.    Neck: Normal range of motion. Neck supple. No JVD present. No thyromegaly present.  Cardiovascular: Normal rate, regular rhythm, normal heart sounds and intact distal pulses.   Pulmonary/Chest: Effort normal and breath sounds normal.  Lymphadenopathy:    She has no cervical adenopathy.  Neurological: She is alert and oriented to person, place, and time. She has normal reflexes. No cranial nerve deficit. Coordination normal.  Skin: Skin is warm and dry. No pallor.  Psychiatric: She has a normal mood and affect. Her behavior is normal. Thought content normal.   BP (!) 160/100   Pulse 75   Ht 5\' 6"  (1.676 m)   Wt 227 lb (103 kg)   LMP 07/13/2016   BMI 36.64 kg/m       Assessment & Plan:  Essential hypertension - Plan: amLODipine (NORVASC) 5 MG tablet, CBC with Differential/Platelet, COMPLETE METABOLIC PANEL WITH GFR  Counseled on potential health consequences of uncontrolled HTN.  Counseled on healthy diet, exercise and lifestyle.  Discussed that her BMI places her in the obese category.  DASH handout given.  Plan to start her back on medication and have her keep an eye on her BP at home. She will call in 2 weeks with her readings and return in 4 weeks with her cuff and readings.  Check labs and follow up.  She is overdue for a fasting CPE.

## 2017-07-17 LAB — COMPLETE METABOLIC PANEL WITH GFR
AG RATIO: 1.3 (calc) (ref 1.0–2.5)
ALBUMIN MSPROF: 3.9 g/dL (ref 3.6–5.1)
ALKALINE PHOSPHATASE (APISO): 73 U/L (ref 33–130)
ALT: 11 U/L (ref 6–29)
AST: 14 U/L (ref 10–35)
BILIRUBIN TOTAL: 1.1 mg/dL (ref 0.2–1.2)
BUN / CREAT RATIO: 14 (calc) (ref 6–22)
BUN: 15 mg/dL (ref 7–25)
CHLORIDE: 105 mmol/L (ref 98–110)
CO2: 26 mmol/L (ref 20–32)
Calcium: 8.9 mg/dL (ref 8.6–10.4)
Creat: 1.09 mg/dL — ABNORMAL HIGH (ref 0.50–1.05)
GFR, Est African American: 68 mL/min/{1.73_m2} (ref >=60)
GFR, Est Non African American: 58 mL/min/{1.73_m2} — ABNORMAL LOW (ref >=60)
GLOBULIN: 3.1 g/dL (ref 1.9–3.7)
Glucose, Bld: 88 mg/dL (ref 65–99)
POTASSIUM: 3.7 mmol/L (ref 3.5–5.3)
SODIUM: 138 mmol/L (ref 135–146)
Total Protein: 7 g/dL (ref 6.1–8.1)

## 2017-07-17 LAB — CBC WITH DIFFERENTIAL/PLATELET
BASOS PCT: 0.5 %
Basophils Absolute: 43 cells/uL (ref 0–200)
EOS ABS: 163 {cells}/uL (ref 15–500)
EOS PCT: 1.9 %
HEMATOCRIT: 42.3 % (ref 35.0–45.0)
HEMOGLOBIN: 14 g/dL (ref 11.7–15.5)
LYMPHS ABS: 3087 {cells}/uL (ref 850–3900)
MCH: 25.1 pg — ABNORMAL LOW (ref 27.0–33.0)
MCHC: 33.1 g/dL (ref 32.0–36.0)
MCV: 75.9 fL — ABNORMAL LOW (ref 80.0–100.0)
MONOS PCT: 7.7 %
MPV: 11.3 fL (ref 7.5–12.5)
NEUTROS ABS: 4644 {cells}/uL (ref 1500–7800)
Neutrophils Relative %: 54 %
Platelets: 308 10*3/uL (ref 140–400)
RBC: 5.57 10*6/uL — ABNORMAL HIGH (ref 3.80–5.10)
RDW: 15.9 % — ABNORMAL HIGH (ref 11.0–15.0)
Total Lymphocyte: 35.9 %
WBC mixed population: 662 cells/uL (ref 200–950)
WBC: 8.6 10*3/uL (ref 3.8–10.8)

## 2017-08-01 ENCOUNTER — Encounter: Payer: Self-pay | Admitting: Family Medicine

## 2017-08-01 MED ORDER — LOSARTAN POTASSIUM 100 MG PO TABS: 100.0000 mg | ORAL_TABLET | Freq: Every day | ORAL | 1 refills | Status: DC

## 2017-08-15 ENCOUNTER — Ambulatory Visit: Payer: BLUE CROSS/BLUE SHIELD | Admitting: Family Medicine

## 2017-08-20 ENCOUNTER — Encounter: Payer: BLUE CROSS/BLUE SHIELD | Admitting: Family Medicine

## 2017-08-21 ENCOUNTER — Ambulatory Visit: Payer: BLUE CROSS/BLUE SHIELD | Admitting: Family Medicine

## 2017-08-21 ENCOUNTER — Encounter: Payer: Self-pay | Admitting: Internal Medicine

## 2017-08-21 ENCOUNTER — Encounter: Payer: Self-pay | Admitting: Family Medicine

## 2017-08-21 VITALS — BP 160/90 | HR 69 | Ht 66.0 in | Wt 229.6 lb

## 2017-08-21 DIAGNOSIS — E669 Obesity, unspecified: Secondary | ICD-10-CM

## 2017-08-21 DIAGNOSIS — R933 Abnormal findings on diagnostic imaging of other parts of digestive tract: Secondary | ICD-10-CM | POA: Diagnosis not present

## 2017-08-21 DIAGNOSIS — I10 Essential (primary) hypertension: Secondary | ICD-10-CM

## 2017-08-21 DIAGNOSIS — Z0000 Encounter for general adult medical examination without abnormal findings: Secondary | ICD-10-CM

## 2017-08-21 DIAGNOSIS — Z1159 Encounter for screening for other viral diseases: Secondary | ICD-10-CM

## 2017-08-21 LAB — POCT URINALYSIS DIP (PROADVANTAGE DEVICE)
BILIRUBIN UA: NEGATIVE
BILIRUBIN UA: NEGATIVE mg/dL
Blood, UA: NEGATIVE
Glucose, UA: NEGATIVE mg/dL
LEUKOCYTES UA: NEGATIVE
Nitrite, UA: NEGATIVE
PH UA: 6 (ref 5.0–8.0)
PROTEIN UA: NEGATIVE mg/dL
SPECIFIC GRAVITY, URINE: 1.025
Urobilinogen, Ur: NEGATIVE

## 2017-08-21 MED ORDER — AMLODIPINE BESYLATE 10 MG PO TABS: 10.0000 mg | ORAL_TABLET | Freq: Every day | ORAL | 0 refills | Status: DC

## 2017-08-21 NOTE — Progress Notes (Signed)
Subjective:    Patient ID: Mia Davis, female    DOB: [birth year], 51 y.o.   MRN: [id number]  HPI Chief Complaint  Patient presents with  . fasting cpe    fasting cpe and follow-up on htn. eye exam done october 2018   She is here for a complete physical exam and to follow up on HTN.  Previous medical care: at [facility] for the past 20 years.   HTN- states she was diagnosed approximately 1-2 years ago. She was without medication for HTN from February 2018 until last month when she established care with [place]. We started her back on amlodipine and losartan last month since she was taking these medication previously without any issues. One week ago she called and her BP was 160/100 at home so we increased her losartan dose.  States her readings at home have been in the 160s/90s.  She is not eating healthy, states she only eats one meal per day. She is not exercising.   States her son was killed in June. States she is doing better emotionally now and is finally able to sleep.    Reports having intermittent bilateral frontal headaches. States headaches are usually mild, not associated with anything or any time of day. States she occasionally takes Tylenol or ibuprofen as needed.   Other providers: [facility]   Social history: Lives alone , works in [employer] at [employer] in [place].  Denies smoking, drinking alcohol, drug use  Diet: unhealthy. Eats only 1 meal per day.  Excerise: nothing   Immunizations: flu shot up to date. Tdap- thinks this was in the past 10 years. Done at [facility].   Declines STD testing.   Health maintenance:  Mammogram: 09/15/2017  Colonoscopy: age 69. [facility].  Last Menstrual cycle: hysterectomy in 09/2016 for fibroids and heavy bleeding.  Last Dental Exam: appointment tomorrow.  Last Eye Exam: October 2018  Wears seatbelt always, smoke detectors in home and functioning, does not text while driving and feels safe in home environment.   Reviewed allergies,  medications, past medical, surgical, family, and social history.   Review of Systems Review of Systems Constitutional: -fever, -chills, -sweats, -unexpected weight change,-fatigue ENT: -runny nose, -ear pain, -sore throat Cardiology:  -chest pain, -palpitations, -edema Respiratory: -cough, -shortness of breath, -wheezing Gastroenterology: -abdominal pain, -nausea, -vomiting, -diarrhea, -constipation  Hematology: -bleeding or bruising problems Musculoskeletal: -arthralgias, -myalgias, -joint swelling, -back pain Ophthalmology: -vision changes Urology: -dysuria, -difficulty urinating, -hematuria, -urinary frequency, -urgency Neurology: + intermittent bilateral frontal headaches, -weakness, -tingling, -numbness       Objective:   Physical Exam BP (!) 160/90   Pulse 69   Ht 5\' 6"  (1.676 m)   Wt 229 lb 9.6 oz (104.1 kg)   LMP 07/13/2016   BMI 37.06 kg/m   General Appearance:    Alert, cooperative, no distress, appears stated age  Head:    Normocephalic, without obvious abnormality, atraumatic  Eyes:    PERRL, conjunctiva/corneas clear, EOM's intact, fundi    benign  Ears:    Normal TM's and external ear canals  Nose:   Nares normal, mucosa normal, no drainage or sinus   tenderness  Throat:   Lips, mucosa, and tongue normal; teeth and gums normal  Neck:   Supple, no lymphadenopathy;  thyroid:  no   enlargement/tenderness/nodules; no carotid   bruit or JVD  Back:    Spine nontender, no curvature, ROM normal, no CVA     tenderness  Lungs:  Clear to auscultation bilaterally without wheezes, rales or     ronchi; respirations unlabored  Chest Wall:    No tenderness or deformity   Heart:    Regular rate and rhythm, S1 and S2 normal, no murmur, rub   or gallop  Breast Exam:    Declines. Mammogram scheduled.   Abdomen:     Soft, non-tender, nondistended, normoactive bowel sounds,    no masses, no hepatosplenomegaly  Genitalia:    Declines. Total hysterectomy 09/2016       Extremities:   No clubbing, cyanosis or edema  Pulses:   2+ and symmetric all extremities  Skin:   Skin color, texture, turgor normal, no rashes or lesions  Lymph nodes:   Cervical, supraclavicular, and axillary nodes normal  Neurologic:   CNII-XII intact, normal strength, sensation and gait; reflexes 2+ and symmetric throughout          Psych:   Normal mood, affect, hygiene and grooming.    Urinalysis dipstick: negative       Assessment & Plan:  Routine general medical examination at a health care facility - Plan: POCT Urinalysis DIP (Proadvantage Device), TSH, Lipid panel  Essential hypertension - Plan: TSH  Abnormal colonoscopy - done at [facility] - Plan: Ambulatory referral to Gastroenterology  Obesity (BMI 30-39.9) - Plan: TSH, Lipid panel  Need for hepatitis C screening test - Plan: Hepatitis C antibody  Her BP is not at goal. Will increase amlodipine to 10 mg daily. Continue on other medication. Counseled on healthy lifestyle to manage HTN and other health conditions.  She will keep an eye on her BP at home. Follow up for HTN in 4 weeks.  Emotional and physical health overall is fine.  She is due for return colonoscopy per patient. States she cannot return to [facility] and would like a referral to a new GI.  One time Hep C testing done.  Immunizations up to date per patient. No records available. Awaiting records from [facility].  Follow up pending labs.

## 2017-08-21 NOTE — Patient Instructions (Addendum)
Go to the American Heart Association website and look up hypertension.   Your BP today is still elevated at 160/90. Continue checking your BP daily for now. Goal reading is 130/80 or less.  Increase amlodipine to 10 mg once daily. Continue on your other medication.  Start eating small meals daily and make healthy food choices. Look at the nutrition labels and eat low sodium.  Start walking everyday. The GI office will call you.    Bring in your BP machine and readings in 4 weeks.   Get your mammogram as scheduled.   We will call you with your lab results.   Preventative Care for Adults - Female      MAINTAIN REGULAR HEALTH EXAMS:  A routine yearly physical is a good way to check in with your primary care provider about your health and preventive screening. It is also an opportunity to share updates about your health and any concerns you have, and receive a thorough all-over exam.   Most health insurance companies pay for at least some preventative services.  Check with your health plan for specific coverages.  WHAT PREVENTATIVE SERVICES DO WOMEN NEED?  Adult women should have their weight and blood pressure checked regularly.   Women age 58 and older should have their cholesterol levels checked regularly.  Women should be screened for cervical cancer with a Pap smear and pelvic exam beginning at either age 57, or 3 years after they become sexually activity.    Breast cancer screening generally begins at age 30 with a mammogram and breast exam by your primary care provider.    Beginning at age 67 and continuing to age 85, women should be screened for colorectal cancer.  Certain people may need continued testing until age 69.  Updating vaccinations is part of preventative care.  Vaccinations help protect against diseases such as the flu.  Osteoporosis is a disease in which the bones lose minerals and strength as we age. Women ages 41 and over should discuss this with their  caregivers, as should women after menopause who have other risk factors.  Lab tests are generally done as part of preventative care to screen for anemia and blood disorders, to screen for problems with the kidneys and liver, to screen for bladder problems, to check blood sugar, and to check your cholesterol level.  Preventative services generally include counseling about diet, exercise, avoiding tobacco, drugs, excessive alcohol consumption, and sexually transmitted infections.    GENERAL RECOMMENDATIONS FOR GOOD HEALTH:  Healthy diet:  Eat a variety of foods, including fruit, vegetables, animal or vegetable protein, such as meat, fish, chicken, and eggs, or beans, lentils, tofu, and grains, such as rice.  Drink plenty of water daily.  Decrease saturated fat in the diet, avoid lots of red meat, processed foods, sweets, fast foods, and fried foods.  Exercise:  Aerobic exercise helps maintain good heart health. At least 30-40 minutes of moderate-intensity exercise is recommended. For example, a brisk walk that increases your heart rate and breathing. This should be done on most days of the week.   Find a type of exercise or a variety of exercises that you enjoy so that it becomes a part of your daily life.  Examples are running, walking, swimming, water aerobics, and biking.  For motivation and support, explore group exercise such as aerobic class, spin class, Zumba, Yoga,or  martial arts, etc.    Set exercise goals for yourself, such as a certain weight goal, walk or run in  a race such as a 5k walk/run.  Speak to your primary care provider about exercise goals.  Disease prevention:  If you smoke or chew tobacco, find out from your caregiver how to quit. It can literally save your life, no matter how long you have been a tobacco user. If you do not use tobacco, never begin.   Maintain a healthy diet and normal weight. Increased weight leads to problems with blood pressure and diabetes.    The Body Mass Index or BMI is a way of measuring how much of your body is fat. Having a BMI above 27 increases the risk of heart disease, diabetes, hypertension, stroke and other problems related to obesity. Your caregiver can help determine your BMI and based on it develop an exercise and dietary program to help you achieve or maintain this important measurement at a healthful level.  High blood pressure causes heart and blood vessel problems.  Persistent high blood pressure should be treated with medicine if weight loss and exercise do not work.   Fat and cholesterol leaves deposits in your arteries that can block them. This causes heart disease and vessel disease elsewhere in your body.  If your cholesterol is found to be high, or if you have heart disease or certain other medical conditions, then you may need to have your cholesterol monitored frequently and be treated with medication.   Ask if you should have a cardiac stress test if your history suggests this. A stress test is a test done on a treadmill that looks for heart disease. This test can find disease prior to there being a problem.  Menopause can be associated with physical symptoms and risks. Hormone replacement therapy is available to decrease these. You should talk to your caregiver about whether starting or continuing to take hormones is right for you.   Osteoporosis is a disease in which the bones lose minerals and strength as we age. This can result in serious bone fractures. Risk of osteoporosis can be identified using a bone density scan. Women ages 61 and over should discuss this with their caregivers, as should women after menopause who have other risk factors. Ask your caregiver whether you should be taking a calcium supplement and Vitamin D, to reduce the rate of osteoporosis.   Avoid drinking alcohol in excess (more than two drinks per day).  Avoid use of street drugs. Do not share needles with anyone. Ask for professional  help if you need assistance or instructions on stopping the use of alcohol, cigarettes, and/or drugs.  Brush your teeth twice a day with fluoride toothpaste, and floss once a day. Good oral hygiene prevents tooth decay and gum disease. The problems can be painful, unattractive, and can cause other health problems. Visit your dentist for a routine oral and dental check up and preventive care every 6-12 months.   Look at your skin regularly.  Use a mirror to look at your back. Notify your caregivers of changes in moles, especially if there are changes in shapes, colors, a size larger than a pencil eraser, an irregular border, or development of new moles.  Safety:  Use seatbelts 100% of the time, whether driving or as a passenger.  Use safety devices such as hearing protection if you work in environments with loud noise or significant background noise.  Use safety glasses when doing any work that could send debris in to the eyes.  Use a helmet if you ride a bike or motorcycle.  Use appropriate  safety gear for contact sports.  Talk to your caregiver about gun safety.  Use sunscreen with a SPF (or skin protection factor) of 15 or greater.  Lighter skinned people are at a greater risk of skin cancer. Don't forget to also wear sunglasses in order to protect your eyes from too much damaging sunlight. Damaging sunlight can accelerate cataract formation.   Practice safe sex. Use condoms. Condoms are used for birth control and to help reduce the spread of sexually transmitted infections (or STIs).  Some of the STIs are gonorrhea (the clap), chlamydia, syphilis, trichomonas, herpes, HPV (human papilloma virus) and HIV (human immunodeficiency virus) which causes AIDS. The herpes, HIV and HPV are viral illnesses that have no cure. These can result in disability, cancer and death.   Keep carbon monoxide and smoke detectors in your home functioning at all times. Change the batteries every 6 months or use a model that  plugs into the wall.   Vaccinations:  Stay up to date with your tetanus shots and other required immunizations. You should have a booster for tetanus every 10 years. Be sure to get your flu shot every year, since 5%-20% of the [place] population comes down with the flu. The flu vaccine changes each year, so being vaccinated once is not enough. Get your shot in the fall, before the flu season peaks.   Other vaccines to consider:  Human Papilloma Virus or HPV causes cancer of the cervix, and other infections that can be transmitted from person to person. There is a vaccine for HPV, and females should get immunized between the ages of 31 and 65. It requires a series of 3 shots.   Pneumococcal vaccine to protect against certain types of pneumonia.  This is normally recommended for adults age 1 or older.  However, adults younger than 53 years old with certain underlying conditions such as diabetes, heart or lung disease should also receive the vaccine.  Shingles vaccine to protect against Varicella Zoster if you are older than age 71, or younger than 53 years old with certain underlying illness.  Hepatitis A vaccine to protect against a form of infection of the liver by a virus acquired from food.  Hepatitis B vaccine to protect against a form of infection of the liver by a virus acquired from blood or body fluids, particularly if you work in health care.  If you plan to travel internationally, check with your local health department for specific vaccination recommendations.  Cancer Screening:  Breast cancer screening is essential to preventive care for women. All women age 49 and older should perform a breast self-exam every month. At age 50 and older, women should have their caregiver complete a breast exam each year. Women at ages 69 and older should have a mammogram (x-ray film) of the breasts. Your caregiver can discuss how often you need mammograms.    Cervical cancer screening includes taking  a Pap smear (sample of cells examined under a microscope) from the cervix (end of the uterus). It also includes testing for HPV (Human Papilloma Virus, which can cause cervical cancer). Screening and a pelvic exam should begin at age 73, or 3 years after a woman becomes sexually active. Screening should occur every year, with a Pap smear but no HPV testing, up to age 30. After age 64, you should have a Pap smear every 3 years with HPV testing, if no HPV was found previously.   Most routine colon cancer screening begins at the age of  50. On a yearly basis, doctors may provide special easy to use take-home tests to check for hidden blood in the stool. Sigmoidoscopy or colonoscopy can detect the earliest forms of colon cancer and is life saving. These tests use a small camera at the end of a tube to directly examine the colon. Speak to your caregiver about this at age 57, when routine screening begins (and is repeated every 5 years unless early forms of pre-cancerous polyps or small growths are found).

## 2017-08-22 LAB — HEPATITIS C ANTIBODY
Hepatitis C Ab: NONREACTIVE
SIGNAL TO CUT-OFF: 0.01 (ref <1.00)

## 2017-08-22 LAB — LIPID PANEL
CHOL/HDL RATIO: 3 (calc) (ref <5.0)
Cholesterol: 136 mg/dL (ref <200)
HDL: 45 mg/dL — ABNORMAL LOW (ref >50)
LDL CHOLESTEROL (CALC): 73 mg/dL
NON-HDL CHOLESTEROL (CALC): 91 mg/dL (ref <130)
TRIGLYCERIDES: 93 mg/dL (ref <150)

## 2017-08-22 LAB — TSH: TSH: 1.58 mIU/L

## 2017-08-27 ENCOUNTER — Telehealth: Payer: Self-pay | Admitting: Family Medicine

## 2017-08-27 NOTE — Telephone Encounter (Signed)
Records received from [facility] practice in your folder for review

## 2017-09-09 ENCOUNTER — Other Ambulatory Visit: Payer: Self-pay | Admitting: Family Medicine

## 2017-09-09 DIAGNOSIS — I10 Essential (primary) hypertension: Secondary | ICD-10-CM

## 2017-09-11 ENCOUNTER — Encounter: Payer: Self-pay | Admitting: Family Medicine

## 2017-09-14 ENCOUNTER — Encounter: Payer: Self-pay | Admitting: Family Medicine

## 2017-09-14 NOTE — Progress Notes (Signed)
a 

## 2017-09-15 ENCOUNTER — Other Ambulatory Visit: Payer: Self-pay | Admitting: Family Medicine

## 2017-09-15 DIAGNOSIS — Z803 Family history of malignant neoplasm of breast: Secondary | ICD-10-CM | POA: Diagnosis not present

## 2017-09-15 DIAGNOSIS — Z1231 Encounter for screening mammogram for malignant neoplasm of breast: Secondary | ICD-10-CM | POA: Diagnosis not present

## 2017-09-15 LAB — HM MAMMOGRAPHY

## 2017-09-18 ENCOUNTER — Encounter: Payer: Self-pay | Admitting: Family Medicine

## 2017-09-19 ENCOUNTER — Ambulatory Visit: Payer: BLUE CROSS/BLUE SHIELD | Admitting: Family Medicine

## 2017-09-23 ENCOUNTER — Encounter: Payer: Self-pay | Admitting: Family Medicine

## 2017-09-24 ENCOUNTER — Encounter: Payer: Self-pay | Admitting: Family Medicine

## 2017-09-24 ENCOUNTER — Ambulatory Visit: Payer: BLUE CROSS/BLUE SHIELD | Admitting: Family Medicine

## 2017-09-24 VITALS — BP 130/84 | HR 76 | Temp 97.8°F | Ht 66.0 in | Wt 226.6 lb

## 2017-09-24 DIAGNOSIS — J029 Acute pharyngitis, unspecified: Secondary | ICD-10-CM | POA: Diagnosis not present

## 2017-09-24 LAB — POCT RAPID STREP A (OFFICE): RAPID STREP A SCREEN: NEGATIVE

## 2017-09-24 NOTE — Patient Instructions (Signed)
Your strep test is negative. I suspect you have a virus.   Do salt water gargles and take ibuprofen. Call if you if you have any new or worsening symptoms.

## 2017-09-24 NOTE — Progress Notes (Signed)
Chief Complaint  Patient presents with  . Sore Throat    since Saturday, no other symptoms.     Subjective:  Mia Davis is a 51 y.o. female who presents for evaluation of a 2-3 day history of sore throat.  She has not had a recent close exposure to someone with proven streptococcal pharyngitis.  Associated symptoms include left ear pain.   Denies fever, chills, headache, nasal congestion, rhinorrhea, chest pain, palpitations, shortness of breath, cough, abdominal pain, N/V/D.   Treatment to date: Nyquil.  ? sick contacts.  No other aggravating or relieving factors.  No other c/o.  The following portions of the patient's history were reviewed and updated as appropriate: allergies, current medications, past medical history, past social history, past surgical history and problem list.  ROS as in subjective   Objective: Vitals:   09/24/17 1005  BP: 130/84  Pulse: 76  Temp: 97.8 F (36.6 C)    General appearance: no distress, WD/WN, well -appearing HEENT: normocephalic, conjunctiva/corneas normal, sclerae anicteric, nares patent, no discharge or erythema, pharynx with erythema, without exudate or edema.  Oral cavity: MMM, no lesions  Neck: supple, no lymphadenopathy, no thyromegaly Heart: RRR, normal S1, S2, no murmurs Lungs: CTA bilaterally, no wheezes, rhonchi, or rales    Laboratory Strep test done. Results:negative.    Assessment and Plan: Acute pharyngitis, unspecified etiology  Sore throat - Plan: Rapid Strep A   Advised that symptoms and exam suggest a viral etiology.  Discussed symptomatic treatment including salt water gargles, warm fluids, rest, hydrate well, can use over-the-counter Tylenol or ibuprofen for throat pain, fever, or malaise. If worse or not improving within 2-3 days, call or return.

## 2017-10-02 ENCOUNTER — Other Ambulatory Visit: Payer: Self-pay | Admitting: Family Medicine

## 2017-10-28 ENCOUNTER — Other Ambulatory Visit: Payer: Self-pay | Admitting: Family Medicine

## 2017-10-29 ENCOUNTER — Telehealth: Payer: Self-pay

## 2017-10-29 NOTE — Telephone Encounter (Signed)
Called pt to inform her that a 30 day script for her b/p med. Pt needs an appointment made soon with Vickie . Thanks [employer]

## 2017-11-01 ENCOUNTER — Telehealth: Payer: Self-pay | Admitting: Gastroenterology

## 2017-11-05 NOTE — Telephone Encounter (Signed)
Spoke to Mia Davis but she must call back later to schedule Direct Colon.

## 2017-11-07 ENCOUNTER — Encounter: Payer: Self-pay | Admitting: Gastroenterology

## 2017-12-07 ENCOUNTER — Other Ambulatory Visit: Payer: Self-pay

## 2017-12-07 ENCOUNTER — Ambulatory Visit (AMBULATORY_SURGERY_CENTER): Payer: Self-pay

## 2017-12-07 VITALS — Ht 66.0 in | Wt 227.8 lb

## 2017-12-07 DIAGNOSIS — Z86010 Personal history of colonic polyps: Secondary | ICD-10-CM

## 2017-12-07 NOTE — Progress Notes (Signed)
No egg or soy allergy known to patient  No issues with past sedation with any surgeries  or procedures, no intubation problems  No diet pills per patient No home 02 use per patient  No blood thinners per patient  Pt denies issues with constipation  No A fib or A flutter  EMMI video sent to pt's e mail sent during previsit.

## 2017-12-13 ENCOUNTER — Other Ambulatory Visit: Payer: Self-pay | Admitting: Family Medicine

## 2017-12-14 ENCOUNTER — Encounter: Payer: Self-pay | Admitting: Gastroenterology

## 2017-12-21 ENCOUNTER — Other Ambulatory Visit: Payer: Self-pay

## 2017-12-21 ENCOUNTER — Encounter: Payer: Self-pay | Admitting: Gastroenterology

## 2017-12-21 ENCOUNTER — Ambulatory Visit (AMBULATORY_SURGERY_CENTER): Payer: BLUE CROSS/BLUE SHIELD | Admitting: Gastroenterology

## 2017-12-21 VITALS — BP 118/69 | HR 74 | Temp 98.0°F | Resp 11 | Ht 66.0 in | Wt 227.0 lb

## 2017-12-21 DIAGNOSIS — Z86010 Personal history of colonic polyps: Secondary | ICD-10-CM | POA: Diagnosis present

## 2017-12-21 DIAGNOSIS — D120 Benign neoplasm of cecum: Secondary | ICD-10-CM

## 2017-12-21 DIAGNOSIS — D123 Benign neoplasm of transverse colon: Secondary | ICD-10-CM

## 2017-12-21 DIAGNOSIS — D125 Benign neoplasm of sigmoid colon: Secondary | ICD-10-CM

## 2017-12-21 MED ORDER — SODIUM CHLORIDE 0.9 % IV SOLN: 500.0000 mL | Freq: Once | INTRAVENOUS | Status: DC

## 2017-12-21 NOTE — Progress Notes (Signed)
Called to room to assist during endoscopic procedure.  Patient ID and intended procedure confirmed with present staff. Received instructions for my participation in the procedure from the performing physician.  

## 2017-12-21 NOTE — Patient Instructions (Signed)
Impression/recommedations:  Polyps (handout given) Diverticulosis (handout given)  No ibuprofen, naproxen or other non-steroidal anti-inflammatory drugs for 2 weeks. Tylenol only until 01/05/18  YOU HAD AN ENDOSCOPIC PROCEDURE TODAY AT [facility]:   Refer to the procedure report that was given to you for any specific questions about what was found during the examination.  If the procedure report does not answer your questions, please call your gastroenterologist to clarify.  If you requested that your care partner not be given the details of your procedure findings, then the procedure report has been included in a sealed envelope for you to review at your convenience later.  YOU SHOULD EXPECT: Some feelings of bloating in the abdomen. Passage of more gas than usual.  Walking can help get rid of the air that was put into your GI tract during the procedure and reduce the bloating. If you had a lower endoscopy (such as a colonoscopy or flexible sigmoidoscopy) you may notice spotting of blood in your stool or on the toilet paper. If you underwent a bowel prep for your procedure, you may not have a normal bowel movement for a few days.  Please Note:  You might notice some irritation and congestion in your nose or some drainage.  This is from the oxygen used during your procedure.  There is no need for concern and it should clear up in a day or so.  SYMPTOMS TO REPORT IMMEDIATELY:   Following lower endoscopy (colonoscopy or flexible sigmoidoscopy):  Excessive amounts of blood in the stool  Significant tenderness or worsening of abdominal pains  Swelling of the abdomen that is new, acute  Fever of 100F or higher  For urgent or emergent issues, a gastroenterologist can be reached at any hour by calling [phone].   DIET:  We do recommend a small meal at first, but then you may proceed to your regular diet.  Drink plenty of fluids but you should avoid alcoholic beverages for 24  hours.  ACTIVITY:  You should plan to take it easy for the rest of today and you should NOT DRIVE or use heavy machinery until tomorrow (because of the sedation medicines used during the test).    FOLLOW UP: Our staff will call the number listed on your records the next business day following your procedure to check on you and address any questions or concerns that you may have regarding the information given to you following your procedure. If we do not reach you, we will leave a message.  However, if you are feeling well and you are not experiencing any problems, there is no need to return our call.  We will assume that you have returned to your regular daily activities without incident.  If any biopsies were taken you will be contacted by phone or by letter within the next 1-3 weeks.  Please call us at [phone] if you have not heard about the biopsies in 3 weeks.    SIGNATURES/CONFIDENTIALITY: You and/or your care partner have signed paperwork which will be entered into your electronic medical record.  These signatures attest to the fact that that the information above on your After Visit Summary has been reviewed and is understood.  Full responsibility of the confidentiality of this discharge information lies with you and/or your care-partner.

## 2017-12-21 NOTE — Progress Notes (Signed)
A/ox3 pleased with MAC, report to [facility]

## 2017-12-21 NOTE — Op Note (Signed)
[employer] Patient Name: Mia Davis Procedure Date: 12/21/2017 12:57 PM MRN: [id number] Endoscopist: Remo Lipps P. Dorotha Hirschi MD, MD Age: 52 Referring MD:  Date of Birth: 07-Jul-1966 Gender: Female Account #: [id number] Procedure:                Colonoscopy Indications:              Surveillance: Personal history of adenomatous                            polyps on last colonoscopy 3 years ago Medicines:                Monitored Anesthesia Care Procedure:                Pre-Anesthesia Assessment:                           - Prior to the procedure, a History and Physical                            was performed, and patient medications and                            allergies were reviewed. The patient's tolerance of                            previous anesthesia was also reviewed. The risks                            and benefits of the procedure and the sedation                            options and risks were discussed with the patient.                            All questions were answered, and informed consent                            was obtained. Prior Anticoagulants: The patient has                            taken no previous anticoagulant or antiplatelet                            agents. ASA Grade Assessment: II - A patient with                            mild systemic disease. After reviewing the risks                            and benefits, the patient was deemed in                            satisfactory condition to undergo the procedure.  After obtaining informed consent, the colonoscope                            was passed under direct vision. Throughout the                            procedure, the patient's blood pressure, pulse, and                            oxygen saturations were monitored continuously. The                            Colonoscope was introduced through the anus and                            advanced to the  the terminal ileum, with                            identification of the appendiceal orifice and IC                            valve. The colonoscopy was performed without                            difficulty. The patient tolerated the procedure                            well. The quality of the bowel preparation was                            good. The ileocecal valve, appendiceal orifice, and                            rectum were photographed. Scope In: 1:07:03 PM Scope Out: 1:27:15 PM Scope Withdrawal Time: 0 hours 18 minutes 5 seconds  Total Procedure Duration: 0 hours 20 minutes 12 seconds  Findings:                 The perianal and digital rectal examinations were                            normal.                           The terminal ileum appeared normal.                           A 10 mm polyp was found in the cecum along the back                            side of the IC valve, but it did not invade the                            ileum. The polyp was sessile. The polyp was removed  with a cold snare. Resection and retrieval were                            complete.                           A 5 mm polyp was found in the transverse colon. The                            polyp was sessile. The polyp was removed with a                            cold snare. Resection and retrieval were complete.                           A 3 mm polyp was found in the sigmoid colon. The                            polyp was sessile. The polyp was removed with a                            cold snare. Resection and retrieval were complete.                           Multiple medium-mouthed diverticula were found in                            the sigmoid colon, transverse colon and ascending                            colon.                           The exam was otherwise without abnormality on                            direct and retroflexion views. Complications:             No immediate complications. Estimated blood loss:                            Minimal. Estimated Blood Loss:     Estimated blood loss was minimal. Impression:               - The examined portion of the ileum was normal.                           - One 10 mm polyp in the cecum, removed with a cold                            snare. Resected and retrieved.                           - One 5 mm polyp in the transverse colon, removed  with a cold snare. Resected and retrieved.                           - One 3 mm polyp in the sigmoid colon, removed with                            a cold snare. Resected and retrieved.                           - Diverticulosis in the sigmoid colon, in the                            transverse colon and in the ascending colon.                           - The examination was otherwise normal on direct                            and retroflexion views. Recommendation:           - Patient has a contact number available for                            emergencies. The signs and symptoms of potential                            delayed complications were discussed with the                            patient. Return to normal activities tomorrow.                            Written discharge instructions were provided to the                            patient.                           - Resume previous diet.                           - Continue present medications.                           - Await pathology results.                           - Repeat colonoscopy is recommended for                            surveillance. The colonoscopy date will be                            determined after pathology results from today's  exam become available for review.                           - No ibuprofen, naproxen, or other non-steroidal                            anti-inflammatory drugs for 2 weeks after polyp                             removal. Remo Lipps P. Nayelly Laughman MD, MD 12/21/2017 1:35:11 PM This report has been signed electronically.

## 2017-12-24 ENCOUNTER — Telehealth: Payer: Self-pay | Admitting: *Deleted

## 2017-12-24 NOTE — Telephone Encounter (Signed)
  Follow up Call-  Call back number 12/21/2017  Post procedure Call Back phone  # [phone]  Permission to leave phone message Yes  Some recent data might be hidden     Patient questions:  Do you have a fever, pain , or abdominal swelling? No. Pain Score  0 *  Have you tolerated food without any problems? Yes.    Have you been able to return to your normal activities? Yes.    Do you have any questions about your discharge instructions: Diet   No. Medications  No. Follow up visit  No.  Do you have questions or concerns about your Care? No.  Actions: * If pain score is 4 or above: No action needed, pain <4.

## 2018-01-01 ENCOUNTER — Encounter: Payer: Self-pay | Admitting: Family Medicine

## 2018-01-01 ENCOUNTER — Encounter: Payer: Self-pay | Admitting: Gastroenterology

## 2018-01-01 DIAGNOSIS — K635 Polyp of colon: Secondary | ICD-10-CM

## 2018-01-01 DIAGNOSIS — K5730 Diverticulosis of large intestine without perforation or abscess without bleeding: Secondary | ICD-10-CM

## 2018-01-01 HISTORY — DX: Polyp of colon: K63.5

## 2018-01-01 HISTORY — DX: Diverticulosis of large intestine without perforation or abscess without bleeding: K57.30

## 2018-03-09 ENCOUNTER — Other Ambulatory Visit: Payer: Self-pay | Admitting: Family Medicine

## 2018-03-11 ENCOUNTER — Telehealth: Payer: Self-pay | Admitting: Family Medicine

## 2018-03-11 NOTE — Telephone Encounter (Signed)
Pt returned call to [place] and requested that [place] call her back

## 2018-03-13 ENCOUNTER — Ambulatory Visit: Payer: BLUE CROSS/BLUE SHIELD | Admitting: Family Medicine

## 2018-03-13 ENCOUNTER — Encounter: Payer: Self-pay | Admitting: Family Medicine

## 2018-03-13 VITALS — BP 120/80 | HR 64 | Ht 66.75 in | Wt 236.4 lb

## 2018-03-13 DIAGNOSIS — E669 Obesity, unspecified: Secondary | ICD-10-CM

## 2018-03-13 DIAGNOSIS — I10 Essential (primary) hypertension: Secondary | ICD-10-CM | POA: Diagnosis not present

## 2018-03-13 DIAGNOSIS — R609 Edema, unspecified: Secondary | ICD-10-CM

## 2018-03-13 DIAGNOSIS — Z79899 Other long term (current) drug therapy: Secondary | ICD-10-CM

## 2018-03-13 LAB — CBC WITH DIFFERENTIAL/PLATELET
BASOS ABS: 0 10*3/uL (ref 0.0–0.2)
Basos: 0 %
EOS (ABSOLUTE): 0.2 10*3/uL (ref 0.0–0.4)
EOS: 2 %
HEMATOCRIT: 41.5 % (ref 34.0–46.6)
HEMOGLOBIN: 13.8 g/dL (ref 11.1–15.9)
IMMATURE GRANS (ABS): 0 10*3/uL (ref 0.0–0.1)
IMMATURE GRANULOCYTES: 0 %
Lymphocytes Absolute: 2.6 10*3/uL (ref 0.7–3.1)
Lymphs: 32 %
MCH: 26.2 pg — ABNORMAL LOW (ref 26.6–33.0)
MCHC: 33.3 g/dL (ref 31.5–35.7)
MCV: 79 fL (ref 79–97)
MONOCYTES: 5 %
Monocytes Absolute: 0.4 10*3/uL (ref 0.1–0.9)
NEUTROS PCT: 61 %
Neutrophils Absolute: 4.9 10*3/uL (ref 1.4–7.0)
Platelets: 290 10*3/uL (ref 150–450)
RBC: 5.27 x10E6/uL (ref 3.77–5.28)
RDW: 15.6 % — ABNORMAL HIGH (ref 12.3–15.4)
WBC: 8.2 10*3/uL (ref 3.4–10.8)

## 2018-03-13 LAB — COMPREHENSIVE METABOLIC PANEL
ALK PHOS: 80 IU/L (ref 39–117)
ALT: 13 IU/L (ref 0–32)
AST: 16 IU/L (ref 0–40)
Albumin/Globulin Ratio: 1.4 (ref 1.2–2.2)
Albumin: 3.9 g/dL (ref 3.5–5.5)
BUN/Creatinine Ratio: 13 (ref 9–23)
BUN: 14 mg/dL (ref 6–24)
Bilirubin Total: 0.6 mg/dL (ref 0.0–1.2)
CO2: 21 mmol/L (ref 20–29)
CREATININE: 1.04 mg/dL — AB (ref 0.57–1.00)
Calcium: 9.1 mg/dL (ref 8.7–10.2)
Chloride: 108 mmol/L — ABNORMAL HIGH (ref 96–106)
GFR calc Af Amer: 71 mL/min/{1.73_m2} (ref >59)
GFR calc non Af Amer: 61 mL/min/{1.73_m2} (ref >59)
GLUCOSE: 110 mg/dL — AB (ref 65–99)
Globulin, Total: 2.7 g/dL (ref 1.5–4.5)
Potassium: 4.4 mmol/L (ref 3.5–5.2)
SODIUM: 141 mmol/L (ref 134–144)
Total Protein: 6.6 g/dL (ref 6.0–8.5)

## 2018-03-13 MED ORDER — LOSARTAN POTASSIUM 100 MG PO TABS: ORAL_TABLET | ORAL | 4 refills | Status: DC

## 2018-03-13 MED ORDER — AMLODIPINE BESYLATE 10 MG PO TABS: ORAL_TABLET | ORAL | 4 refills | Status: DC

## 2018-03-13 NOTE — Patient Instructions (Signed)
Your BP is normal today at 120/80. Check your BP at home every now and then to make sure it is staying under good control.  Continue on your current medication and if we can cut back on the amlodipine in the future we will.   Eat a low sodium diet (less than 2,400 mg daily) and increase your physical activity.  Get up every hour while at work.  You can try some compression stockings as well.   We will call you with lab results.     Edema Edema is when you have too much fluid in your body or under your skin. Edema may make your legs, feet, and ankles swell up. Swelling is also common in looser tissues, like around your eyes. This is a common condition. It gets more common as you get older. There are many possible causes of edema. Eating too much salt (sodium) and being on your feet or sitting for a long time can cause edema in your legs, feet, and ankles. Hot weather may make edema worse. Edema is usually painless. Your skin may look swollen or shiny. Follow these instructions at home:  Keep the swollen body part raised (elevated) above the level of your heart when you are sitting or lying down.  Do not sit still or stand for a long time.  Do not wear tight clothes. Do not wear garters on your upper legs.  Exercise your legs. This can help the swelling go down.  Wear elastic bandages or support stockings as told by your doctor.  Eat a low-salt (low-sodium) diet to reduce fluid as told by your doctor.  Depending on the cause of your swelling, you may need to limit how much fluid you drink (fluid restriction).  Take over-the-counter and prescription medicines only as told by your doctor. Contact a doctor if:  Treatment is not working.  You have heart, liver, or kidney disease and have symptoms of edema.  You have sudden and unexplained weight gain. Get help right away if:  You have shortness of breath or chest pain.  You cannot breathe when you lie down.  You have pain,  redness, or warmth in the swollen areas.  You have heart, liver, or kidney disease and get edema all of a sudden.  You have a fever and your symptoms get worse all of a sudden. Summary  Edema is when you have too much fluid in your body or under your skin.  Edema may make your legs, feet, and ankles swell up. Swelling is also common in looser tissues, like around your eyes.  Raise (elevate) the swollen body part above the level of your heart when you are sitting or lying down.  Follow your doctor's instructions about diet and how much fluid you can drink (fluid restriction). This information is not intended to replace advice given to you by your health care provider. Make sure you discuss any questions you have with your health care provider. Document Released: 03/13/2008 Document Revised: 10/13/2016 Document Reviewed: 10/13/2016 Elsevier Interactive Patient Education  2017 [employer].

## 2018-03-13 NOTE — Progress Notes (Signed)
   Subjective:    Patient ID: Mia Davis, female    DOB: [birth year], 52 y.o.   MRN: [id number]  HPI Chief Complaint  Patient presents with  . med check    med check and follow-up on bp   She is here to follow up on HTN.  She does not check her blood pressure at home. Reports doing fine on her current medications and reports good compliance. States she is eating less salt, eating more salads.  She reports increasing her walking more.  Complains of intermittent peripheral edema bilaterally.  This is been going on for approximately 3 weeks. States she sits all day at work.  Edema resolves by morning time after elevating her legs.  Denies fever, chills, dizziness, chest pain, palpitations, shortness of breath, orthopnea, abdominal pain, N/V/D.   States her mother was recently diagnosed with stage IV CKD and she is curious how to prevent this.  Reviewed allergies, medications, past medical, surgical, family, and social history.    Review of Systems Pertinent positives and negatives in the history of present illness.     Objective:   Physical Exam BP 120/80   Pulse 64   Ht 5' 6.75" (1.695 m)   Wt 236 lb 6.4 oz (107.2 kg)   LMP 07/13/2016   BMI 37.30 kg/m   Alert and oriented and in no acute distress.  Extremities without edema, intact distal pulses.  Skin is warm and dry, no pallor or rash.      Assessment & Plan:  Essential hypertension - Plan: CBC with Differential/Platelet, Comprehensive metabolic panel, losartan (COZAAR) 100 MG tablet, amLODipine (NORVASC) 10 MG tablet  Peripheral edema - Plan: CBC with Differential/Platelet, Comprehensive metabolic panel  Medication management  Obesity (BMI 30-39.9)  Blood pressure is in goal range.  We will continue on current medication regimen. Encouraged a low-sodium diet and increasing her physical activity. Counseling done a weight loss to help control hypertension and other chronic health conditions. Discussed  peripheral edema that is intermittent may be due to sitting all day, too much sodium in her diet, obesity and possibly amlodipine.  Discussed that if her blood pressure continues being well managed that we could try cutting her amlodipine back from 10 mg to 5 mg.  She will need to keep an eye on her blood pressure at home. She may try compression stockings while sitting at work during the day.  I also encouraged her to get up every hour. She had some questions regarding kidney disease since her mother was recently diagnosed with stage IV renal disease.  Counseling was done that in order to help prevent CKD, she should keep her blood pressure under good control. We will check labs and follow-up.

## 2018-04-23 DIAGNOSIS — H1131 Conjunctival hemorrhage, right eye: Secondary | ICD-10-CM | POA: Diagnosis not present

## 2018-07-04 DIAGNOSIS — Z23 Encounter for immunization: Secondary | ICD-10-CM | POA: Diagnosis not present

## 2018-08-21 DIAGNOSIS — E669 Obesity, unspecified: Secondary | ICD-10-CM | POA: Insufficient documentation

## 2018-08-21 DIAGNOSIS — Z6841 Body Mass Index (BMI) 40.0 and over, adult: Secondary | ICD-10-CM | POA: Insufficient documentation

## 2018-08-21 DIAGNOSIS — Z6837 Body mass index (BMI) 37.0-37.9, adult: Secondary | ICD-10-CM | POA: Insufficient documentation

## 2018-08-21 NOTE — Progress Notes (Signed)
Subjective:    Patient ID: Mia Davis, female    DOB: 03-22-1966, 52 y.o.   MRN: [id number]  HPI Chief Complaint  Patient presents with  . fasting cpe    fasting cpe, no concerns. does not see obgyn since hysterectomy.    She is here for a complete physical exam. Denies any concerns or complaints today. She does want to lose weight and is considering starting on keto diet. Want to know what I think about this.   Other providers: [facility]   Social history: Lives alone, works in [employer] at [employer] in [place] Denies smoking, drinking alcohol, drug use  Diet: High in sugar and carbohydrates.  Often skips breakfast Excerise: nothing lately   Immunizations: flu UTD, Tdap   Health maintenance:  Mammogram: 09/2017 Colonoscopy: at age 62 and this past March. Recall in 3 years.  Last Menstrual cycle: hysterectomy in 2017  Last Dental Exam: twice annually  Last Eye Exam: appointment today   Wears seatbelt always, smoke detectors in home and functioning, does not text while driving and feels safe in home environment.   Reviewed allergies, medications, past medical, surgical, family, and social history.   Review of Systems Review of Systems Constitutional: -fever, -chills, -sweats, -unexpected weight change,-fatigue ENT: -runny nose, -ear pain, -sore throat Cardiology:  -chest pain, -palpitations, -edema Respiratory: -cough, -shortness of breath, -wheezing Gastroenterology: -abdominal pain, -nausea, -vomiting, -diarrhea, -constipation  Hematology: -bleeding or bruising problems Musculoskeletal: -arthralgias, -myalgias, -joint swelling, -back pain Ophthalmology: -vision changes Urology: -dysuria, -difficulty urinating, -hematuria, -urinary frequency, -urgency Neurology: -headache, -weakness, -tingling, -numbness       Objective:   Physical Exam BP 124/82   Pulse 62   Ht 5\' 7"  (1.702 m)   Wt 236 lb (107 kg)   LMP 07/13/2016   BMI 36.96 kg/m   General Appearance:     Alert, cooperative, no distress, appears stated age  Head:    Normocephalic, without obvious abnormality, atraumatic  Eyes:    PERRL, conjunctiva/corneas clear, EOM's intact, fundi    benign  Ears:    Normal TM's and external ear canals  Nose:   Nares normal, mucosa normal, no drainage or sinus   tenderness  Throat:   Lips, mucosa, and tongue normal; teeth and gums normal  Neck:   Supple, no lymphadenopathy;  thyroid:  no   enlargement/tenderness/nodules; no carotid   bruit or JVD  Back:    Spine nontender, no curvature, ROM normal, no CVA     tenderness  Lungs:     Clear to auscultation bilaterally without wheezes, rales or     ronchi; respirations unlabored  Chest Wall:    No tenderness or deformity   Heart:    Regular rate and rhythm, S1 and S2 normal, no murmur, rub   or gallop  Breast Exam:    Declines. Mammogram UTD  Abdomen:     Soft, non-tender, nondistended, normoactive bowel sounds,    no masses, no hepatosplenomegaly  Genitalia:    Declines. Hysterectomy      Extremities:   No clubbing, cyanosis or edema  Pulses:   2+ and symmetric all extremities  Skin:   Skin color, texture, turgor normal, no rashes or lesions  Lymph nodes:   Cervical, supraclavicular, and axillary nodes normal  Neurologic:   CNII-XII intact, normal strength, sensation and gait; reflexes 2+ and symmetric throughout          Psych:   Normal mood, affect, hygiene and grooming.  Urinalysis dipstick: negative       Assessment & Plan:  Routine general medical examination at a health care facility - Plan: POCT Urinalysis DIP (Proadvantage Device), Comprehensive metabolic panel  Essential hypertension - Plan: Comprehensive metabolic panel, amLODipine (NORVASC) 10 MG tablet, losartan (COZAAR) 100 MG tablet  Obesity (BMI 30-39.9) - Plan: Comprehensive metabolic panel, Hemoglobin A1c  Family history of diabetes mellitus in mother - Plan: Hemoglobin A1c  Fasting today.  She is doing well emotionally and  physically. Her blood pressure is in goal range and she will continue on current medication.  No side effects. In-depth counseling done on making healthy lifestyle changes including diet and exercise for weight loss. Discussed cutting back on carbohydrates, portion sizes and trying a free app such as my fitness pal to track her calories.  Encouraged her to avoid diets because these are temporary and instead make healthy lifestyle changes. Screening for diabetes.  She does have a strong family history of diabetes. Immunization counseling done.  She will check and see when her last Tdap was and also check on co-pay for Shingrix vaccine.  She will schedule a nurse visit to get both of these if needed. Follow-up in 6 months for hypertension and weight.

## 2018-08-22 ENCOUNTER — Encounter: Payer: Self-pay | Admitting: Family Medicine

## 2018-08-22 ENCOUNTER — Ambulatory Visit: Payer: BLUE CROSS/BLUE SHIELD | Admitting: Family Medicine

## 2018-08-22 VITALS — BP 124/82 | HR 62 | Ht 67.0 in | Wt 236.0 lb

## 2018-08-22 DIAGNOSIS — E669 Obesity, unspecified: Secondary | ICD-10-CM | POA: Diagnosis not present

## 2018-08-22 DIAGNOSIS — Z0000 Encounter for general adult medical examination without abnormal findings: Secondary | ICD-10-CM

## 2018-08-22 DIAGNOSIS — I10 Essential (primary) hypertension: Secondary | ICD-10-CM

## 2018-08-22 DIAGNOSIS — Z833 Family history of diabetes mellitus: Secondary | ICD-10-CM | POA: Diagnosis not present

## 2018-08-22 LAB — POCT URINALYSIS DIP (PROADVANTAGE DEVICE)
BILIRUBIN UA: NEGATIVE mg/dL
Bilirubin, UA: NEGATIVE
Blood, UA: NEGATIVE
Glucose, UA: NEGATIVE mg/dL
Leukocytes, UA: NEGATIVE
Nitrite, UA: NEGATIVE
PH UA: 6 (ref 5.0–8.0)
PROTEIN UA: NEGATIVE mg/dL
Specific Gravity, Urine: 1.02
UUROB: NEGATIVE

## 2018-08-22 LAB — HEMOGLOBIN A1C
Est. average glucose Bld gHb Est-mCnc: 111 mg/dL
HEMOGLOBIN A1C: 5.5 % (ref 4.8–5.6)

## 2018-08-22 LAB — COMPREHENSIVE METABOLIC PANEL
A/G RATIO: 1.5 (ref 1.2–2.2)
ALK PHOS: 88 IU/L (ref 39–117)
ALT: 13 IU/L (ref 0–32)
AST: 17 IU/L (ref 0–40)
Albumin: 4.1 g/dL (ref 3.5–5.5)
BUN/Creatinine Ratio: 11 (ref 9–23)
BUN: 12 mg/dL (ref 6–24)
Bilirubin Total: 0.4 mg/dL (ref 0.0–1.2)
CHLORIDE: 107 mmol/L — AB (ref 96–106)
CO2: 20 mmol/L (ref 20–29)
Calcium: 8.8 mg/dL (ref 8.7–10.2)
Creatinine, Ser: 1.07 mg/dL — ABNORMAL HIGH (ref 0.57–1.00)
GFR calc Af Amer: 68 mL/min/{1.73_m2} (ref >59)
GFR calc non Af Amer: 59 mL/min/{1.73_m2} — ABNORMAL LOW (ref >59)
GLUCOSE: 102 mg/dL — AB (ref 65–99)
Globulin, Total: 2.7 g/dL (ref 1.5–4.5)
POTASSIUM: 4.2 mmol/L (ref 3.5–5.2)
Sodium: 141 mmol/L (ref 134–144)
Total Protein: 6.8 g/dL (ref 6.0–8.5)

## 2018-08-22 MED ORDER — LOSARTAN POTASSIUM 100 MG PO TABS: ORAL_TABLET | ORAL | 4 refills | Status: DC

## 2018-08-22 MED ORDER — AMLODIPINE BESYLATE 10 MG PO TABS: ORAL_TABLET | ORAL | 4 refills | Status: DC

## 2018-08-22 NOTE — Patient Instructions (Signed)
Check and see when your last Tdap or tetanus shot was.  If it is been 10 or more years you can call and schedule a nurse visit to get this in our office. Call your insurance company to find out your co-pay for the Shingrix vaccine.  You can schedule a nurse visit to get this as well.  Try using a free app such as my fitness pal to track your calories. Make sure you are getting up to 150 minutes of physical activity beyond your regular daily activities.  Sleep  Set a weight loss goal of 10 pounds.  Once you achieve this then you can set another goal.  We will call you with your lab results.    Preventative Care for Adults - Female      MAINTAIN REGULAR HEALTH EXAMS:  A routine yearly physical is a good way to check in with your primary care provider about your health and preventive screening. It is also an opportunity to share updates about your health and any concerns you have, and receive a thorough all-over exam.   Most health insurance companies pay for at least some preventative services.  Check with your health plan for specific coverages.  WHAT PREVENTATIVE SERVICES DO WOMEN NEED?  Adult women should have their weight and blood pressure checked regularly.   Women age 77 and older should have their cholesterol levels checked regularly.  Women should be screened for cervical cancer with a Pap smear and pelvic exam beginning at either age 71, or 3 years after they become sexually activity.    Breast cancer screening generally begins at age 45 with a mammogram and breast exam by your primary care provider.    Beginning at age 61 and continuing to age 81, women should be screened for colorectal cancer.  Certain people may need continued testing until age 33.  Updating vaccinations is part of preventative care.  Vaccinations help protect against diseases such as the flu.  Osteoporosis is a disease in which the bones lose minerals and strength as we age. Women ages 80 and over should  discuss this with their caregivers, as should women after menopause who have other risk factors.  Lab tests are generally done as part of preventative care to screen for anemia and blood disorders, to screen for problems with the kidneys and liver, to screen for bladder problems, to check blood sugar, and to check your cholesterol level.  Preventative services generally include counseling about diet, exercise, avoiding tobacco, drugs, excessive alcohol consumption, and sexually transmitted infections.    GENERAL RECOMMENDATIONS FOR GOOD HEALTH:  Healthy diet:  Eat a variety of foods, including fruit, vegetables, animal or vegetable protein, such as meat, fish, chicken, and eggs, or beans, lentils, tofu, and grains, such as rice.  Drink plenty of water daily.  Decrease saturated fat in the diet, avoid lots of red meat, processed foods, sweets, fast foods, and fried foods.  Exercise:  Aerobic exercise helps maintain good heart health. At least 30-40 minutes of moderate-intensity exercise is recommended. For example, a brisk walk that increases your heart rate and breathing. This should be done on most days of the week.   Find a type of exercise or a variety of exercises that you enjoy so that it becomes a part of your daily life.  Examples are running, walking, swimming, water aerobics, and biking.  For motivation and support, explore group exercise such as aerobic class, spin class, Zumba, Yoga,or  martial arts, etc.  Set exercise goals for yourself, such as a certain weight goal, walk or run in a race such as a 5k walk/run.  Speak to your primary care provider about exercise goals.  Disease prevention:  If you smoke or chew tobacco, find out from your caregiver how to quit. It can literally save your life, no matter how long you have been a tobacco user. If you do not use tobacco, never begin.   Maintain a healthy diet and normal weight. Increased weight leads to problems with blood  pressure and diabetes.   The Body Mass Index or BMI is a way of measuring how much of your body is fat. Having a BMI above 27 increases the risk of heart disease, diabetes, hypertension, stroke and other problems related to obesity. Your caregiver can help determine your BMI and based on it develop an exercise and dietary program to help you achieve or maintain this important measurement at a healthful level.  High blood pressure causes heart and blood vessel problems.  Persistent high blood pressure should be treated with medicine if weight loss and exercise do not work.   Fat and cholesterol leaves deposits in your arteries that can block them. This causes heart disease and vessel disease elsewhere in your body.  If your cholesterol is found to be high, or if you have heart disease or certain other medical conditions, then you may need to have your cholesterol monitored frequently and be treated with medication.   Ask if you should have a cardiac stress test if your history suggests this. A stress test is a test done on a treadmill that looks for heart disease. This test can find disease prior to there being a problem.  Menopause can be associated with physical symptoms and risks. Hormone replacement therapy is available to decrease these. You should talk to your caregiver about whether starting or continuing to take hormones is right for you.   Osteoporosis is a disease in which the bones lose minerals and strength as we age. This can result in serious bone fractures. Risk of osteoporosis can be identified using a bone density scan. Women ages 48 and over should discuss this with their caregivers, as should women after menopause who have other risk factors. Ask your caregiver whether you should be taking a calcium supplement and Vitamin D, to reduce the rate of osteoporosis.   Avoid drinking alcohol in excess (more than two drinks per day).  Avoid use of street drugs. Do not share needles with  anyone. Ask for professional help if you need assistance or instructions on stopping the use of alcohol, cigarettes, and/or drugs.  Brush your teeth twice a day with fluoride toothpaste, and floss once a day. Good oral hygiene prevents tooth decay and gum disease. The problems can be painful, unattractive, and can cause other health problems. Visit your dentist for a routine oral and dental check up and preventive care every 6-12 months.   Look at your skin regularly.  Use a mirror to look at your back. Notify your caregivers of changes in moles, especially if there are changes in shapes, colors, a size larger than a pencil eraser, an irregular border, or development of new moles.  Safety:  Use seatbelts 100% of the time, whether driving or as a passenger.  Use safety devices such as hearing protection if you work in environments with loud noise or significant background noise.  Use safety glasses when doing any work that could send debris in to the  eyes.  Use a helmet if you ride a bike or motorcycle.  Use appropriate safety gear for contact sports.  Talk to your caregiver about gun safety.  Use sunscreen with a SPF (or skin protection factor) of 15 or greater.  Lighter skinned people are at a greater risk of skin cancer. Don't forget to also wear sunglasses in order to protect your eyes from too much damaging sunlight. Damaging sunlight can accelerate cataract formation.   Practice safe sex. Use condoms. Condoms are used for birth control and to help reduce the spread of sexually transmitted infections (or STIs).  Some of the STIs are gonorrhea (the clap), chlamydia, syphilis, trichomonas, herpes, HPV (human papilloma virus) and HIV (human immunodeficiency virus) which causes AIDS. The herpes, HIV and HPV are viral illnesses that have no cure. These can result in disability, cancer and death.   Keep carbon monoxide and smoke detectors in your home functioning at all times. Change the batteries every  6 months or use a model that plugs into the wall.   Vaccinations:  Stay up to date with your tetanus shots and other required immunizations. You should have a booster for tetanus every 10 years. Be sure to get your flu shot every year, since 5%-20% of the [place] population comes down with the flu. The flu vaccine changes each year, so being vaccinated once is not enough. Get your shot in the fall, before the flu season peaks.   Other vaccines to consider:  Human Papilloma Virus or HPV causes cancer of the cervix, and other infections that can be transmitted from person to person. There is a vaccine for HPV, and females should get immunized between the ages of 4 and 24. It requires a series of 3 shots.   Pneumococcal vaccine to protect against certain types of pneumonia.  This is normally recommended for adults age 48 or older.  However, adults younger than 54 years old with certain underlying conditions such as diabetes, heart or lung disease should also receive the vaccine.  Shingles vaccine to protect against Varicella Zoster if you are older than age 2, or younger than 54 years old with certain underlying illness.  Hepatitis A vaccine to protect against a form of infection of the liver by a virus acquired from food.  Hepatitis B vaccine to protect against a form of infection of the liver by a virus acquired from blood or body fluids, particularly if you work in health care.  If you plan to travel internationally, check with your local health department for specific vaccination recommendations.  Cancer Screening:  Breast cancer screening is essential to preventive care for women. All women age 30 and older should perform a breast self-exam every month. At age 17 and older, women should have their caregiver complete a breast exam each year. Women at ages 53 and older should have a mammogram (x-ray film) of the breasts. Your caregiver can discuss how often you need mammograms.    Cervical  cancer screening includes taking a Pap smear (sample of cells examined under a microscope) from the cervix (end of the uterus). It also includes testing for HPV (Human Papilloma Virus, which can cause cervical cancer). Screening and a pelvic exam should begin at age 26, or 3 years after a woman becomes sexually active. Screening should occur every year, with a Pap smear but no HPV testing, up to age 104. After age 21, you should have a Pap smear every 3 years with HPV testing, if no HPV  was found previously.   Most routine colon cancer screening begins at the age of 61. On a yearly basis, doctors may provide special easy to use take-home tests to check for hidden blood in the stool. Sigmoidoscopy or colonoscopy can detect the earliest forms of colon cancer and is life saving. These tests use a small camera at the end of a tube to directly examine the colon. Speak to your caregiver about this at age 39, when routine screening begins (and is repeated every 5 years unless early forms of pre-cancerous polyps or small growths are found).

## 2018-09-25 DIAGNOSIS — Z803 Family history of malignant neoplasm of breast: Secondary | ICD-10-CM | POA: Diagnosis not present

## 2018-09-25 DIAGNOSIS — Z1231 Encounter for screening mammogram for malignant neoplasm of breast: Secondary | ICD-10-CM | POA: Diagnosis not present

## 2018-09-25 LAB — HM MAMMOGRAPHY

## 2018-10-04 ENCOUNTER — Encounter: Payer: Self-pay | Admitting: Internal Medicine

## 2019-02-14 ENCOUNTER — Encounter: Payer: Self-pay | Admitting: Family Medicine

## 2019-02-21 ENCOUNTER — Ambulatory Visit: Payer: BLUE CROSS/BLUE SHIELD | Admitting: Family Medicine

## 2019-02-21 ENCOUNTER — Other Ambulatory Visit: Payer: Self-pay

## 2019-02-21 ENCOUNTER — Encounter: Payer: Self-pay | Admitting: Family Medicine

## 2019-02-21 VITALS — BP 138/92 | HR 63 | Temp 97.7°F | Ht 66.25 in | Wt 240.8 lb

## 2019-02-21 DIAGNOSIS — L816 Other disorders of diminished melanin formation: Secondary | ICD-10-CM

## 2019-02-21 DIAGNOSIS — I10 Essential (primary) hypertension: Secondary | ICD-10-CM

## 2019-02-21 DIAGNOSIS — L819 Disorder of pigmentation, unspecified: Secondary | ICD-10-CM | POA: Diagnosis not present

## 2019-02-21 DIAGNOSIS — E669 Obesity, unspecified: Secondary | ICD-10-CM

## 2019-02-21 NOTE — Progress Notes (Signed)
Subjective:    Patient ID: Mia Davis, female    DOB: [birth year], 53 y.o.   MRN: [id number]  HPI Chief Complaint  Patient presents with  . follow-up on HTN and weight    follow-up on HTN and weight   Here to follow up on HTN and weight.  Also has a skin concern between her buttocks. States she does not check her blood pressure at home unless she has a headache. Admits that she is not taking medications on a regular basis.  States she forgets to take them.   She is aware that she is supposed to be taking amlodipine 10 mg and losartan 100 mg daily.   States her diet has been poor for the past 2 months and is eating foods high in sodium.  She is not exercising. States she used my fitness pal and lost a few pounds since she was last here however over the past 2 months she reports gaining those pounds back in addition to 4 more pounds. States she is working from home and eating more often than usual.  Complains of a area between her buttocks that has lost pigmentation and is occasionally pruritic.  This is been present since February per patient.  States she has not used anything on the area.  She has changed soaps and is now using sensitive Dove bar soap.  States it is not been as pruritic more recently.  Denies tenderness.  Denies fever, chills, dizziness, chest pain, palpitations, shortness of breath, abdominal pain, N/V/D, urinary symptoms, LE edema.   Reviewed allergies, medications, past medical, surgical, family, and social history.    Review of Systems Pertinent positives and negatives in the history of present illness.     Objective:   Physical Exam BP (!) 138/92   Pulse 63   Temp 97.7 F (36.5 C) (Oral)   Ht 5' 6.25" (1.683 m)   Wt 240 lb 12.8 oz (109.2 kg)   LMP 07/13/2016   BMI 38.57 kg/m   Alert and oriented and in no acute distress.  Extremities without edema.  Area of hypopigmentation between buttocks without erythema, induration or drainage.        Assessment & Plan:  Essential hypertension  Obesity (BMI 30-39.9)  Skin hypopigmentation  Discussed that her blood pressure is not well controlled and this is due to the fact that she is inconsistent taking her blood pressure medications and admittedly eating and poor diet high in sodium.  Discussed that we are not able to cure hypertension, only able to manage it.  Discussed the need to take her blood pressure medications daily.  She has not been checking her blood pressure at home except when she has a "headache".  Discussed that there are no symptoms of elevated blood pressure and encouraged her to start checking this on a more regular basis. Encouraged her to make healthier food choices and to cut back on portion sizes. Area of hypopigmentation -no sign of infection or inflammation. She will try hydrocortisone to see if this helps with the itching and advised her to not use this more than 2 weeks at a time.  If she notices worsening symptoms then she will try Lamisil.  Discussed keeping area as dry as possible and putting white cotton between the buttocks to help cut back on moisture and friction.  If this worsens she will let me know.  We may need to refer to dermatology. Declines labs. States she will come back 20 lbs lighter  and healthier at her follow up. States she is motivated to take better care of herself.  She will follow up 6 months for fasting CPE or sooner if her BP is not controlled.

## 2019-02-21 NOTE — Patient Instructions (Signed)
Your blood pressure today is 138/92 and your hypertension is not well controlled.  I recommend that you take your medications daily and check your blood pressure regularly at home.  Goal blood pressure is less than 130/80.  Pay closer attention to your diet and cut back on sodium intake.  Keep the skin as dry as possible.  If you have itching you can try an over-the-counter hydrocortisone steroid cream.  Do not use this for more than 2 weeks at a time.  If this is making your symptoms worse, stop using the steroid and try using over-the-counter Lamisil.  Try using white cotton to keep the layers of skin from touching.  Follow-up in late November or early December for a fasting physical.  If your blood pressures are not improving when you take your medication daily and watch her diet then come back in.   Managing Your Hypertension Hypertension is commonly called high blood pressure. This is when the force of your blood pressing against the walls of your arteries is too strong. Arteries are blood vessels that carry blood from your heart throughout your body. Hypertension forces the heart to work harder to pump blood, and may cause the arteries to become narrow or stiff. Having untreated or uncontrolled hypertension can cause heart attack, stroke, kidney disease, and other problems. What are blood pressure readings? A blood pressure reading consists of a higher number over a lower number. Ideally, your blood pressure should be below 120/80. The first ("top") number is called the systolic pressure. It is a measure of the pressure in your arteries as your heart beats. The second ("bottom") number is called the diastolic pressure. It is a measure of the pressure in your arteries as the heart relaxes. What does my blood pressure reading mean? Blood pressure is classified into four stages. Based on your blood pressure reading, your health care provider may use the following stages to determine what type of  treatment you need, if any. Systolic pressure and diastolic pressure are measured in a unit called mm Hg. Normal  Systolic pressure: below 102.  Diastolic pressure: below 80. Elevated  Systolic pressure: 585-277.  Diastolic pressure: below 80. Hypertension stage 1  Systolic pressure: 824-235.  Diastolic pressure: 36-14. Hypertension stage 2  Systolic pressure: 431 or above.  Diastolic pressure: 90 or above. What health risks are associated with hypertension? Managing your hypertension is an important responsibility. Uncontrolled hypertension can lead to:  A heart attack.  A stroke.  A weakened blood vessel (aneurysm).  Heart failure.  Kidney damage.  Eye damage.  Metabolic syndrome.  Memory and concentration problems. What changes can I make to manage my hypertension? Hypertension can be managed by making lifestyle changes and possibly by taking medicines. Your health care provider will help you make a plan to bring your blood pressure within a normal range. Eating and drinking   Eat a diet that is high in fiber and potassium, and low in salt (sodium), added sugar, and fat. An example eating plan is called the DASH (Dietary Approaches to Stop Hypertension) diet. To eat this way: ? Eat plenty of fresh fruits and vegetables. Try to fill half of your plate at each meal with fruits and vegetables. ? Eat whole grains, such as whole wheat pasta, brown rice, or whole grain bread. Fill about one quarter of your plate with whole grains. ? Eat low-fat diary products. ? Avoid fatty cuts of meat, processed or cured meats, and poultry with skin. Fill about one  quarter of your plate with lean proteins such as fish, chicken without skin, beans, eggs, and tofu. ? Avoid premade and processed foods. These tend to be higher in sodium, added sugar, and fat.  Reduce your daily sodium intake. Most people with hypertension should eat less than 1,500 mg of sodium a day.  Limit alcohol  intake to no more than 1 drink a day for nonpregnant women and 2 drinks a day for men. One drink equals 12 oz of beer, 5 oz of wine, or 1 oz of hard liquor. Lifestyle  Work with your health care provider to maintain a healthy body weight, or to lose weight. Ask what an ideal weight is for you.  Get at least 30 minutes of exercise that causes your heart to beat faster (aerobic exercise) most days of the week. Activities may include walking, swimming, or biking.  Include exercise to strengthen your muscles (resistance exercise), such as weight lifting, as part of your weekly exercise routine. Try to do these types of exercises for 30 minutes at least 3 days a week.  Do not use any products that contain nicotine or tobacco, such as cigarettes and e-cigarettes. If you need help quitting, ask your health care provider.  Control any long-term (chronic) conditions you have, such as high cholesterol or diabetes. Monitoring  Monitor your blood pressure at home as told by your health care provider. Your personal target blood pressure may vary depending on your medical conditions, your age, and other factors.  Have your blood pressure checked regularly, as often as told by your health care provider. Working with your health care provider  Review all the medicines you take with your health care provider because there may be side effects or interactions.  Talk with your health care provider about your diet, exercise habits, and other lifestyle factors that may be contributing to hypertension.  Visit your health care provider regularly. Your health care provider can help you create and adjust your plan for managing hypertension. Will I need medicine to control my blood pressure? Your health care provider may prescribe medicine if lifestyle changes are not enough to get your blood pressure under control, and if:  Your systolic blood pressure is 130 or higher.  Your diastolic blood pressure is 80 or  higher. Take medicines only as told by your health care provider. Follow the directions carefully. Blood pressure medicines must be taken as prescribed. The medicine does not work as well when you skip doses. Skipping doses also puts you at risk for problems. Contact a health care provider if:  You think you are having a reaction to medicines you have taken.  You have repeated (recurrent) headaches.  You feel dizzy.  You have swelling in your ankles.  You have trouble with your vision. Get help right away if:  You develop a severe headache or confusion.  You have unusual weakness or numbness, or you feel faint.  You have severe pain in your chest or abdomen.  You vomit repeatedly.  You have trouble breathing. Summary  Hypertension is when the force of blood pumping through your arteries is too strong. If this condition is not controlled, it may put you at risk for serious complications.  Your personal target blood pressure may vary depending on your medical conditions, your age, and other factors. For most people, a normal blood pressure is less than 120/80.  Hypertension is managed by lifestyle changes, medicines, or both. Lifestyle changes include weight loss, eating a healthy, low-sodium  diet, exercising more, and limiting alcohol. This information is not intended to replace advice given to you by your health care provider. Make sure you discuss any questions you have with your health care provider. Document Released: 06/19/2012 Document Revised: 08/23/2016 Document Reviewed: 08/23/2016 Elsevier Interactive Patient Education  2019 [employer].

## 2019-03-04 ENCOUNTER — Encounter: Payer: Self-pay | Admitting: Family Medicine

## 2019-03-04 ENCOUNTER — Other Ambulatory Visit: Payer: Self-pay | Admitting: Family Medicine

## 2019-03-04 DIAGNOSIS — I10 Essential (primary) hypertension: Secondary | ICD-10-CM

## 2019-03-04 MED ORDER — AMLODIPINE BESYLATE 10 MG PO TABS: ORAL_TABLET | ORAL | 4 refills | Status: DC

## 2019-03-11 ENCOUNTER — Other Ambulatory Visit: Payer: Self-pay | Admitting: Family Medicine

## 2019-03-11 DIAGNOSIS — I10 Essential (primary) hypertension: Secondary | ICD-10-CM

## 2019-06-03 ENCOUNTER — Other Ambulatory Visit: Payer: Self-pay | Admitting: Family Medicine

## 2019-06-03 DIAGNOSIS — I10 Essential (primary) hypertension: Secondary | ICD-10-CM

## 2019-06-28 ENCOUNTER — Other Ambulatory Visit: Payer: Self-pay | Admitting: Family Medicine

## 2019-06-28 DIAGNOSIS — I10 Essential (primary) hypertension: Secondary | ICD-10-CM

## 2019-07-06 ENCOUNTER — Encounter: Payer: Self-pay | Admitting: Family Medicine

## 2019-07-25 ENCOUNTER — Other Ambulatory Visit: Payer: Self-pay | Admitting: Family Medicine

## 2019-07-25 DIAGNOSIS — I10 Essential (primary) hypertension: Secondary | ICD-10-CM

## 2019-08-13 ENCOUNTER — Telehealth: Payer: Self-pay | Admitting: Internal Medicine

## 2019-08-13 NOTE — Telephone Encounter (Signed)
Left message for pt to call back to schedule cpe 

## 2019-08-21 NOTE — Progress Notes (Signed)
   Subjective:    Patient ID: Mia Davis, female    DOB: [birth year], 53 y.o.   MRN: [id number]  HPI Chief Complaint  Patient presents with  . fastin cpe    fasting cpe, sees obgyn in january   She is here for a complete physical exam however, her last CPE was 08/22/2018 and she is early for her annual. She would like to reschedule in case her insurance bills her for this.

## 2019-08-22 ENCOUNTER — Ambulatory Visit: Payer: BLUE CROSS/BLUE SHIELD | Admitting: Family Medicine

## 2019-08-22 ENCOUNTER — Encounter: Payer: Self-pay | Admitting: Family Medicine

## 2019-08-22 ENCOUNTER — Other Ambulatory Visit: Payer: Self-pay

## 2019-08-22 DIAGNOSIS — Z833 Family history of diabetes mellitus: Secondary | ICD-10-CM | POA: Insufficient documentation

## 2019-08-28 ENCOUNTER — Encounter: Payer: Self-pay | Admitting: Family Medicine

## 2019-08-28 ENCOUNTER — Ambulatory Visit (INDEPENDENT_AMBULATORY_CARE_PROVIDER_SITE_OTHER): Payer: BLUE CROSS/BLUE SHIELD | Admitting: Family Medicine

## 2019-08-28 ENCOUNTER — Other Ambulatory Visit: Payer: Self-pay

## 2019-08-28 ENCOUNTER — Encounter: Payer: Self-pay | Admitting: Internal Medicine

## 2019-08-28 VITALS — BP 120/82 | HR 77 | Temp 97.1°F | Ht 66.5 in | Wt 237.0 lb

## 2019-08-28 DIAGNOSIS — R21 Rash and other nonspecific skin eruption: Secondary | ICD-10-CM | POA: Diagnosis not present

## 2019-08-28 DIAGNOSIS — L819 Disorder of pigmentation, unspecified: Secondary | ICD-10-CM

## 2019-08-28 DIAGNOSIS — Z0000 Encounter for general adult medical examination without abnormal findings: Secondary | ICD-10-CM

## 2019-08-28 DIAGNOSIS — Z23 Encounter for immunization: Secondary | ICD-10-CM

## 2019-08-28 DIAGNOSIS — I10 Essential (primary) hypertension: Secondary | ICD-10-CM | POA: Diagnosis not present

## 2019-08-28 DIAGNOSIS — E669 Obesity, unspecified: Secondary | ICD-10-CM

## 2019-08-28 DIAGNOSIS — Z833 Family history of diabetes mellitus: Secondary | ICD-10-CM | POA: Diagnosis not present

## 2019-08-28 MED ORDER — CLOBETASOL PROPIONATE 0.05 % EX CREA: 1.0000 "application " | TOPICAL_CREAM | Freq: Two times a day (BID) | CUTANEOUS | 0 refills | Status: DC

## 2019-08-28 NOTE — Patient Instructions (Signed)

## 2019-08-28 NOTE — Progress Notes (Signed)
Subjective:    Patient ID: Mia Davis, female    DOB: [birth year], 53 y.o.   MRN: [id number]  HPI Chief Complaint  Patient presents with  . cpe    cpe fasting   She is here for a complete physical exam and follow-up on chronic health conditions.  Other providers: OB/GYN- Dr. Dellis Filbert. Scheduled in January   Complains of itching and skin discoloration of her perineum area since May.  States it has gotten worse over the past couple of months.  She is having more itching.  I did encourage her to use a steroid cream for 2 weeks to see if this helps and she reports that she is only using it occasionally.  She does note that the steroid cream helps calm down the itching.  Hypertension-reports good daily compliance on amlodipine and losartan without any side effects.   Social history: Lives alone, 3 kids, 7 grandchildren. 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 Eats out a lot  Excerise: walking 30 minutes per day   Immunizations: needs Tdap   Health maintenance:  Mammogram: 09/2018  Colonoscopy: March 2019. Had polyps and due for recall in march 2022  Last Gynecological Exam: scheduled in January 2021 Last Menstrual cycle: hysterectomy  Last Dental Exam: October 2020 Last Eye Exam: October 2019   Wears seatbelt always, smoke detectors in home and functioning, does not text while driving and feels safe in home environment.   Reviewed allergies, medications, past medical, surgical, family, and social history.    Review of Systems Review of Systems Constitutional: -fever, -chills, -sweats, -unexpected weight change,-fatigue ENT: -runny nose, -ear pain, -sore throat Cardiology:  -chest pain, -palpitations, -edema Respiratory: -cough, -shortness of breath, -wheezing Gastroenterology: -abdominal pain, -nausea, -vomiting, -diarrhea, -constipation  Hematology: -bleeding or bruising problems  Musculoskeletal: -arthralgias, -myalgias, -joint swelling, -back pain Ophthalmology: -vision changes Urology: -dysuria, -difficulty urinating, -hematuria, -urinary frequency, -urgency Neurology: -headache, -weakness, -tingling, -numbness       Objective:   Physical Exam BP 120/82   Pulse 77   Temp (!) 97.1 F (36.2 C)   Ht 5' 6.5" (1.689 m)   Wt 237 lb (107.5 kg)   LMP 07/13/2016   BMI 37.68 kg/m   General Appearance:    Alert, cooperative, no distress, appears stated age  Head:    Normocephalic, without obvious abnormality, atraumatic  Eyes:    PERRL, conjunctiva/corneas clear, EOM's intact  Ears:    Normal TM's and external ear canals  Nose:  Mask in place  Throat:  Mask in place  Neck:   Supple, no lymphadenopathy;  thyroid:  no   enlargement/tenderness/nodules  Back:    Spine nontender, no curvature, ROM normal, no CVA     tenderness  Lungs:     Clear to auscultation bilaterally without wheezes, rales or     ronchi; respirations unlabored  Chest Wall:    No tenderness or deformity   Heart:    Regular rate and rhythm, S1 and S2 normal, no murmur, rub   or gallop  Breast Exam:   OB/GYN  Abdomen:     Soft, non-tender, nondistended, normoactive bowel sounds,    no masses, no hepatosplenomegaly  Genitalia:   OB/GYN     Extremities:   No clubbing, cyanosis or edema  Pulses:   2+ and symmetric all extremities  Skin:   Skin color, texture, turgor normal.  A large area of hypopigmentation with some  erythema in her perineum.  This rash does not extend into the vulva or to the rectum.  Lymph nodes:   Cervical, supraclavicular, and axillary nodes normal  Neurologic:   CNII-XII intact, normal strength, sensation and gait; reflexes 2+ and symmetric throughout          Psych:   Normal mood, affect, hygiene and grooming.         Assessment & Plan:  Routine general medical examination at a health care facility - Plan: CBC with Differential, Comprehensive metabolic panel, TSH, T4,  Free, Lipid Panel -Here today for a fasting CPE.  Preventive healthcare discussed.  She has an appointment with her OB/GYN.  Colonoscopy up-to-date.  Discussed safety and health promotion.  Counseling on healthy lifestyle including diet and exercise.  Immunizations reviewed.  Essential hypertension -Well-controlled on current medications.  Continue with treatment.  Encourage low-sodium diet.  Obesity (BMI 30-39.9) - Plan: TSH, T4, Free, Hemoglobin A1c, Lipid Panel  Family history of diabetes mellitus in mother - Plan: Hemoglobin A1c -She is a resident and family history increases her risk of developing diabetes and that she can help prevent this with healthy diet and exercise  Need for diphtheria-tetanus-pertussis (Tdap) vaccine - Plan: Tdap vaccine greater than or equal to 7yo IM-counseling done on all components of this vaccine  Rash and nonspecific skin eruption - Plan: clobetasol cream (TEMOVATE) 0.05 %, Ambulatory referral to Dermatology -Discussed that this may be lichen sclerosis and needs to be evaluated by dermatologist.  She may use the clobetasol for the next week until she is seen by the dermatologist.  Skin hypopigmentation - Plan: Ambulatory referral to Dermatology

## 2019-08-29 LAB — COMPREHENSIVE METABOLIC PANEL
ALT: 12 IU/L (ref 0–32)
AST: 18 IU/L (ref 0–40)
Albumin/Globulin Ratio: 1.6 (ref 1.2–2.2)
Albumin: 4.3 g/dL (ref 3.8–4.9)
Alkaline Phosphatase: 100 IU/L (ref 39–117)
BUN/Creatinine Ratio: 11 (ref 9–23)
BUN: 12 mg/dL (ref 6–24)
Bilirubin Total: 1.2 mg/dL (ref 0.0–1.2)
CO2: 19 mmol/L — ABNORMAL LOW (ref 20–29)
Calcium: 9.1 mg/dL (ref 8.7–10.2)
Chloride: 106 mmol/L (ref 96–106)
Creatinine, Ser: 1.11 mg/dL — ABNORMAL HIGH (ref 0.57–1.00)
GFR calc Af Amer: 65 mL/min/{1.73_m2} (ref >59)
GFR calc non Af Amer: 56 mL/min/{1.73_m2} — ABNORMAL LOW (ref >59)
Globulin, Total: 2.7 g/dL (ref 1.5–4.5)
Glucose: 109 mg/dL — ABNORMAL HIGH (ref 65–99)
Potassium: 4 mmol/L (ref 3.5–5.2)
Sodium: 140 mmol/L (ref 134–144)
Total Protein: 7 g/dL (ref 6.0–8.5)

## 2019-08-29 LAB — HEMOGLOBIN A1C
Est. average glucose Bld gHb Est-mCnc: 111 mg/dL
Hgb A1c MFr Bld: 5.5 % (ref 4.8–5.6)

## 2019-08-29 LAB — CBC WITH DIFFERENTIAL/PLATELET
Basophils Absolute: 0 10*3/uL (ref 0.0–0.2)
Basos: 0 %
EOS (ABSOLUTE): 0.2 10*3/uL (ref 0.0–0.4)
Eos: 3 %
Hematocrit: 42.9 % (ref 34.0–46.6)
Hemoglobin: 14.4 g/dL (ref 11.1–15.9)
Immature Grans (Abs): 0 10*3/uL (ref 0.0–0.1)
Immature Granulocytes: 0 %
Lymphocytes Absolute: 2.7 10*3/uL (ref 0.7–3.1)
Lymphs: 40 %
MCH: 26.1 pg — ABNORMAL LOW (ref 26.6–33.0)
MCHC: 33.6 g/dL (ref 31.5–35.7)
MCV: 78 fL — ABNORMAL LOW (ref 79–97)
Monocytes Absolute: 0.5 10*3/uL (ref 0.1–0.9)
Monocytes: 7 %
Neutrophils Absolute: 3.4 10*3/uL (ref 1.4–7.0)
Neutrophils: 50 %
Platelets: 307 10*3/uL (ref 150–450)
RBC: 5.51 x10E6/uL — ABNORMAL HIGH (ref 3.77–5.28)
RDW: 14.4 % (ref 11.7–15.4)
WBC: 6.8 10*3/uL (ref 3.4–10.8)

## 2019-08-29 LAB — LIPID PANEL
Chol/HDL Ratio: 3.8 ratio (ref 0.0–4.4)
Cholesterol, Total: 149 mg/dL (ref 100–199)
HDL: 39 mg/dL — ABNORMAL LOW (ref >39)
LDL Chol Calc (NIH): 95 mg/dL (ref 0–99)
Triglycerides: 78 mg/dL (ref 0–149)
VLDL Cholesterol Cal: 15 mg/dL (ref 5–40)

## 2019-08-29 LAB — T4, FREE: Free T4: 1.26 ng/dL (ref 0.82–1.77)

## 2019-08-29 LAB — TSH: TSH: 1.18 u[IU]/mL (ref 0.450–4.500)

## 2019-09-03 DIAGNOSIS — L900 Lichen sclerosus et atrophicus: Secondary | ICD-10-CM | POA: Diagnosis not present

## 2019-09-17 ENCOUNTER — Other Ambulatory Visit: Payer: Self-pay | Admitting: Family Medicine

## 2019-09-17 DIAGNOSIS — I10 Essential (primary) hypertension: Secondary | ICD-10-CM

## 2019-09-21 ENCOUNTER — Other Ambulatory Visit: Payer: Self-pay | Admitting: Family Medicine

## 2019-09-21 DIAGNOSIS — I10 Essential (primary) hypertension: Secondary | ICD-10-CM

## 2019-09-26 LAB — HM MAMMOGRAPHY

## 2019-10-15 ENCOUNTER — Encounter: Payer: Self-pay | Admitting: Internal Medicine

## 2019-10-27 ENCOUNTER — Other Ambulatory Visit: Payer: Self-pay

## 2019-10-28 ENCOUNTER — Ambulatory Visit: Payer: BLUE CROSS/BLUE SHIELD | Admitting: Obstetrics & Gynecology

## 2019-10-28 ENCOUNTER — Encounter: Payer: Self-pay | Admitting: Obstetrics & Gynecology

## 2019-10-28 VITALS — BP 126/84 | Ht 65.5 in | Wt 238.0 lb

## 2019-10-28 DIAGNOSIS — Z6839 Body mass index (BMI) 39.0-39.9, adult: Secondary | ICD-10-CM

## 2019-10-28 DIAGNOSIS — Z780 Asymptomatic menopausal state: Secondary | ICD-10-CM

## 2019-10-28 DIAGNOSIS — Z90710 Acquired absence of both cervix and uterus: Secondary | ICD-10-CM | POA: Diagnosis not present

## 2019-10-28 DIAGNOSIS — Z1272 Encounter for screening for malignant neoplasm of vagina: Secondary | ICD-10-CM | POA: Diagnosis not present

## 2019-10-28 DIAGNOSIS — L819 Disorder of pigmentation, unspecified: Secondary | ICD-10-CM

## 2019-10-28 DIAGNOSIS — N952 Postmenopausal atrophic vaginitis: Secondary | ICD-10-CM | POA: Diagnosis not present

## 2019-10-28 DIAGNOSIS — Z01419 Encounter for gynecological examination (general) (routine) without abnormal findings: Secondary | ICD-10-CM | POA: Diagnosis not present

## 2019-10-28 NOTE — Progress Notes (Signed)
Mia Davis 11-16-1965 [id number]   History:    54 y.o. G2P2L2 Longterm boyfriend.  Twins in their 47's.  Youngest son died at 54 yo.  RP:  New patient presenting for annual gyn exam   HPI: S/P TAH/Bilateral Salpingectomy in 2017 for Fibroids.  No pelvic pain.  No hot flushes/night sweats.  C/O dryness with IC.  Perianal itching/skin depigmentation x 11/2018.  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  Clobetasol cream prescribed but not used.  Seen by Dermato 08/2019, prescribed Lidocaine cream which she used x 2 weeks.  No itching or discomfort >1 month without any treatment.  Urine/BMs normal.  Breasts normal.  BMI 39.  Not physically active.  Health Labs with Fam MD.  On Losartan for cHTN.  Past medical history,surgical history, family history and social history were all reviewed and documented in the EPIC chart.  Gynecologic History Patient's last menstrual period was 07/13/2016.  Obstetric History OB History  Gravida Para Term Preterm AB Living  2 2       3   SAB TAB Ectopic Multiple Live Births        1      # Outcome Date GA Lbr Len/2nd Weight Sex Delivery Anes PTL Lv  2 Para           1 Para              ROS: A ROS was performed and pertinent positives and negatives are included in the history.  GENERAL: No fevers or chills. HEENT: No change in vision, no earache, sore throat or sinus congestion. NECK: No pain or stiffness. CARDIOVASCULAR: No chest pain or pressure. No palpitations. PULMONARY: No shortness of breath, cough or wheeze. GASTROINTESTINAL: No abdominal pain, nausea, vomiting or diarrhea, melena or bright red blood per rectum. GENITOURINARY: No urinary frequency, urgency, hesitancy or dysuria. MUSCULOSKELETAL: No joint or muscle pain, no back pain, no recent trauma. DERMATOLOGIC: No rash, no itching, no lesions. ENDOCRINE: No polyuria, polydipsia, no heat or cold intolerance. No recent change in weight. HEMATOLOGICAL: No anemia or easy bruising or bleeding. NEUROLOGIC: No  headache, seizures, numbness, tingling or weakness. PSYCHIATRIC: No depression, no loss of interest in normal activity or change in sleep pattern.     Exam:   BP 126/84   Ht 5' 5.5" (1.664 m)   Wt 238 lb (108 kg)   LMP 07/13/2016   BMI 39.00 kg/m   Body mass index is 39 kg/m.  General appearance : Well developed well nourished female. No acute distress HEENT: Eyes: no retinal hemorrhage or exudates,  Neck supple, trachea midline, no carotid bruits, no thyroidmegaly Lungs: Clear to auscultation, no rhonchi or wheezes, or rib retractions  Heart: Regular rate and rhythm, no murmurs or gallops Breast:Examined in sitting and supine position were symmetrical in appearance, no palpable masses or tenderness,  no skin retraction, no nipple inversion, no nipple discharge, no skin discoloration, no axillary or supraclavicular lymphadenopathy Abdomen: no palpable masses or tenderness, no rebound or guarding Extremities: no edema or skin discoloration or tenderness  Pelvic: Vulva: Anterior/mid vulva normal.  Posterior vulva and perianal area depigmented.  No inflammation.               Vagina: No gross lesions or discharge.  Pap reflex done.  Cervix/Uterus absent  Adnexa  Without masses or tenderness  Anus: Normal   Assessment/Plan:  54 y.o. female for annual exam   1. Encounter for Papanicolaou smear of vagina as part of routine gynecological examination  Gynecologic exam S/P TAH/Bilateral Salpingectomy. Pap test reflex done on the vaginal vault today. Breast exam normal. Last screening mammogram December 2020 was negative. Colonoscopy in 2019. Health labs with family physician.  2. S/P TAH (total abdominal hysterectomy)  3. Menopause Probably in menopause, will confirm with an [facility] today. - FSH  4. Post-menopausal atrophic vaginitis Complains of dryness and pain with intercourse. Decision to start on topical estradiol cream. No contraindication. Usage reviewed. Compound Estrogen Cream  sent to [facility].  5. Skin depigmentation Vulvar and perianal skin depigmentation probably vitiligo. Possible lichen sclerosis. Currently asymptomatic. Will f/u if becomes symptomatic again.  6. Class 2 severe obesity due to excess calories with serious comorbidity and body mass index (BMI) of 39.0 to 39.9 in adult [facility]) Recommend a low calorie/carb diet such as [employer]. Aerobic physical activities 5 times a week and weightlifting every 2 days.  Princess Bruins MD, 2:20 PM 10/28/2019

## 2019-10-29 DIAGNOSIS — Z1272 Encounter for screening for malignant neoplasm of vagina: Secondary | ICD-10-CM | POA: Diagnosis not present

## 2019-10-29 DIAGNOSIS — Z01419 Encounter for gynecological examination (general) (routine) without abnormal findings: Secondary | ICD-10-CM | POA: Diagnosis not present

## 2019-10-29 LAB — FOLLICLE STIMULATING HORMONE: FSH: 69.7 m[IU]/mL

## 2019-10-30 ENCOUNTER — Telehealth: Payer: Self-pay | Admitting: *Deleted

## 2019-10-30 LAB — PAP IG W/ RFLX HPV ASCU

## 2019-10-30 MED ORDER — NONFORMULARY OR COMPOUNDED ITEM: 4 refills | Status: DC

## 2019-10-30 NOTE — Telephone Encounter (Signed)
Rx called in 

## 2019-10-30 NOTE — Telephone Encounter (Signed)
-----   Message from Princess Bruins, MD sent at 10/28/2019  2:49 PM EST ----- Regarding: Prescription to [facility] Compound Estrogen cream.  1/4 applicator and thin layer on vulva twice a week.  Prescribe for 3 months, refill x 4.

## 2019-11-02 ENCOUNTER — Encounter: Payer: Self-pay | Admitting: Obstetrics & Gynecology

## 2019-11-02 NOTE — Patient Instructions (Signed)
1. Encounter for Papanicolaou smear of vagina as part of routine gynecological examination Gynecologic exam S/P TAH/Bilateral Salpingectomy. Pap test reflex done on the vaginal vault today. Breast exam normal. Last screening mammogram December 2020 was negative. Colonoscopy in 2019. Health labs with family physician.  2. S/P TAH (total abdominal hysterectomy)  3. Menopause Probably in menopause, will confirm with an [facility] today. - FSH  4. Post-menopausal atrophic vaginitis Complains of dryness and pain with intercourse. Decision to start on topical estradiol cream. No contraindication. Usage reviewed. Compound Estrogen Cream sent to [facility].  5. Skin depigmentation Vulvar and perianal skin depigmentation probably vitiligo. Possible lichen sclerosis. Currently asymptomatic. Will f/u if becomes symptomatic again.  6. Class 2 severe obesity due to excess calories with serious comorbidity and body mass index (BMI) of 39.0 to 39.9 in adult [facility] Recommend a low calorie/carb diet such as [employer]. Aerobic physical activities 5 times a week and weightlifting every 2 days.  [place], it was a pleasure meeting you today! I will inform you of your results as soon as they are available.

## 2019-11-26 ENCOUNTER — Encounter: Payer: Self-pay | Admitting: Family Medicine

## 2019-12-12 ENCOUNTER — Other Ambulatory Visit: Payer: Self-pay | Admitting: Family Medicine

## 2019-12-12 DIAGNOSIS — I10 Essential (primary) hypertension: Secondary | ICD-10-CM

## 2019-12-22 DIAGNOSIS — Z20828 Contact with and (suspected) exposure to other viral communicable diseases: Secondary | ICD-10-CM | POA: Diagnosis not present

## 2020-01-19 ENCOUNTER — Encounter: Payer: Self-pay | Admitting: Family Medicine

## 2020-01-29 ENCOUNTER — Other Ambulatory Visit: Payer: Self-pay

## 2020-01-29 ENCOUNTER — Ambulatory Visit: Payer: BLUE CROSS/BLUE SHIELD | Admitting: Family Medicine

## 2020-01-29 ENCOUNTER — Encounter: Payer: Self-pay | Admitting: Family Medicine

## 2020-01-29 VITALS — BP 120/80 | HR 68 | Temp 98.4°F | Wt 237.8 lb

## 2020-01-29 DIAGNOSIS — H9312 Tinnitus, left ear: Secondary | ICD-10-CM | POA: Insufficient documentation

## 2020-01-29 DIAGNOSIS — I10 Essential (primary) hypertension: Secondary | ICD-10-CM

## 2020-01-29 NOTE — Patient Instructions (Signed)

## 2020-01-29 NOTE — Progress Notes (Signed)
Subjective:    Patient ID: Mia Davis, female    DOB: 07/15/1966, 54 y.o.   MRN: [id number]  HPI Chief Complaint  Patient presents with  . ringing in left ear    ringing in left ear- going on for months.   Complains of ringing in her left ear for the past 3-4 months.  Ringing is constant, not pulsatile at all. She used to only hear it at night. Now it is all the time. Becoming more bothersome. No fullness, ear pain, headache or dizziness.   No recent illness. No new medications.   No fever, chills, TMJ pain, neck pain, chest pain, palpitations, shortness of breath, cough, abdominal pain, N/v/D, urinary symptoms or LE edema.    History of environmental noise 20 years ago.  She recalls wearing ear protection.   She puts the end of a bobby pin in her ear to scratch it sometimes.   Recently received J [employer] J Covid vaccine.   Reviewed allergies, medications, past medical, surgical, family, and social history.    Review of Systems Pertinent positives and negatives in the history of present illness.     Objective:   Physical Exam Constitutional:      General: She is not in acute distress.    Appearance: Normal appearance. She is not ill-appearing.  HENT:     Head:     Jaw: No tenderness or pain on movement.     Salivary Glands: Right salivary gland is not diffusely enlarged or tender. Left salivary gland is not diffusely enlarged or tender.     Right Ear: Hearing, tympanic membrane and ear canal normal. No mastoid tenderness.     Left Ear: Hearing, tympanic membrane and ear canal normal. No mastoid tenderness.  Eyes:     Extraocular Movements: Extraocular movements intact.     Conjunctiva/sclera: Conjunctivae normal.     Pupils: Pupils are equal, round, and reactive to light.  Neck:     Thyroid: No thyromegaly or thyroid tenderness.     Vascular: No carotid bruit.  Cardiovascular:     Rate and Rhythm: Normal rate and regular rhythm.     Pulses: Normal pulses.      Heart sounds: Normal heart sounds.  Pulmonary:     Effort: Pulmonary effort is normal.     Breath sounds: Normal breath sounds.  Musculoskeletal:     Cervical back: Normal range of motion and neck supple. No tenderness.     Right lower leg: No edema.     Left lower leg: No edema.  Lymphadenopathy:     Cervical: No cervical adenopathy.  Skin:    General: Skin is warm and dry.  Neurological:     Mental Status: She is alert and oriented to person, place, and time.     Cranial Nerves: Cranial nerves are intact. No facial asymmetry.     Sensory: Sensation is intact.     Motor: Motor function is intact.    BP 120/80   Pulse 68   Temp 98.4 F (36.9 C)   Wt 237 lb 12.8 oz (107.9 kg)   LMP 07/13/2016   BMI 38.97 kg/m        Assessment & Plan:  New onset tinnitus of left ear - Plan: Ambulatory referral to ENT, CBC with Differential/Platelet, Comprehensive metabolic panel, TSH, T4, free  Essential hypertension - Plan: CBC with Differential/Platelet, Comprehensive metabolic panel  No red flag symptoms. Non-pulsatile.  Unclear etiology.  Will refer to ENT for further  evaluation.  No significant hearing loss per in office basic hearing test.  BP in goal range. Will check labs and follow up.

## 2020-01-30 ENCOUNTER — Encounter: Payer: Self-pay | Admitting: Family Medicine

## 2020-01-30 LAB — COMPREHENSIVE METABOLIC PANEL
ALT: 16 IU/L (ref 0–32)
AST: 17 IU/L (ref 0–40)
Albumin/Globulin Ratio: 1.4 (ref 1.2–2.2)
Albumin: 3.9 g/dL (ref 3.8–4.9)
Alkaline Phosphatase: 102 IU/L (ref 39–117)
BUN/Creatinine Ratio: 11 (ref 9–23)
BUN: 14 mg/dL (ref 6–24)
Bilirubin Total: 1.1 mg/dL (ref 0.0–1.2)
CO2: 21 mmol/L (ref 20–29)
Calcium: 8.9 mg/dL (ref 8.7–10.2)
Chloride: 104 mmol/L (ref 96–106)
Creatinine, Ser: 1.24 mg/dL — ABNORMAL HIGH (ref 0.57–1.00)
GFR calc Af Amer: 57 mL/min/{1.73_m2} — ABNORMAL LOW (ref >59)
GFR calc non Af Amer: 49 mL/min/{1.73_m2} — ABNORMAL LOW (ref >59)
Globulin, Total: 2.8 g/dL (ref 1.5–4.5)
Glucose: 153 mg/dL — ABNORMAL HIGH (ref 65–99)
Potassium: 4.1 mmol/L (ref 3.5–5.2)
Sodium: 137 mmol/L (ref 134–144)
Total Protein: 6.7 g/dL (ref 6.0–8.5)

## 2020-01-30 LAB — CBC WITH DIFFERENTIAL/PLATELET
Basophils Absolute: 0 10*3/uL (ref 0.0–0.2)
Basos: 0 %
EOS (ABSOLUTE): 0.2 10*3/uL (ref 0.0–0.4)
Eos: 2 %
Hematocrit: 43.5 % (ref 34.0–46.6)
Hemoglobin: 14.2 g/dL (ref 11.1–15.9)
Immature Grans (Abs): 0 10*3/uL (ref 0.0–0.1)
Immature Granulocytes: 0 %
Lymphocytes Absolute: 2.6 10*3/uL (ref 0.7–3.1)
Lymphs: 32 %
MCH: 25.5 pg — ABNORMAL LOW (ref 26.6–33.0)
MCHC: 32.6 g/dL (ref 31.5–35.7)
MCV: 78 fL — ABNORMAL LOW (ref 79–97)
Monocytes Absolute: 0.5 10*3/uL (ref 0.1–0.9)
Monocytes: 7 %
Neutrophils Absolute: 4.6 10*3/uL (ref 1.4–7.0)
Neutrophils: 59 %
Platelets: 349 10*3/uL (ref 150–450)
RBC: 5.56 x10E6/uL — ABNORMAL HIGH (ref 3.77–5.28)
RDW: 14.5 % (ref 11.7–15.4)
WBC: 7.9 10*3/uL (ref 3.4–10.8)

## 2020-01-30 LAB — TSH: TSH: 0.873 u[IU]/mL (ref 0.450–4.500)

## 2020-01-30 LAB — T4, FREE: Free T4: 1.15 ng/dL (ref 0.82–1.77)

## 2020-02-01 ENCOUNTER — Encounter: Payer: Self-pay | Admitting: Family Medicine

## 2020-02-09 ENCOUNTER — Ambulatory Visit: Payer: BLUE CROSS/BLUE SHIELD | Admitting: Family Medicine

## 2020-02-09 ENCOUNTER — Encounter: Payer: Self-pay | Admitting: Family Medicine

## 2020-02-09 ENCOUNTER — Ambulatory Visit
Admission: RE | Admit: 2020-02-09 | Discharge: 2020-02-09 | Disposition: A | Discharge status: Home / Self Care | Payer: BLUE CROSS/BLUE SHIELD | Source: Ambulatory Visit | Attending: Family Medicine | Admitting: Family Medicine

## 2020-02-09 ENCOUNTER — Other Ambulatory Visit: Payer: Self-pay

## 2020-02-09 VITALS — BP 130/80 | HR 99 | Temp 97.7°F

## 2020-02-09 DIAGNOSIS — M25561 Pain in right knee: Secondary | ICD-10-CM

## 2020-02-09 DIAGNOSIS — M25461 Effusion, right knee: Secondary | ICD-10-CM | POA: Diagnosis not present

## 2020-02-09 NOTE — Patient Instructions (Signed)
Take 2 Aleve twice daily with food. You may take 500 mg or 1,000 mg of Tylenol daily as well.  Use ice. Elevate your leg when you can and try over the counter Voltaren gel.   Follow up with me in 1-2 weeks, sooner if getting worse.

## 2020-02-09 NOTE — Progress Notes (Signed)
   Subjective:    Patient ID: Mia Davis, female    DOB: 1966-08-07, 54 y.o.   MRN: [id number]  HPI Chief Complaint  Patient presents with  . knee pain    walking down stairs at work and heard a pop. having trouble walking on it. not worker's comp as no-slip,trip or fall   Complains of right knee pain for the past 5 days. States she was walking down steps and heard a "pop". No fall or injury. No hx of surgery or injury to her right knee.  Pain is medial. States pain was worse the initial 3 days and has been improving. Pain is 5/10 and worse with weight bearing. Pain severe with going up and down steps.  States her knee does not feel unstable. No giving away. No locking. Still has popping.   No other joint pain.   No fever, chills, nausea, vomiting.   Reviewed allergies, medications, past medical, surgical, family, and social history.    Review of Systems Pertinent positives and negatives in the history of present illness.     Objective:   Physical Exam BP 130/80   Pulse 99   Temp 97.7 F (36.5 C)   LMP 07/13/2016   Alert and oriented and in no acute distress. Right knee with slightly increased warmth, mild edema, medial joint line TTP, limited ROM due to pain. Unable to perform adequate testing due to guarding. Distal pulses intact, normal sensation and ROM of her right ankle and foot. Antalgic gait.       Assessment & Plan:  Acute pain of right knee - Plan: DG Knee Complete 4 Views Right  No sign of infectious joint. RLE is neurovascularly intact.  Unable to perform a full exam on her due to pain. Dr. Redmond School also examined patient. We will send her for an XR and treat her conservatively for now. Take 2 Aleve twice daily and may take Tylenol as well. Use ice, topical pain medication and elevate when seated. She may get crutches if she would like and bring them by for instructions on how to use them safely. Follow up in 1-2 weeks. Refer to ortho if needed.

## 2020-02-19 ENCOUNTER — Encounter: Payer: Self-pay | Admitting: Family Medicine

## 2020-02-19 ENCOUNTER — Other Ambulatory Visit: Payer: Self-pay

## 2020-02-19 ENCOUNTER — Ambulatory Visit: Payer: BLUE CROSS/BLUE SHIELD | Admitting: Surgery

## 2020-02-19 ENCOUNTER — Encounter: Payer: Self-pay | Admitting: Surgery

## 2020-02-19 ENCOUNTER — Ambulatory Visit: Payer: BLUE CROSS/BLUE SHIELD | Admitting: Family Medicine

## 2020-02-19 VITALS — Ht 65.5 in | Wt 237.8 lb

## 2020-02-19 VITALS — BP 140/84 | HR 69 | Temp 97.8°F

## 2020-02-19 DIAGNOSIS — M25561 Pain in right knee: Secondary | ICD-10-CM

## 2020-02-19 DIAGNOSIS — M25461 Effusion, right knee: Secondary | ICD-10-CM

## 2020-02-19 DIAGNOSIS — M1711 Unilateral primary osteoarthritis, right knee: Secondary | ICD-10-CM

## 2020-02-19 MED ORDER — METHYLPREDNISOLONE ACETATE 40 MG/ML IJ SUSP
80.0000 mg | INTRAMUSCULAR | Status: AC | PRN
  Administered 2020-02-19: 80 mg via INTRA_ARTICULAR

## 2020-02-19 MED ORDER — BUPIVACAINE HCL 0.25 % IJ SOLN
6.0000 mL | INTRAMUSCULAR | Status: AC | PRN
  Administered 2020-02-19: 6 mL via INTRA_ARTICULAR

## 2020-02-19 MED ORDER — LIDOCAINE HCL 1 % IJ SOLN
3.0000 mL | INTRAMUSCULAR | Status: AC | PRN
  Administered 2020-02-19: 3 mL

## 2020-02-19 NOTE — Progress Notes (Signed)
   Subjective:    Patient ID: Mia Davis, female    DOB: [birth year], 54 y.o.   MRN: [id number]  HPI Chief Complaint  Patient presents with  . follow-up    follow-up on pain.    She is here for follow-up on severe right knee pain.  Pain started approximately 2 weeks ago after walking down steps.  She reportedly heard a "pop".  Did not fall or twist her knee.  No history of knee surgery. Her x-ray showed degenerative changes with a small joint effusion.  She has been taking 2 Aleve twice daily and Tylenol 1000 mg twice daily. She has not been using the Voltaren gel I recommended.  Reports no significant improvement of her pain.  States the popping has gotten worse.  She is still limping significantly. Denies any other arthralgias or myalgias.  No new symptoms.   Review of Systems Pertinent positives and negatives in the history of present illness.     Objective:   Physical Exam BP 140/84   Pulse 69   Temp 97.8 F (36.6 C)   LMP 07/13/2016   Alert and oriented in no acute distress.  Right lower extremity is neurovascularly intact.  Right knee pain with minimal swelling.  No erythema.  Severe TTP to medial joint line and limited range of motion on exam.  Antalgic gait.     Assessment & Plan:  Pain and swelling of right knee - Plan: Ambulatory referral to Orthopedic Surgery  Knee pain has not improved over the past week as I had hoped with conservative treatment.  Reviewed x-ray results with patient.  I will refer her to Ortho for further evaluation and treatment.

## 2020-02-19 NOTE — Patient Instructions (Signed)
I am referring you to [facility] [address] [place]

## 2020-02-19 NOTE — Progress Notes (Signed)
Office Visit Note   Patient: Mia Davis           Date of Birth: [birth year]           MRN: [id number] Visit Date: 02/19/2020              Requested by: Girtha Rm, NP-C [place],  [place] [address] PCP: Girtha Rm, NP-C   Assessment & Plan: Visit Diagnoses:  1. Acute pain of right knee   2. Arthritis of right knee     Plan: In hopes to give patient some relief of her right knee pain offered injection.  After patient consent right knee was prepped with Betadine and intra-articular Marcaine/Depo-Medrol injection was performed.  Tolerated procedure well without complication.  I will have her follow-up in the office with Dr. Lorin Mercy in 4 weeks for recheck.  If right knee continues to be symptomatic we may consider getting an MRI to better evaluate the extent of her degenerative changes and also rule out medial meniscal tear.  Follow-Up Instructions: Return in about 4 weeks (around 03/18/2020) for with dr yates recheck right knee.   Orders:  Orders Placed This Encounter  Procedures  . Large Joint Inj   No orders of the defined types were placed in this encounter.     Procedures: Large Joint Inj on 02/19/2020 4:11 PM Indications: pain Details: 25 G 1.5 in needle, anteromedial approach Medications: 3 mL lidocaine 1 %; 6 mL bupivacaine 0.25 %; 80 mg methylPREDNISolone acetate 40 MG/ML Outcome: tolerated well, no immediate complications Consent was given by the patient.       Clinical Data: No additional findings.   Subjective: Chief Complaint  Patient presents with  . Right Knee - Pain    HPI 54 year old black female who is new patient to the clinic comes in with complaints of right knee pain.  She was referred by Dr. Lanice Shirts office.  Knee pain ongoing about 3 weeks.  Denies any specific injury.  States that around onset she was walking down some stairs when she felt something pop.  Shortly after she began having increased pain along the  medial joint line with some swelling.  She has been followed by her primary care nurse practitioner for this.  No improvement with Aleve or Tylenol.  She was sent for x-rays and that report showed no fracture or dislocation of the right knee.  Mild tricompartmental joint space narrowing and osteophytosis.  Small, nonspecific knee joint effusion.  Knee pain when she is ambulating, squatting down.  She denies any previous problems before onset. Review of Systems No current cardiac pulmonary GI GU issues  Objective: Vital Signs: Ht 5' 5.5" (1.664 m)   Wt 237 lb 12.8 oz (107.9 kg)   LMP 07/13/2016   BMI 38.97 kg/m   Physical Exam HENT:     Head: Normocephalic and atraumatic.  Eyes:     Extraocular Movements: Extraocular movements intact.     Pupils: Pupils are equal, round, and reactive to light.  Pulmonary:     Effort: No respiratory distress.  Musculoskeletal:     Comments: Gait is antalgic.  Right knee she has good range of motion below discomfort.  Some swelling without large effusion.  She is exquisitely tender at the medial joint line and lesser laterally.  Positive patellofemoral crepitus.  Negative McMurray's test.  Neurological:     General: No focal deficit present.     Mental Status: She is alert and oriented to  person, place, and time.  Psychiatric:        Mood and Affect: Mood normal.     Ortho Exam  Specialty Comments:  No specialty comments available.  Imaging: No results found.   PMFS History: Patient Active Problem List   Diagnosis Date Noted  . New onset tinnitus of left ear 01/29/2020  . Family history of diabetes mellitus in mother 08/22/2019  . Obesity (BMI 30-39.9) 08/21/2018  . Diverticula of colon 01/01/2018  . Colon polyps 01/01/2018  . S/P TAH (total abdominal hysterectomy) 09/12/2016  . Essential hypertension 05/12/2016  . Intraductal papilloma of left breast 05/06/2013   Past Medical History:  Diagnosis Date  . Anemia   . Colon polyps  01/01/2018  . Diverticula of colon 01/01/2018  . Hypertension     Family History  Problem Relation Age of Onset  . Diabetes Mother   . Hypertension Mother   . Diabetes Father   . Hypertension Father   . Heart attack Father 42  . Cancer Maternal Grandmother        pancreatic  . Diabetes Sister   . Colon cancer Neg Hx   . Colon polyps Neg Hx   . Esophageal cancer Neg Hx   . Stomach cancer Neg Hx   . Rectal cancer Neg Hx     Past Surgical History:  Procedure Laterality Date  . ABDOMINAL HYSTERECTOMY  2017   abdominal   . COLONOSCOPY    . HYSTERECTOMY ABDOMINAL WITH SALPINGECTOMY Bilateral 09/12/2016   Procedure: HYSTERECTOMY ABDOMINAL WITH SALPINGECTOMY;  Surgeon: Mora Bellman, MD;  Location: [facility];  Service: Gynecology;  Laterality: Bilateral;  . POLYPECTOMY    . TUBAL LIGATION     Social History   Occupational History  . Not on file  Tobacco Use  . Smoking status: Never Smoker  . Smokeless tobacco: Never Used  Substance and Sexual Activity  . Alcohol use: No  . Drug use: No  . Sexual activity: Not Currently    Birth control/protection: Surgical    Comment: 1st intercourse-11, partners- 5,

## 2020-02-23 ENCOUNTER — Ambulatory Visit (INDEPENDENT_AMBULATORY_CARE_PROVIDER_SITE_OTHER): Payer: BLUE CROSS/BLUE SHIELD | Admitting: Otolaryngology

## 2020-02-23 ENCOUNTER — Other Ambulatory Visit: Payer: Self-pay

## 2020-02-23 ENCOUNTER — Encounter (INDEPENDENT_AMBULATORY_CARE_PROVIDER_SITE_OTHER): Payer: Self-pay | Admitting: Otolaryngology

## 2020-02-23 VITALS — Temp 97.3°F

## 2020-02-23 DIAGNOSIS — H9312 Tinnitus, left ear: Secondary | ICD-10-CM | POA: Diagnosis not present

## 2020-02-23 NOTE — Progress Notes (Signed)
HPI: Mia Davis is a 54 y.o. female who presents is referred by Mack Hook, NP for evaluation of left ear buzzing that she has noticed since February.  She initially noticed this at night about 3 months ago.  She mostly notices it when things are quiet.  She has not noted any change in her hearing.  She denies any trauma to the head.  Denies any loud noise exposure although she used to work in [employer] several years ago and was exposed to loud noise in the 90s..  Past Medical History:  Diagnosis Date  . Anemia   . Colon polyps 01/01/2018  . Diverticula of colon 01/01/2018  . Hypertension    Past Surgical History:  Procedure Laterality Date  . ABDOMINAL HYSTERECTOMY  2017   abdominal   . COLONOSCOPY    . HYSTERECTOMY ABDOMINAL WITH SALPINGECTOMY Bilateral 09/12/2016   Procedure: HYSTERECTOMY ABDOMINAL WITH SALPINGECTOMY;  Surgeon: Mora Bellman, MD;  Location: [facility];  Service: Gynecology;  Laterality: Bilateral;  . POLYPECTOMY    . TUBAL LIGATION     Social History   Socioeconomic History  . Marital status: Divorced    Spouse name: Not on file  . Number of children: Not on file  . Years of education: Not on file  . Highest education level: Not on file  Occupational History  . Not on file  Tobacco Use  . Smoking status: Never Smoker  . Smokeless tobacco: Never Used  Substance and Sexual Activity  . Alcohol use: No  . Drug use: No  . Sexual activity: Not Currently    Birth control/protection: Surgical    Comment: 1st intercourse-11, partners- 5,   Other Topics Concern  . Not on file  Social History Narrative  . Not on file   Social Determinants of Health   Financial Resource Strain:   . Difficulty of Paying Living Expenses:   Food Insecurity:   . Worried About Charity fundraiser in the Last Year:   . Arboriculturist in the Last Year:   Transportation Needs:   . Film/video editor (Medical):   Marland Kitchen Lack of Transportation (Non-Medical):    Physical Activity:   . Days of Exercise per Week:   . Minutes of Exercise per Session:   Stress:   . Feeling of Stress :   Social Connections:   . Frequency of Communication with Friends and Family:   . Frequency of Social Gatherings with Friends and Family:   . Attends Religious Services:   . Active Member of Clubs or Organizations:   . Attends Archivist Meetings:   Marland Kitchen Marital Status:    Family History  Problem Relation Age of Onset  . Diabetes Mother   . Hypertension Mother   . Diabetes Father   . Hypertension Father   . Heart attack Father 85  . Cancer Maternal Grandmother        pancreatic  . Diabetes Sister   . Colon cancer Neg Hx   . Colon polyps Neg Hx   . Esophageal cancer Neg Hx   . Stomach cancer Neg Hx   . Rectal cancer Neg Hx    No Known Allergies Prior to Admission medications   Medication Sig Start Date End Date Taking? Authorizing Provider  amLODipine (NORVASC) 10 MG tablet TAKE 1 TABLET(10 MG) BY MOUTH DAILY 09/17/19  Yes Henson, Vickie L, NP-C  ibuprofen (ADVIL,MOTRIN) 600 MG tablet Take 1 tablet (600 mg total) by mouth  every 6 (six) hours as needed for fever or headache. 09/14/16  Yes Constant, Peggy, MD  losartan (COZAAR) 100 MG tablet TAKE 1 TABLET(100 MG) BY MOUTH DAILY 12/12/19  Yes Henson, Vickie L, NP-C  NONFORMULARY OR COMPOUNDED ITEM Estradiol vaginal cream 0.02% cream insert 1/4 applicator and thin layer on vulva twice a week. 10/30/19  Yes Princess Bruins, MD     Positive ROS: Otherwise negative  All other systems have been reviewed and were otherwise negative with the exception of those mentioned in the HPI and as above.  Physical Exam: Constitutional: Alert, well-appearing, no acute distress Ears: External ears without lesions or tenderness. Ear canals are clear bilaterally with intact, clear TMs bilaterally.  Auscultation of the ears revealed no objective tinnitus or sounds. Nasal: External nose without lesions. Septum midline  with mild rhinitis. Clear nasal passages with no signs of infection. Oral: Lips and gums without lesions. Tongue and palate mucosa without lesions. Posterior oropharynx clear. Neck: No palpable adenopathy or masses.  Auscultation of the neck reveals no carotid bruits Respiratory: Breathing comfortably  Skin: No facial/neck lesions or rash noted  Audiogram in the office today demonstrated mild hearing loss or borderline hearing in both ears with SRT's of 25 dB bilaterally.  Of note she had a mild drop in left ear hearing at 3000 frequency to 30 dB.  Procedures  Assessment: Minimal hearing loss most likely cause of left ear tinnitus.  No evidence of infection and no anatomical abnormalities noted on ear examination.  Plan: Discussed with her concerning limited treatment for tinnitus.  Discussed with her concerning using masking noise to help with the tinnitus when it is bothersome and to use ear protection when around loud noise.Marland Kitchen She will follow-up if the tinnitus worsens or she notices any change in her hearing.   Radene Journey, MD   CC:

## 2020-03-15 ENCOUNTER — Other Ambulatory Visit: Payer: Self-pay | Admitting: Family Medicine

## 2020-03-15 DIAGNOSIS — I10 Essential (primary) hypertension: Secondary | ICD-10-CM

## 2020-03-15 NOTE — Telephone Encounter (Signed)
Pt has an appt in November for cpe

## 2020-03-17 ENCOUNTER — Encounter: Payer: Self-pay | Admitting: Orthopaedic Surgery

## 2020-03-17 ENCOUNTER — Ambulatory Visit: Payer: BC Managed Care – PPO | Admitting: Orthopaedic Surgery

## 2020-03-17 ENCOUNTER — Ambulatory Visit: Payer: Self-pay

## 2020-03-17 VITALS — BP 138/78 | HR 72 | Ht 65.0 in | Wt 240.0 lb

## 2020-03-17 DIAGNOSIS — M2391 Unspecified internal derangement of right knee: Secondary | ICD-10-CM | POA: Insufficient documentation

## 2020-03-17 DIAGNOSIS — M25561 Pain in right knee: Secondary | ICD-10-CM

## 2020-03-17 HISTORY — DX: Unspecified internal derangement of right knee: M23.91

## 2020-03-17 NOTE — Progress Notes (Signed)
Office Visit Note   Patient: Mia Davis           Date of Birth: Feb 10, 1966           MRN: [id number] Visit Date: 03/17/2020              Requested by: Girtha Rm, NP-C [place],  [place] [address] PCP: Girtha Rm, NP-C   Assessment & Plan: Visit Diagnoses:  1. Acute pain of right knee   2. Knee locking, right     Plan: Persistent right knee symptoms likely medial meniscal tear symptomatic now for 7 weeks persistent pain limping difficulty sleeping failure to respond to anti-inflammatories and intra-articular injection.  We will proceed with MRI scan office follow-up after scan for review.  Follow-Up Instructions: No follow-ups on file.   Orders:  Orders Placed This Encounter  Procedures  . XR Knee 1-2 Views Right  . MR Knee Right w/o contrast   No orders of the defined types were placed in this encounter.     Procedures: No procedures performed   Clinical Data: No additional findings.   Subjective: Chief Complaint  Patient presents with  . Right Knee - Pain, Follow-up    HPI 54 year old female returns 4 weeks post injection 02/19/2020 with ongoing pain she states it may have improved slightly she still is having catching.  Originally first week in May she was walking suddenly felt a sharp pop and has had persistent right knee medial joint line pain since that time.  She has pain with hyperextension knee has been catching on her she has to be careful to avoid falling.  Review of Systems all systems updated unchanged from previous office visit.   Objective: Vital Signs: BP 138/78   Pulse 72   Ht 5\' 5"  (1.651 m)   Wt 240 lb (108.9 kg)   LMP 07/13/2016   BMI 39.94 kg/m   Physical Exam Constitutional:      Appearance: She is well-developed.  HENT:     Head: Normocephalic.     Right Ear: External ear normal.     Left Ear: External ear normal.  Eyes:     Pupils: Pupils are equal, round, and reactive to light.  Neck:      Thyroid: No thyromegaly.     Trachea: No tracheal deviation.  Cardiovascular:     Rate and Rhythm: Normal rate.  Pulmonary:     Effort: Pulmonary effort is normal.  Abdominal:     Palpations: Abdomen is soft.  Skin:    General: Skin is warm and dry.  Neurological:     Mental Status: She is alert and oriented to person, place, and time.  Psychiatric:        Behavior: Behavior normal.     Ortho Exam negative straight leg raising pain with hyperextension right knee there is significant medial joint line tenderness anterior medial collateral ligament.  Nontender patellar tendon calcific tendinopathy distal tendon.  Some crepitus with patellar loading quadriceps contracture negative patellar subluxation collateral crucial ligament exam is normal.  No palpable Baker's cyst distal pulses are 2 logroll to the hips.  No sciatic notch tenderness.  Specialty Comments:  No specialty comments available.  Imaging: XR Knee 1-2 Views Right  Result Date: 03/17/2020 Standing AP both knees lateral right knee obtained and reviewed.  This shows mild symmetrical bilateral degenerative changes with spurring mild medial joint line narrowing, some patellofemoral spurring.  There is calcification of the distal patella tendon  consistent with calcific tendinopathy. Impression: Mild right knee osteoarthritis    PMFS History: Patient Active Problem List   Diagnosis Date Noted  . Knee locking, right 03/17/2020  . New onset tinnitus of left ear 01/29/2020  . Family history of diabetes mellitus in mother 08/22/2019  . Obesity (BMI 30-39.9) 08/21/2018  . Diverticula of colon 01/01/2018  . Colon polyps 01/01/2018  . S/P TAH (total abdominal hysterectomy) 09/12/2016  . Essential hypertension 05/12/2016  . Intraductal papilloma of left breast 05/06/2013   Past Medical History:  Diagnosis Date  . Anemia   . Colon polyps 01/01/2018  . Diverticula of colon 01/01/2018  . Hypertension     Family History    Problem Relation Age of Onset  . Diabetes Mother   . Hypertension Mother   . Diabetes Father   . Hypertension Father   . Heart attack Father 58  . Cancer Maternal Grandmother        pancreatic  . Diabetes Sister   . Colon cancer Neg Hx   . Colon polyps Neg Hx   . Esophageal cancer Neg Hx   . Stomach cancer Neg Hx   . Rectal cancer Neg Hx     Past Surgical History:  Procedure Laterality Date  . ABDOMINAL HYSTERECTOMY  2017   abdominal   . COLONOSCOPY    . HYSTERECTOMY ABDOMINAL WITH SALPINGECTOMY Bilateral 09/12/2016   Procedure: HYSTERECTOMY ABDOMINAL WITH SALPINGECTOMY;  Surgeon: Mora Bellman, MD;  Location: [facility];  Service: Gynecology;  Laterality: Bilateral;  . POLYPECTOMY    . TUBAL LIGATION     Social History   Occupational History  . Not on file  Tobacco Use  . Smoking status: Never Smoker  . Smokeless tobacco: Never Used  Substance and Sexual Activity  . Alcohol use: No  . Drug use: No  . Sexual activity: Not Currently    Birth control/protection: Surgical    Comment: 1st intercourse-11, partners- 5,

## 2020-04-06 ENCOUNTER — Encounter: Payer: Self-pay | Admitting: Family Medicine

## 2020-04-06 ENCOUNTER — Encounter: Payer: Self-pay | Admitting: Orthopaedic Surgery

## 2020-04-15 ENCOUNTER — Encounter (INDEPENDENT_AMBULATORY_CARE_PROVIDER_SITE_OTHER): Payer: Self-pay

## 2020-04-16 ENCOUNTER — Other Ambulatory Visit: Payer: Self-pay | Admitting: Orthopaedic Surgery

## 2020-04-19 ENCOUNTER — Ambulatory Visit
Admission: RE | Admit: 2020-04-19 | Discharge: 2020-04-19 | Disposition: A | Discharge status: Home / Self Care | Payer: BC Managed Care – PPO | Source: Ambulatory Visit | Attending: Orthopaedic Surgery | Admitting: Orthopaedic Surgery

## 2020-04-19 ENCOUNTER — Other Ambulatory Visit: Payer: Self-pay

## 2020-04-19 DIAGNOSIS — M25561 Pain in right knee: Secondary | ICD-10-CM | POA: Diagnosis not present

## 2020-04-21 ENCOUNTER — Encounter: Payer: Self-pay | Admitting: Orthopaedic Surgery

## 2020-04-21 ENCOUNTER — Ambulatory Visit: Payer: BC Managed Care – PPO | Admitting: Orthopaedic Surgery

## 2020-04-21 DIAGNOSIS — S83241D Other tear of medial meniscus, current injury, right knee, subsequent encounter: Secondary | ICD-10-CM

## 2020-04-21 DIAGNOSIS — S83249A Other tear of medial meniscus, current injury, unspecified knee, initial encounter: Secondary | ICD-10-CM | POA: Insufficient documentation

## 2020-04-21 HISTORY — DX: Other tear of medial meniscus, current injury, unspecified knee, initial encounter: S83.249A

## 2020-04-21 MED ORDER — TRAMADOL HCL 50 MG PO TABS: 50.0000 mg | ORAL_TABLET | Freq: Four times a day (QID) | ORAL | 0 refills | Status: DC | PRN

## 2020-04-21 NOTE — Progress Notes (Signed)
Office Visit Note   Patient: Mia Davis           Date of Birth: [birth year]           MRN: [id number] Visit Date: 04/21/2020              Requested by: Girtha Rm, NP-C [place],  [place] [address] PCP: Girtha Rm, NP-C   Assessment & Plan: Visit Diagnoses:  1. Acute medial meniscus tear of right knee, subsequent encounter     Plan: Patient might proceed with outpatient arthroscopy due to her persistent knee symptoms.  She also requested a work note that she can work from home until after she convalesces for several weeks after the knee arthroscopy.  We discussed treatment for the meniscal tear which usually does well we will try to debride some of the arthritic areas as well which has been variable improvement.  She is having sharp catching in her knee and medial meniscal tear which extends from the mid body involves the anterior surface and likely has a subluxing fragment consistent with her sharp pain and catching.  Procedure discussed.  Questions elicited and answered she requests we proceed.  Follow-Up Instructions: No follow-ups on file.   Orders:  No orders of the defined types were placed in this encounter.  Meds ordered this encounter  Medications  . traMADol (ULTRAM) 50 MG tablet    Sig: Take 1 tablet (50 mg total) by mouth every 6 (six) hours as needed.    Dispense:  30 tablet    Refill:  0      Procedures: No procedures performed   Clinical Data: No additional findings.   Subjective: Chief Complaint  Patient presents with  . Right Knee - Pain    HPI 54 year old female returns with progressive right knee pain great difficulty walking she is on crutches.  She states she is barely able to make it to work her knee has been swelling she has pain at night she is requesting pain medication.  MRI scan has been obtained and is available for review with her today.  Onset of symptoms began when she felt a pop in her knee when she was  going down stairs in May 2021 and since that time her pain has been severe.  Patient has a horizontal tear that extends to the midportion of the medial meniscus transverse with changes in the medial collateral ligament consistent with a grade 1 sprain.  She does have some moderate medial compartment arthritis and some medial patellofemoral arthritis in addition to the meniscal tear.  Lateral compartment looks good.  ACL PCL is normal.  Patient has crutches today she is requesting a notes that she can work from home since she is having great difficulty walking and states she wants to proceed with knee arthroscopy due to her increased pain.  Patient states she has been miserable since May and at times her knee has sharp pain with locking and gets stuck and then it is hard for her to walk after that.  She states she is concerned she is going to fall.  Review of Systems 14 point systems negative other than as mentioned in HPI.   Objective: Vital Signs: LMP 07/13/2016 Pulse 74 height 5 feet 5 inches.  Weight 240.  BMI 39.  Physical Exam Constitutional:      Appearance: She is well-developed.  HENT:     Head: Normocephalic.     Right Ear: External ear normal.  Left Ear: External ear normal.  Eyes:     Pupils: Pupils are equal, round, and reactive to light.  Neck:     Thyroid: No thyromegaly.     Trachea: No tracheal deviation.  Cardiovascular:     Rate and Rhythm: Normal rate.  Pulmonary:     Effort: Pulmonary effort is normal.  Abdominal:     Palpations: Abdomen is soft.  Skin:    General: Skin is warm and dry.  Neurological:     Mental Status: She is alert and oriented to person, place, and time.  Psychiatric:        Behavior: Behavior normal.     Ortho Exam patient has significant medial joint line tenderness.  Tenderness both posteriorly in the popliteal fossa.  Collateral ligaments there is mild medial pain with valgus but no significant opening.  ACL PCL exam is normal.  Distal  pulses are 2 logroll the hips.  Specialty Comments:  No specialty comments available.  Imaging: No results found.   PMFS History: Patient Active Problem List   Diagnosis Date Noted  . Acute medial meniscal tear 04/21/2020  . Knee locking, right 03/17/2020  . New onset tinnitus of left ear 01/29/2020  . Family history of diabetes mellitus in mother 08/22/2019  . Obesity (BMI 30-39.9) 08/21/2018  . Diverticula of colon 01/01/2018  . Colon polyps 01/01/2018  . S/P TAH (total abdominal hysterectomy) 09/12/2016  . Essential hypertension 05/12/2016  . Intraductal papilloma of left breast 05/06/2013   Past Medical History:  Diagnosis Date  . Anemia   . Colon polyps 01/01/2018  . Diverticula of colon 01/01/2018  . Hypertension     Family History  Problem Relation Age of Onset  . Diabetes Mother   . Hypertension Mother   . Diabetes Father   . Hypertension Father   . Heart attack Father 79  . Cancer Maternal Grandmother        pancreatic  . Diabetes Sister   . Colon cancer Neg Hx   . Colon polyps Neg Hx   . Esophageal cancer Neg Hx   . Stomach cancer Neg Hx   . Rectal cancer Neg Hx     Past Surgical History:  Procedure Laterality Date  . ABDOMINAL HYSTERECTOMY  2017   abdominal   . COLONOSCOPY    . HYSTERECTOMY ABDOMINAL WITH SALPINGECTOMY Bilateral 09/12/2016   Procedure: HYSTERECTOMY ABDOMINAL WITH SALPINGECTOMY;  Surgeon: Mora Bellman, MD;  Location: [facility];  Service: Gynecology;  Laterality: Bilateral;  . POLYPECTOMY    . TUBAL LIGATION     Social History   Occupational History  . Not on file  Tobacco Use  . Smoking status: Never Smoker  . Smokeless tobacco: Never Used  Vaping Use  . Vaping Use: Never used  Substance and Sexual Activity  . Alcohol use: No  . Drug use: No  . Sexual activity: Not Currently    Birth control/protection: Surgical    Comment: 1st intercourse-11, partners- 5,

## 2020-04-26 ENCOUNTER — Other Ambulatory Visit: Payer: Self-pay | Admitting: Orthopaedic Surgery

## 2020-04-26 DIAGNOSIS — G8918 Other acute postprocedural pain: Secondary | ICD-10-CM | POA: Diagnosis not present

## 2020-04-26 DIAGNOSIS — M958 Other specified acquired deformities of musculoskeletal system: Secondary | ICD-10-CM | POA: Diagnosis not present

## 2020-04-26 DIAGNOSIS — M23221 Derangement of posterior horn of medial meniscus due to old tear or injury, right knee: Secondary | ICD-10-CM | POA: Diagnosis not present

## 2020-04-26 MED ORDER — HYDROCODONE-ACETAMINOPHEN 7.5-325 MG PO TABS: 1.0000 | ORAL_TABLET | Freq: Four times a day (QID) | ORAL | 0 refills | Status: DC | PRN

## 2020-04-26 NOTE — Progress Notes (Signed)
Percocet

## 2020-05-05 ENCOUNTER — Encounter: Payer: Self-pay | Admitting: Orthopaedic Surgery

## 2020-05-05 ENCOUNTER — Ambulatory Visit (INDEPENDENT_AMBULATORY_CARE_PROVIDER_SITE_OTHER): Payer: BC Managed Care – PPO | Admitting: Orthopaedic Surgery

## 2020-05-05 DIAGNOSIS — S83241D Other tear of medial meniscus, current injury, right knee, subsequent encounter: Secondary | ICD-10-CM

## 2020-05-05 NOTE — Progress Notes (Signed)
   Post-Op Visit Note   Patient: Mia Davis           Date of Birth: 05-08-1966           MRN: [id number] Visit Date: 05/05/2020 PCP: Girtha Rm, NP-C   Assessment & Plan: Post right knee arthroscopy with partial medial meniscectomy posteriorly.  Dressing change Steri-Strips applied she is walking some in the house without the crutches she is using crutches when she goes out of the house.  Work slip given for continue work from home for 3 weeks.  She will work on leg lifts knee flexion and full extension.  Arthroscopic findings discussed.  I plan to check her back again in 4 weeks.  Chief Complaint:  Chief Complaint  Patient presents with  . Right Knee - Routine Post Op   Visit Diagnoses:  1. Acute medial meniscus tear of right knee, subsequent encounter     Plan: Patient stopped taking her pain medication.  Incision looks good Steri-Strips applied recheck 4 weeks.  Follow-Up Instructions: No follow-ups on file.   Orders:  No orders of the defined types were placed in this encounter.  No orders of the defined types were placed in this encounter.   Imaging: No results found.  PMFS History: Patient Active Problem List   Diagnosis Date Noted  . Acute medial meniscal tear 04/21/2020  . Knee locking, right 03/17/2020  . New onset tinnitus of left ear 01/29/2020  . Family history of diabetes mellitus in mother 08/22/2019  . Obesity (BMI 30-39.9) 08/21/2018  . Diverticula of colon 01/01/2018  . Colon polyps 01/01/2018  . S/P TAH (total abdominal hysterectomy) 09/12/2016  . Essential hypertension 05/12/2016  . Intraductal papilloma of left breast 05/06/2013   Past Medical History:  Diagnosis Date  . Anemia   . Colon polyps 01/01/2018  . Diverticula of colon 01/01/2018  . Hypertension     Family History  Problem Relation Age of Onset  . Diabetes Mother   . Hypertension Mother   . Diabetes Father   . Hypertension Father   . Heart attack Father 58  . Cancer  Maternal Grandmother        pancreatic  . Diabetes Sister   . Colon cancer Neg Hx   . Colon polyps Neg Hx   . Esophageal cancer Neg Hx   . Stomach cancer Neg Hx   . Rectal cancer Neg Hx     Past Surgical History:  Procedure Laterality Date  . ABDOMINAL HYSTERECTOMY  2017   abdominal   . COLONOSCOPY    . HYSTERECTOMY ABDOMINAL WITH SALPINGECTOMY Bilateral 09/12/2016   Procedure: HYSTERECTOMY ABDOMINAL WITH SALPINGECTOMY;  Surgeon: Mora Bellman, MD;  Location: [facility];  Service: Gynecology;  Laterality: Bilateral;  . POLYPECTOMY    . TUBAL LIGATION     Social History   Occupational History  . Not on file  Tobacco Use  . Smoking status: Never Smoker  . Smokeless tobacco: Never Used  Vaping Use  . Vaping Use: Never used  Substance and Sexual Activity  . Alcohol use: No  . Drug use: No  . Sexual activity: Not Currently    Birth control/protection: Surgical    Comment: 1st intercourse-11, partners- 5,

## 2020-05-11 ENCOUNTER — Inpatient Hospital Stay: Payer: BC Managed Care – PPO | Admitting: Orthopaedic Surgery

## 2020-06-02 ENCOUNTER — Ambulatory Visit (INDEPENDENT_AMBULATORY_CARE_PROVIDER_SITE_OTHER): Payer: BC Managed Care – PPO | Admitting: Orthopaedic Surgery

## 2020-06-02 ENCOUNTER — Encounter: Payer: Self-pay | Admitting: Orthopaedic Surgery

## 2020-06-02 VITALS — BP 142/82 | HR 65 | Ht 65.0 in | Wt 240.0 lb

## 2020-06-02 DIAGNOSIS — M1711 Unilateral primary osteoarthritis, right knee: Secondary | ICD-10-CM | POA: Insufficient documentation

## 2020-06-02 DIAGNOSIS — S83241D Other tear of medial meniscus, current injury, right knee, subsequent encounter: Secondary | ICD-10-CM

## 2020-06-02 DIAGNOSIS — G8929 Other chronic pain: Secondary | ICD-10-CM | POA: Insufficient documentation

## 2020-06-02 NOTE — Progress Notes (Signed)
Post-Op Visit Note   Patient: Mia Davis           Date of Birth: September 01, 1966           MRN: [id number] Visit Date: 06/02/2020 PCP: Girtha Rm, NP-C   Assessment & Plan: Post right knee arthroscopy with grade 3 and 4 patellofemoral changes and grade 3 changes medial compartment with posterior meniscal tear.  She is having trouble getting from sitting standing BMI is at 39.  We discussed using some Aspercreme and riding exercise bike.  Her renal function slightly abnormal with creatinine 1.07 in 2019.  Creatinine gradually rose now is 1.24 she was using Aleve and ibuprofen.  I discussed her she needs to avoid these due to her kidney.  She will join the gym on exercise bike with low resistance work on dieting and weight loss.  Recheck 2 months.  We reviewed intraoperative photos of her knee and discussed that if her symptoms progress then total knee arthroplasty would be an option.  She is walking without her crutches.  Chief Complaint:  Chief Complaint  Patient presents with   Right Knee - Follow-up    04/26/2020 right knee arthroscopy, PMM   Visit Diagnoses:  1. Acute medial meniscus tear of right knee, subsequent encounter   2. Unilateral primary osteoarthritis, right knee     Plan: rov 2 MONTHS  Follow-Up Instructions: No follow-ups on file.   Orders:  No orders of the defined types were placed in this encounter.  No orders of the defined types were placed in this encounter.   Imaging: No results found.  PMFS History: Patient Active Problem List   Diagnosis Date Noted   Unilateral primary osteoarthritis, right knee 06/02/2020   Acute medial meniscal tear 04/21/2020   Knee locking, right 03/17/2020   New onset tinnitus of left ear 01/29/2020   Family history of diabetes mellitus in mother 08/22/2019   Obesity (BMI 30-39.9) 08/21/2018   Diverticula of colon 01/01/2018   Colon polyps 01/01/2018   S/P TAH (total abdominal hysterectomy) 09/12/2016    Essential hypertension 05/12/2016   Intraductal papilloma of left breast 05/06/2013   Past Medical History:  Diagnosis Date   Anemia    Colon polyps 01/01/2018   Diverticula of colon 01/01/2018   Hypertension     Family History  Problem Relation Age of Onset   Diabetes Mother    Hypertension Mother    Diabetes Father    Hypertension Father    Heart attack Father 35   Cancer Maternal Grandmother        pancreatic   Diabetes Sister    Colon cancer Neg Hx    Colon polyps Neg Hx    Esophageal cancer Neg Hx    Stomach cancer Neg Hx    Rectal cancer Neg Hx     Past Surgical History:  Procedure Laterality Date   ABDOMINAL HYSTERECTOMY  2017   abdominal    COLONOSCOPY     HYSTERECTOMY ABDOMINAL WITH SALPINGECTOMY Bilateral 09/12/2016   Procedure: HYSTERECTOMY ABDOMINAL WITH SALPINGECTOMY;  Surgeon: Mora Bellman, MD;  Location: [facility];  Service: Gynecology;  Laterality: Bilateral;   POLYPECTOMY     TUBAL LIGATION     Social History   Occupational History   Not on file  Tobacco Use   Smoking status: Never Smoker   Smokeless tobacco: Never Used  Vaping Use   Vaping Use: Never used  Substance and Sexual Activity   Alcohol use: No  Drug use: No   Sexual activity: Not Currently    Birth control/protection: Surgical    Comment: 1st intercourse-11, partners- 5,

## 2020-06-29 ENCOUNTER — Encounter: Payer: Self-pay | Admitting: Orthopaedic Surgery

## 2020-08-10 ENCOUNTER — Ambulatory Visit: Payer: BC Managed Care – PPO | Admitting: Orthopaedic Surgery

## 2020-08-12 ENCOUNTER — Other Ambulatory Visit: Payer: Self-pay | Admitting: Family Medicine

## 2020-08-12 DIAGNOSIS — I10 Essential (primary) hypertension: Secondary | ICD-10-CM

## 2020-08-12 NOTE — Telephone Encounter (Signed)
Left message for pt to call back to schedule an appt and we can refill for 30 days

## 2020-08-13 ENCOUNTER — Other Ambulatory Visit: Payer: Self-pay | Admitting: Family Medicine

## 2020-08-13 DIAGNOSIS — I10 Essential (primary) hypertension: Secondary | ICD-10-CM

## 2020-08-30 ENCOUNTER — Other Ambulatory Visit: Payer: Self-pay

## 2020-08-30 ENCOUNTER — Encounter: Payer: Self-pay | Admitting: Family Medicine

## 2020-08-30 ENCOUNTER — Ambulatory Visit (INDEPENDENT_AMBULATORY_CARE_PROVIDER_SITE_OTHER): Payer: BC Managed Care – PPO | Admitting: Family Medicine

## 2020-08-30 VITALS — BP 128/90 | HR 69 | Temp 97.5°F | Ht 65.75 in | Wt 241.0 lb

## 2020-08-30 DIAGNOSIS — Z23 Encounter for immunization: Secondary | ICD-10-CM

## 2020-08-30 DIAGNOSIS — R748 Abnormal levels of other serum enzymes: Secondary | ICD-10-CM | POA: Diagnosis not present

## 2020-08-30 DIAGNOSIS — I10 Essential (primary) hypertension: Secondary | ICD-10-CM | POA: Diagnosis not present

## 2020-08-30 DIAGNOSIS — Z0000 Encounter for general adult medical examination without abnormal findings: Secondary | ICD-10-CM | POA: Diagnosis not present

## 2020-08-30 DIAGNOSIS — E669 Obesity, unspecified: Secondary | ICD-10-CM | POA: Diagnosis not present

## 2020-08-30 DIAGNOSIS — R7989 Other specified abnormal findings of blood chemistry: Secondary | ICD-10-CM | POA: Diagnosis not present

## 2020-08-30 DIAGNOSIS — Z833 Family history of diabetes mellitus: Secondary | ICD-10-CM | POA: Diagnosis not present

## 2020-08-30 LAB — POCT URINALYSIS DIP (PROADVANTAGE DEVICE)
Bilirubin, UA: NEGATIVE
Blood, UA: NEGATIVE
Glucose, UA: NEGATIVE mg/dL
Ketones, POC UA: NEGATIVE mg/dL
Leukocytes, UA: NEGATIVE
Nitrite, UA: NEGATIVE
Protein Ur, POC: NEGATIVE mg/dL
Specific Gravity, Urine: 1.025
Urobilinogen, Ur: NEGATIVE
pH, UA: 5.5 (ref 5.0–8.0)

## 2020-08-30 NOTE — Patient Instructions (Addendum)
Benign your blood pressure and if your readings are consistently higher than 130/80, let me know.  Make sure you are eating healthy low-sodium snacks and meals.  Try to get at least 1500 cal/day.  Stay active.  You may have side effects from your flu and Pfizer Covid vaccine today.       Preventive Care 48-56 Years Old, Female Preventive care refers to visits with your health care provider and lifestyle choices that can promote health and wellness. This includes:  A yearly physical exam. This may also be called an annual well check.  Regular dental visits and eye exams.  Immunizations.  Screening for certain conditions.  Healthy lifestyle choices, such as eating a healthy diet, getting regular exercise, not using drugs or products that contain nicotine and tobacco, and limiting alcohol use. What can I expect for my preventive care visit? Physical exam Your health care provider will check your:  Height and weight. This may be used to calculate body mass index (BMI), which tells if you are at a healthy weight.  Heart rate and blood pressure.  Skin for abnormal spots. Counseling Your health care provider may ask you questions about your:  Alcohol, tobacco, and drug use.  Emotional well-being.  Home and relationship well-being.  Sexual activity.  Eating habits.  Work and work Statistician.  Method of birth control.  Menstrual cycle.  Pregnancy history. What immunizations do I need?  Influenza (flu) vaccine  This is recommended every year. Tetanus, diphtheria, and pertussis (Tdap) vaccine  You may need a Td booster every 10 years. Varicella (chickenpox) vaccine  You may need this if you have not been vaccinated. Zoster (shingles) vaccine  You may need this after age 35. Measles, mumps, and rubella (MMR) vaccine  You may need at least one dose of MMR if you were born in 1957 or later. You may also need a second dose. Pneumococcal conjugate (PCV13)  vaccine  You may need this if you have certain conditions and were not previously vaccinated. Pneumococcal polysaccharide (PPSV23) vaccine  You may need one or two doses if you smoke cigarettes or if you have certain conditions. Meningococcal conjugate (MenACWY) vaccine  You may need this if you have certain conditions. Hepatitis A vaccine  You may need this if you have certain conditions or if you travel or work in places where you may be exposed to hepatitis A. Hepatitis B vaccine  You may need this if you have certain conditions or if you travel or work in places where you may be exposed to hepatitis B. Haemophilus influenzae type b (Hib) vaccine  You may need this if you have certain conditions. Human papillomavirus (HPV) vaccine  If recommended by your health care provider, you may need three doses over 6 months. You may receive vaccines as individual doses or as more than one vaccine together in one shot (combination vaccines). Talk with your health care provider about the risks and benefits of combination vaccines. What tests do I need? Blood tests  Lipid and cholesterol levels. These may be checked every 5 years, or more frequently if you are over 29 years old.  Hepatitis C test.  Hepatitis B test. Screening  Lung cancer screening. You may have this screening every year starting at age 62 if you have a 30-pack-year history of smoking and currently smoke or have quit within the past 15 years.  Colorectal cancer screening. All adults should have this screening starting at age 1 and continuing until age 79.  Your health care provider may recommend screening at age 10 if you are at increased risk. You will have tests every 1-10 years, depending on your results and the type of screening test.  Diabetes screening. This is done by checking your blood sugar (glucose) after you have not eaten for a while (fasting). You may have this done every 1-3 years.  Mammogram. This may be  done every 1-2 years. Talk with your health care provider about when you should start having regular mammograms. This may depend on whether you have a family history of breast cancer.  BRCA-related cancer screening. This may be done if you have a family history of breast, ovarian, tubal, or peritoneal cancers.  Pelvic exam and Pap test. This may be done every 3 years starting at age 34. Starting at age 17, this may be done every 5 years if you have a Pap test in combination with an HPV test. Other tests  Sexually transmitted disease (STD) testing.  Bone density scan. This is done to screen for osteoporosis. You may have this scan if you are at high risk for osteoporosis. Follow these instructions at home: Eating and drinking  Eat a diet that includes fresh fruits and vegetables, whole grains, lean protein, and low-fat dairy.  Take vitamin and mineral supplements as recommended by your health care provider.  Do not drink alcohol if: ? Your health care provider tells you not to drink. ? You are pregnant, may be pregnant, or are planning to become pregnant.  If you drink alcohol: ? Limit how much you have to 0-1 drink a day. ? Be aware of how much alcohol is in your drink. In the [place] one drink equals one 12 oz bottle of beer (355 mL), one 5 oz glass of wine (148 mL), or one 1 oz glass of hard liquor (44 mL). Lifestyle  Take daily care of your teeth and gums.  Stay active. Exercise for at least 30 minutes on 5 or more days each week.  Do not use any products that contain nicotine or tobacco, such as cigarettes, e-cigarettes, and chewing tobacco. If you need help quitting, ask your health care provider.  If you are sexually active, practice safe sex. Use a condom or other form of birth control (contraception) in order to prevent pregnancy and STIs (sexually transmitted infections).  If told by your health care provider, take low-dose aspirin daily starting at age 88. What's  next?  Visit your health care provider once a year for a well check visit.  Ask your health care provider how often you should have your eyes and teeth checked.  Stay up to date on all vaccines. This information is not intended to replace advice given to you by your health care provider. Make sure you discuss any questions you have with your health care provider. Document Revised: 06/06/2018 Document Reviewed: 06/06/2018 Elsevier Patient Education  2020 [employer].

## 2020-08-30 NOTE — Progress Notes (Signed)
Subjective:    Patient ID: Mia Davis, female    DOB: Jan 21, 1966, 54 y.o.   MRN: [id number]  HPI Chief Complaint  Patient presents with   other    fasting cpe    She is here for a complete physical exam. Last CPE: 08/28/2019  Other providers: Ortho- Dr. Lorin Mercy  OB/GYN- Dr. Dellis Filbert  GI- Dr. Havery Moros  Dentist- Kin Kiser   HTN- taking losartan 100 mg and amlodipine 10 mg daily.  She checks her BP at home occasionally.    Social history: Lives alone, 3 kids, 7 grandchildren, works for for [employer] at [employer], still works FT  Denies smoking, drinking alcohol, drug use  Diet: fairly unhealthy diet  Excerise: active job with [employer]  Immunizations: flu shot today   Health maintenance:  Mammogram: 09/26/2019 Colonoscopy: March 2019. Had polyps and due for recall in march 2022  Last Dental Exam: April 2021  Last Eye Exam: May 2021   Wears seatbelt always, smoke detectors in home and functioning, does not text while driving and feels safe in home environment.   Reviewed allergies, medications, past medical, surgical, family, and social history.\    Review of Systems Review of Systems Constitutional: -fever, -chills, -sweats, -unexpected weight change,-fatigue ENT: -runny nose, -ear pain, -sore throat Cardiology:  -chest pain, -palpitations, -edema Respiratory: -cough, -shortness of breath, -wheezing Gastroenterology: -abdominal pain, -nausea, -vomiting, -diarrhea, -constipation  Hematology: -bleeding or bruising problems Musculoskeletal: -arthralgias, -myalgias, -joint swelling, -back pain Ophthalmology: -vision changes Urology: -dysuria, -difficulty urinating, -hematuria, -urinary frequency, -urgency Neurology: -headache, -weakness, -tingling, -numbness       Objective:   Physical Exam BP 128/90    Pulse 69    Temp (!) 97.5 F (36.4 C)    Ht 5' 5.75" (1.67 m)    Wt 241 lb (109.3 kg)    LMP 07/13/2016    SpO2 98%    BMI 39.19 kg/m   General  Appearance:    Alert, cooperative, no distress, appears stated age  Head:    Normocephalic, without obvious abnormality, atraumatic  Eyes:    PERRL, conjunctiva/corneas clear, EOM's intact  Ears:    Normal TM's and external ear canals  Nose:   Mask on   Throat:   Mask on   Neck:   Supple, no lymphadenopathy;  thyroid:  no   enlargement/tenderness/nodules; no JVD  Back:    Spine nontender, no curvature, ROM normal, no CVA     tenderness  Lungs:     Clear to auscultation bilaterally without wheezes, rales or     ronchi; respirations unlabored  Chest Wall:    No tenderness or deformity   Heart:    Regular rate and rhythm, S1 and S2 normal, no murmur, rub   or gallop  Breast Exam:    OB/GYN  Abdomen:     Soft, non-tender, nondistended, normoactive bowel sounds,    no masses, no hepatosplenomegaly  Genitalia:    OB/GYN     Extremities:   No clubbing, cyanosis or edema  Pulses:   2+ and symmetric all extremities  Skin:   Skin color, texture, turgor normal, no rashes or lesions  Lymph nodes:   Cervical, supraclavicular, and axillary nodes normal  Neurologic:   CNII-XII intact, normal strength, sensation and gait; reflexes 2+ and symmetric throughout          Psych:   Normal mood, affect, hygiene and grooming.          Assessment & Plan:  Routine general medical examination at a health care facility - Plan: Lipid panel, CBC with Differential/Platelet, TSH, POCT Urinalysis DIP (Proadvantage Device) -Preventive health care reviewed.  She sees her OB/GYN regularly.  Colonoscopy appears to be due in 2022.  Counseling on healthy lifestyle including diet and exercise.  Immunizations reviewed.  Discussed safety and health promotion.  Essential hypertension - Plan: Comprehensive metabolic panel, CBC with Differential/Platelet -She is aware that her blood pressure is slightly elevated today.  I recommend keeping a closer eye on this at home.  Continue current medication.  Recommend low-sodium diet.   Follow-up if her blood pressures are not in goal range consistently.  Obesity (BMI 30-39.9) - Plan: Lipid panel, TSH, T4, free -She has reportedly been eating fewer calories than recommended.  I recommend that she eat small frequent meals throughout the day with plenty of protein and continue being physically active. -She is aware that this increases her risk for developing worsening chronic health conditions.  Family history of diabetes mellitus in mother  Elevated serum creatinine - Plan: Comprehensive metabolic panel -Continue monitoring.  Discussed importance of good blood pressure control and hydrating  Needs flu shot - Plan: Flu Vaccine QUAD 36+ mos IM  Low serum high density lipoprotein (HDL) - Plan: Lipid panel -Follow-up pending lipid results  COVID-19 vaccine administered - Plan: Pfizer SARS-COV-2 Vaccine -Discussed potential side effects

## 2020-08-31 LAB — COMPREHENSIVE METABOLIC PANEL
ALT: 15 IU/L (ref 0–32)
AST: 16 IU/L (ref 0–40)
Albumin/Globulin Ratio: 1.5 (ref 1.2–2.2)
Albumin: 4.3 g/dL (ref 3.8–4.9)
Alkaline Phosphatase: 103 IU/L (ref 44–121)
BUN/Creatinine Ratio: 14 (ref 9–23)
BUN: 16 mg/dL (ref 6–24)
Bilirubin Total: 1 mg/dL (ref 0.0–1.2)
CO2: 20 mmol/L (ref 20–29)
Calcium: 9.2 mg/dL (ref 8.7–10.2)
Chloride: 105 mmol/L (ref 96–106)
Creatinine, Ser: 1.18 mg/dL — ABNORMAL HIGH (ref 0.57–1.00)
GFR calc Af Amer: 60 mL/min/{1.73_m2} (ref >59)
GFR calc non Af Amer: 52 mL/min/{1.73_m2} — ABNORMAL LOW (ref >59)
Globulin, Total: 2.8 g/dL (ref 1.5–4.5)
Glucose: 109 mg/dL — ABNORMAL HIGH (ref 65–99)
Potassium: 4.4 mmol/L (ref 3.5–5.2)
Sodium: 139 mmol/L (ref 134–144)
Total Protein: 7.1 g/dL (ref 6.0–8.5)

## 2020-08-31 LAB — CBC WITH DIFFERENTIAL/PLATELET
Basophils Absolute: 0 10*3/uL (ref 0.0–0.2)
Basos: 0 %
EOS (ABSOLUTE): 0.2 10*3/uL (ref 0.0–0.4)
Eos: 2 %
Hematocrit: 43.4 % (ref 34.0–46.6)
Hemoglobin: 14 g/dL (ref 11.1–15.9)
Immature Grans (Abs): 0 10*3/uL (ref 0.0–0.1)
Immature Granulocytes: 0 %
Lymphocytes Absolute: 2.9 10*3/uL (ref 0.7–3.1)
Lymphs: 35 %
MCH: 25.6 pg — ABNORMAL LOW (ref 26.6–33.0)
MCHC: 32.3 g/dL (ref 31.5–35.7)
MCV: 79 fL (ref 79–97)
Monocytes Absolute: 0.6 10*3/uL (ref 0.1–0.9)
Monocytes: 7 %
Neutrophils Absolute: 4.6 10*3/uL (ref 1.4–7.0)
Neutrophils: 56 %
Platelets: 310 10*3/uL (ref 150–450)
RBC: 5.47 x10E6/uL — ABNORMAL HIGH (ref 3.77–5.28)
RDW: 15 % (ref 11.7–15.4)
WBC: 8.3 10*3/uL (ref 3.4–10.8)

## 2020-08-31 LAB — LIPID PANEL
Chol/HDL Ratio: 3.6 ratio (ref 0.0–4.4)
Cholesterol, Total: 159 mg/dL (ref 100–199)
HDL: 44 mg/dL (ref >39)
LDL Chol Calc (NIH): 98 mg/dL (ref 0–99)
Triglycerides: 90 mg/dL (ref 0–149)
VLDL Cholesterol Cal: 17 mg/dL (ref 5–40)

## 2020-08-31 LAB — T4, FREE: Free T4: 1.35 ng/dL (ref 0.82–1.77)

## 2020-08-31 LAB — TSH: TSH: 1.55 u[IU]/mL (ref 0.450–4.500)

## 2020-09-08 ENCOUNTER — Encounter: Payer: Self-pay | Admitting: Orthopaedic Surgery

## 2020-09-25 ENCOUNTER — Encounter: Payer: Self-pay | Admitting: Orthopaedic Surgery

## 2020-09-27 DIAGNOSIS — Z1231 Encounter for screening mammogram for malignant neoplasm of breast: Secondary | ICD-10-CM | POA: Diagnosis not present

## 2020-09-27 LAB — HM MAMMOGRAPHY

## 2020-09-28 ENCOUNTER — Encounter: Payer: Self-pay | Admitting: Family Medicine

## 2020-09-30 ENCOUNTER — Ambulatory Visit (HOSPITAL_COMMUNITY)
Admission: RE | Admit: 2020-09-30 | Discharge: 2020-09-30 | Disposition: A | Discharge status: Home / Self Care | Payer: BC Managed Care – PPO | Source: Ambulatory Visit | Attending: Vascular Surgery | Admitting: Vascular Surgery

## 2020-09-30 ENCOUNTER — Other Ambulatory Visit (HOSPITAL_COMMUNITY): Payer: Self-pay | Admitting: Family Medicine

## 2020-09-30 ENCOUNTER — Other Ambulatory Visit: Payer: Self-pay

## 2020-09-30 ENCOUNTER — Ambulatory Visit (INDEPENDENT_AMBULATORY_CARE_PROVIDER_SITE_OTHER): Payer: BC Managed Care – PPO | Admitting: Family Medicine

## 2020-09-30 ENCOUNTER — Encounter: Payer: Self-pay | Admitting: Family Medicine

## 2020-09-30 VITALS — BP 110/74 | HR 64 | Temp 98.3°F

## 2020-09-30 DIAGNOSIS — M25562 Pain in left knee: Secondary | ICD-10-CM

## 2020-09-30 DIAGNOSIS — R600 Localized edema: Secondary | ICD-10-CM | POA: Diagnosis not present

## 2020-09-30 DIAGNOSIS — M7989 Other specified soft tissue disorders: Secondary | ICD-10-CM

## 2020-09-30 NOTE — Progress Notes (Signed)
° °  Subjective:    Patient ID: Mia Davis, female    DOB: October 08, 1966, 54 y.o.   MRN: [id number]  HPI Chief Complaint  Patient presents with   left knee pain    Started over the weekend , no injury   Complains of 4-5 day history of posterior left knee pain. No known injury. Denies history of surgery on left knee. Recent right knee surgery for torn meniscus.   She also has increased edema and warmth of her left lower leg.   She has not had to take anything for the pain.   No fever, chills, chest pain, palpitations, shortness of breath, abdominal pain, N/V/D.    Review of Systems Pertinent positives and negatives in the history of present illness.     Objective:   Physical Exam BP 110/74    Pulse 64    Temp 98.3 F (36.8 C)    LMP 07/13/2016    SpO2 97%   Alert and in no distress.  Cardiac exam shows a regular sinus rhythm without murmurs or gallops. Lungs are clear to auscultation. Left knee with edema posteriorly with TTP. Left calf with increased warmth compared to right and left calf circumference is also larger. LLE is neurovascularly intact. Negative Homans.       Assessment & Plan:  Lower leg edema - Plan: VAS [place] LOWER EXTREMITY VENOUS (DVT)  Acute pain of left knee - Plan: VAS [place] LOWER EXTREMITY VENOUS (DVT)  Discussed that we will rule out DVT and follow up. She may need to follow up with Dr. Lorin Mercy as well if knee pain is not improving. Discussed conservative treatment.

## 2020-10-10 ENCOUNTER — Encounter: Payer: Self-pay | Admitting: Orthopaedic Surgery

## 2020-10-10 DIAGNOSIS — M1711 Unilateral primary osteoarthritis, right knee: Secondary | ICD-10-CM

## 2020-10-10 DIAGNOSIS — S83241D Other tear of medial meniscus, current injury, right knee, subsequent encounter: Secondary | ICD-10-CM

## 2020-10-11 ENCOUNTER — Encounter: Payer: Self-pay | Admitting: Family Medicine

## 2020-10-11 DIAGNOSIS — I10 Essential (primary) hypertension: Secondary | ICD-10-CM

## 2020-10-11 MED ORDER — AMLODIPINE BESYLATE 10 MG PO TABS: ORAL_TABLET | ORAL | 2 refills | Status: DC

## 2020-10-12 ENCOUNTER — Encounter: Payer: Self-pay | Admitting: Internal Medicine

## 2020-10-18 ENCOUNTER — Ambulatory Visit (INDEPENDENT_AMBULATORY_CARE_PROVIDER_SITE_OTHER): Payer: BC Managed Care – PPO | Admitting: Physical Therapy

## 2020-10-18 ENCOUNTER — Other Ambulatory Visit: Payer: Self-pay

## 2020-10-18 ENCOUNTER — Encounter: Payer: Self-pay | Admitting: Physical Therapy

## 2020-10-18 DIAGNOSIS — M25561 Pain in right knee: Secondary | ICD-10-CM

## 2020-10-18 DIAGNOSIS — M25661 Stiffness of right knee, not elsewhere classified: Secondary | ICD-10-CM

## 2020-10-18 DIAGNOSIS — R2689 Other abnormalities of gait and mobility: Secondary | ICD-10-CM

## 2020-10-18 DIAGNOSIS — M6281 Muscle weakness (generalized): Secondary | ICD-10-CM

## 2020-10-18 NOTE — Patient Instructions (Signed)
Access Code: [email or web address] URL: [email or web address] Date: 10/18/2020 Prepared by: Faustino Congress  Exercises Supine Quad Set - 3 x daily - 7 x weekly - 3 sets - 10 reps Seated Straight Leg Raise - 3 x daily - 7 x weekly - 3 sets - 10 reps Seated Hamstring Stretch - 2 x daily - 7 x weekly - 3 reps - 1 sets - 30 sec hold Seated Long Arc Quad - 2 x daily - 7 x weekly - 3 sets - 10 reps - 5 sec hold

## 2020-10-18 NOTE — Therapy (Signed)
[facility] Physical Therapy [address] [place], [place], [address] Phone: [phone]   Fax:  [phone]  Physical Therapy Evaluation  Patient Details  Name: Mia Davis MRN: [id number] Date of Birth: [birth year] Referring Provider (PT): Marybelle Killings, MD   Encounter Date: 10/18/2020   PT End of Session - 10/18/20 0841    Visit Number 1    Number of Visits 12    Date for PT Re-Evaluation 11/29/20    Authorization Type BCBS 30 visit limit    PT Start Time 0805    PT Stop Time 0833    PT Time Calculation (min) 28 min    Activity Tolerance Patient tolerated treatment well    Behavior During Therapy [facility] for tasks assessed/performed           Past Medical History:  Diagnosis Date  . Anemia   . Colon polyps 01/01/2018  . Diverticula of colon 01/01/2018  . Hypertension     Past Surgical History:  Procedure Laterality Date  . ABDOMINAL HYSTERECTOMY  2017   abdominal   . COLONOSCOPY    . HYSTERECTOMY ABDOMINAL WITH SALPINGECTOMY Bilateral 09/12/2016   Procedure: HYSTERECTOMY ABDOMINAL WITH SALPINGECTOMY;  Surgeon: Mora Bellman, MD;  Location: [facility];  Service: Gynecology;  Laterality: Bilateral;  . KNEE SURGERY Right   . POLYPECTOMY    . TUBAL LIGATION      There were no vitals filed for this visit.    Subjective Assessment - 10/18/20 0808    Subjective Pt is a 55 y/o female who presents to OPPT s/p Rt knee arthroscophy with meniscal debridement on 04/26/20 with constant pain since surgery.  She reports pain wose in morning, getting up out of bed and out of car with known dx of OA.  She wants to make sure nothing else is wrong with knee before considering TKA.    Pertinent History HTN, anemia, obesity    Limitations Standing;Walking    How long can you stand comfortably? <5 min    How long can you walk comfortably? improved after 3-5 min    Diagnostic tests surgery: known OA    Patient Stated Goals improve motion, strength, pain    Currently  in Pain? Yes    Pain Score 0-No pain   up to 7/10   Pain Location Knee    Pain Orientation Right    Pain Descriptors / Indicators Aching    Pain Type Chronic pain    Pain Onset More than a month ago    Pain Frequency Intermittent    Aggravating Factors  standing, walking, worse in AM, stairs    Pain Relieving Factors sitting, rest              [facility] PT Assessment - 10/18/20 0813      Assessment   Medical Diagnosis M17.11 (ICD-10-CM) - Unilateral primary osteoarthritis, right knee; S83.241D (ICD-10-CM) - Acute medial meniscus tear of right knee, subsequent encounter    Referring Provider (PT) Marybelle Killings, MD    Onset Date/Surgical Date 04/26/20    Hand Dominance Left    Next MD Visit PRN    Prior Therapy none      Precautions   Precautions None      Restrictions   Weight Bearing Restrictions No      Balance Screen   Has the patient fallen in the past 6 months No    Has the patient had a decrease in activity level because of a fear of falling?  No    Is the patient reluctant to leave their home because of a fear of falling?  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[employer]; seated work    Leisure Counselling psychologist   Overall Cognitive Status Within Functional Limits for tasks assessed      Observation/Other Assessments   Focus on Therapeutic Outcomes (FOTO)  41 (predicted 62)      ROM / Strength   AROM / PROM / Strength AROM;Strength      AROM   AROM Assessment Site Knee    Right/Left Knee Right;Left    Right Knee Extension -5    Right Knee Flexion  120    Left Knee Extension 0    Left Knee Flexion 120      Strength   Strength Assessment Site Knee    Right/Left Knee Right;Left    Right Knee Flexion 4/5    Right Knee Extension 4+/5    Left Knee Flexion 5/5    Left Knee Extension 5/5      Ambulation/Gait   Gait Pattern Decreased stance time - right;Decreased step length - left;Decreased hip/knee flexion - right;Antalgic                      Objective measurements completed on examination: See above findings.               PT Education - 10/18/20 0841    Education Details HEP    Person(s) Educated Patient    Methods Explanation;Demonstration;Handout    Comprehension Verbalized understanding;Returned demonstration;Need further instruction            PT Short Term Goals - 10/18/20 0946      PT SHORT TERM GOAL #1   Title independent with initial HEP    Status New    Target Date 11/08/20      PT SHORT TERM GOAL #2   Title improve Rt knee ext to 0 deg for improved function    Status New    Target Date 11/01/20             PT Long Term Goals - 10/18/20 0946      PT LONG TERM GOAL #1   Title independent with final HEP    Status New    Target Date 11/29/20      PT LONG TERM GOAL #2   Title amb without deviations for improved function and mobility    Status New    Target Date 11/29/20      PT LONG TERM GOAL #3   Title report 50% improvement in pain in AM for improved function    Status New    Target Date 11/29/20      PT LONG TERM GOAL #4   Title FOTO score improved to 62 for improved function    Status New    Target Date 11/29/20      PT LONG TERM GOAL #5  Title demonstrate 5/5 Rt knee strength for improved function    Status New    Target Date 11/29/20                  Plan - 10/18/20 0842    Clinical Impression Statement Pt is a 55 y/o female who present to OPPT s/p Rt knee scope on 04/26/20 with persistent pain since surgery, as well as now with known OA in Rt  knee.  She demonstrates gait abnormalities, mild swelling, decreased strength and Rt knee extension lacking 5 degrees.  Pt will benefit from PT to address deficits listed.    Personal Factors and Comorbidities Comorbidity 3+    Comorbidities HTN, anemia, obesity    Examination-Activity Limitations Bed Mobility;Squat;Stairs;Locomotion Level;Transfers    Examination-Participation Restrictions Occupation;Driving    Stability/Clinical Decision Making Evolving/Moderate complexity    Clinical Decision Making Moderate    Rehab Potential Good    PT Frequency 2x / week    PT Duration 6 weeks    PT Treatment/Interventions ADLs/Self Care Home Management;Cryotherapy;Electrical Stimulation;Moist Heat;Balance training;Therapeutic exercise;Therapeutic activities;Functional mobility training;Stair training;Gait training;Neuromuscular re-education;Patient/family education;Manual techniques;Vasopneumatic Device;Taping;Dry needling;Passive range of motion    PT Next Visit Plan review HEP, manual/exercises to work on [employer], modalities PRN for pain    PT Home Exercise Plan Access Code: [email or web address]    Consulted and Agree with Plan of Care Patient           Patient will benefit from skilled therapeutic intervention in order to improve the following deficits and impairments:  Abnormal gait,Pain,Increased fascial restricitons,Increased edema,Decreased scar mobility,Decreased activity tolerance,Decreased mobility,Decreased balance,Decreased range of motion,Difficulty walking,Impaired flexibility,Decreased strength  Visit Diagnosis: Acute pain of right knee - Plan: PT plan of care cert/re-cert  Stiffness of right knee, not elsewhere classified - Plan: PT plan of care cert/re-cert  Other abnormalities of gait and mobility - Plan: PT plan of care cert/re-cert  Muscle weakness (generalized) - Plan: PT plan of care cert/re-cert     Problem List Patient Active Problem List   Diagnosis Date Noted  . Elevated serum  creatinine 08/30/2020  . Low serum high density lipoprotein (HDL) 08/30/2020  . Unilateral primary osteoarthritis, right knee 06/02/2020  . Acute medial meniscal tear 04/21/2020  . Knee locking, right 03/17/2020  . New onset tinnitus of left ear 01/29/2020  . Family history of diabetes mellitus in mother 08/22/2019  . Obesity (BMI 30-39.9) 08/21/2018  . Diverticula of colon 01/01/2018  . Colon polyps 01/01/2018  . S/P TAH (total abdominal hysterectomy) 09/12/2016  . Essential hypertension 05/12/2016  . Intraductal papilloma of left breast 05/06/2013      Laureen Abrahams, PT, DPT 10/18/20 10:08 AM    [facility] Physical Therapy [address] [place], [place], [address] Phone: [phone]   Fax:  [phone]  Name: NOREEN GARFINKLE MRN: [id number] Date of Birth: Jul 13, 1966

## 2020-10-25 ENCOUNTER — Encounter: Payer: BC Managed Care – PPO | Admitting: Physical Therapy

## 2020-10-27 ENCOUNTER — Encounter: Payer: Self-pay | Admitting: Family Medicine

## 2020-10-27 DIAGNOSIS — M25562 Pain in left knee: Secondary | ICD-10-CM

## 2020-10-27 NOTE — Telephone Encounter (Signed)
That is fine 

## 2020-10-29 ENCOUNTER — Encounter: Payer: BC Managed Care – PPO | Admitting: Physical Therapy

## 2020-11-01 ENCOUNTER — Other Ambulatory Visit: Payer: Self-pay

## 2020-11-01 ENCOUNTER — Ambulatory Visit (INDEPENDENT_AMBULATORY_CARE_PROVIDER_SITE_OTHER): Payer: BC Managed Care – PPO | Admitting: Physical Therapy

## 2020-11-01 DIAGNOSIS — M25661 Stiffness of right knee, not elsewhere classified: Secondary | ICD-10-CM | POA: Diagnosis not present

## 2020-11-01 DIAGNOSIS — M6281 Muscle weakness (generalized): Secondary | ICD-10-CM | POA: Diagnosis not present

## 2020-11-01 DIAGNOSIS — R2689 Other abnormalities of gait and mobility: Secondary | ICD-10-CM

## 2020-11-01 DIAGNOSIS — M25561 Pain in right knee: Secondary | ICD-10-CM

## 2020-11-01 NOTE — Therapy (Signed)
[facility] Physical Therapy [address] [place], [place], [address] Phone: [phone]   Fax:  [phone]  Physical Therapy Treatment  Patient Details  Name: Mia Davis MRN: [id number] Date of Birth: Dec 31, 1965 Referring Provider (PT): Eldred Manges, MD   Encounter Date: 11/01/2020   PT End of Session - 11/01/20 0807    Visit Number 2    Number of Visits 12    Date for PT Re-Evaluation 11/29/20    Authorization Type BCBS 30 visit limit    PT Start Time 0802    PT Stop Time 0845    PT Time Calculation (min) 43 min    Activity Tolerance Patient tolerated treatment well    Behavior During Therapy [facility] for tasks assessed/performed           Past Medical History:  Diagnosis Date  . Anemia   . Colon polyps 01/01/2018  . Diverticula of colon 01/01/2018  . Hypertension     Past Surgical History:  Procedure Laterality Date  . ABDOMINAL HYSTERECTOMY  2017   abdominal   . COLONOSCOPY    . HYSTERECTOMY ABDOMINAL WITH SALPINGECTOMY Bilateral 09/12/2016   Procedure: HYSTERECTOMY ABDOMINAL WITH SALPINGECTOMY;  Surgeon: Catalina Antigua, MD;  Location: [facility];  Service: Gynecology;  Laterality: Bilateral;  . KNEE SURGERY Right   . POLYPECTOMY    . TUBAL LIGATION      There were no vitals filed for this visit.   Subjective Assessment - 11/01/20 0805    Subjective Pt arriving to therapy reporting her left knee is worse than her right. Pt reporting 0/10 pain in R knee and 10/10 on left knee. Pt is going tomorrow to see Dr. Ophelia Charter.    Pertinent History HTN, anemia, obesity    Limitations Standing;Walking    How long can you stand comfortably? <5 min    How long can you walk comfortably? improved after 3-5 min    Diagnostic tests surgery: known OA    Patient Stated Goals improve motion, strength, pain    Currently in Pain? Yes    Pain Score 10-Worst pain ever    Pain Location Knee    Pain Orientation Left    Pain Descriptors / Indicators Aching;Throbbing     Pain Type Acute pain    Pain Radiating Towards down back and side of knee that extends into calf    Pain Onset More than a month ago    Pain Frequency Constant                             [facility] Adult PT Treatment/Exercise - 11/01/20 0001      Exercises   Exercises Knee/Hip      Knee/Hip Exercises: Stretches   Active Hamstring Stretch Right;3 reps;30 seconds;Limitations    Active Hamstring Stretch Limitations seated    Gastroc Stretch 3 reps;30 seconds    Gastroc Stretch Limitations slant board      Knee/Hip Exercises: Aerobic   Nustep L4 x 6 minutes      Knee/Hip Exercises: Standing   Terminal Knee Extension Strengthening;Right;10 reps;Theraband    Theraband Level (Terminal Knee Extension) Level 3 (Green)      Knee/Hip Exercises: Seated   Long Arc Quad Strengthening;Right;10 reps;Limitations    Long Arc Quad Limitations holding 3 seconds      Knee/Hip Exercises: Supine   Quad Sets AROM;Strengthening;Right;20 reps;Limitations    Short Arc [employer] AROM;Strengthening;Right;2 sets;10 reps    Short  Arc [employer] Sets Limitations hodling 5 seconds    Bridges Strengthening;Both;2 sets;10 reps    Straight Leg Raises Strengthening;Right;2 sets;10 reps      Knee/Hip Exercises: Sidelying   Hip ABduction Strengthening;Right;2 sets;10 reps      Modalities   Modalities Vasopneumatic      Vasopneumatic   Number Minutes Vasopneumatic  10 minutes    Vasopnuematic Location  Knee    Vasopneumatic Pressure Low    Vasopneumatic Temperature  36      Manual Therapy   Manual Therapy Passive ROM    Manual therapy comments 5 minutes    Passive ROM knee flexion and extension, patella mobs                    PT Short Term Goals - 11/01/20 0809      PT SHORT TERM GOAL #1   Title independent with initial HEP    Status On-going      PT SHORT TERM GOAL #2   Title improve Rt knee ext to 0 deg for improved function    Status On-going             PT Long  Term Goals - 10/18/20 0946      PT LONG TERM GOAL #1   Title independent with final HEP    Status New    Target Date 11/29/20      PT LONG TERM GOAL #2   Title amb without deviations for improved function and mobility    Status New    Target Date 11/29/20      PT LONG TERM GOAL #3   Title report 50% improvement in pain in AM for improved function    Status New    Target Date 11/29/20      PT LONG TERM GOAL #4   Title FOTO score improved to 62 for improved function    Status New    Target Date 11/29/20      PT LONG TERM GOAL #5   Title demonstrate 5/5 Rt knee strength for improved function    Status New    Target Date 11/29/20                 Plan - 11/01/20 0808    Clinical Impression Statement Pt arriving to therapy today reporting compliance with her HEP. Pt also reporting her left knee is worse than her right. Pt is scheduled to see Dr. Lorin Mercy tomorrow for her new left knee pain. Pt tolerating exercises on R LE well progressing with TKA, strength, and ROM.    Personal Factors and Comorbidities Comorbidity 3+    Comorbidities HTN, anemia, obesity    Examination-Activity Limitations Bed Mobility;Squat;Stairs;Locomotion Level;Transfers    Examination-Participation Restrictions Occupation;Driving    Stability/Clinical Decision Making Evolving/Moderate complexity    Rehab Potential Good    PT Frequency 2x / week    PT Duration 6 weeks    PT Treatment/Interventions ADLs/Self Care Home Management;Cryotherapy;Electrical Stimulation;Moist Heat;Balance training;Therapeutic exercise;Therapeutic activities;Functional mobility training;Stair training;Gait training;Neuromuscular re-education;Patient/family education;Manual techniques;Vasopneumatic Device;Taping;Dry needling;Passive range of motion    PT Next Visit Plan review HEP, manual/exercises to work on [employer], modalities PRN for pain    PT Home Exercise Plan Access Code: [email or web address]    Consulted and Agree with Plan of Care  Patient           Patient will benefit from skilled therapeutic intervention in order to improve the following deficits and impairments:  Abnormal gait,Pain,Increased fascial restricitons,Increased edema,Decreased scar  mobility,Decreased activity tolerance,Decreased mobility,Decreased balance,Decreased range of motion,Difficulty walking,Impaired flexibility,Decreased strength  Visit Diagnosis: Acute pain of right knee  Stiffness of right knee, not elsewhere classified  Other abnormalities of gait and mobility  Muscle weakness (generalized)     Problem List Patient Active Problem List   Diagnosis Date Noted  . Elevated serum creatinine 08/30/2020  . Low serum high density lipoprotein (HDL) 08/30/2020  . Unilateral primary osteoarthritis, right knee 06/02/2020  . Acute medial meniscal tear 04/21/2020  . Knee locking, right 03/17/2020  . New onset tinnitus of left ear 01/29/2020  . Family history of diabetes mellitus in mother 08/22/2019  . Obesity (BMI 30-39.9) 08/21/2018  . Diverticula of colon 01/01/2018  . Colon polyps 01/01/2018  . S/P TAH (total abdominal hysterectomy) 09/12/2016  . Essential hypertension 05/12/2016  . Intraductal papilloma of left breast 05/06/2013    Oretha Caprice, PT , MPT 11/01/2020, 8:39 AM  [facility] Physical Therapy [address] [place], [place], [address] Phone: [phone]   Fax:  [phone]  Name: Mia Davis MRN: [id number] Date of Birth: Jan 25, 1966

## 2020-11-02 ENCOUNTER — Encounter: Payer: Self-pay | Admitting: Orthopaedic Surgery

## 2020-11-02 ENCOUNTER — Ambulatory Visit: Payer: Self-pay

## 2020-11-02 ENCOUNTER — Ambulatory Visit: Payer: BC Managed Care – PPO | Admitting: Orthopaedic Surgery

## 2020-11-02 VITALS — BP 128/85 | HR 69 | Ht 66.0 in | Wt 240.0 lb

## 2020-11-02 DIAGNOSIS — M25562 Pain in left knee: Secondary | ICD-10-CM | POA: Diagnosis not present

## 2020-11-02 MED ORDER — METHYLPREDNISOLONE ACETATE 40 MG/ML IJ SUSP
40.0000 mg | INTRAMUSCULAR | Status: AC | PRN
  Administered 2020-11-02: 40 mg via INTRA_ARTICULAR

## 2020-11-02 MED ORDER — BUPIVACAINE HCL 0.5 % IJ SOLN
3.0000 mL | INTRAMUSCULAR | Status: AC | PRN
Start: 2020-11-02 — End: 2020-11-02
  Administered 2020-11-02: 3 mL via INTRA_ARTICULAR

## 2020-11-02 MED ORDER — LIDOCAINE HCL 1 % IJ SOLN
0.5000 mL | INTRAMUSCULAR | Status: AC | PRN
  Administered 2020-11-02: .5 mL

## 2020-11-02 NOTE — Progress Notes (Addendum)
Office Visit Note   Patient: Mia Davis           Date of Birth: 14-Jun-1966           MRN: [id number] Visit Date: 11/02/2020              Requested by: Girtha Rm, NP-C [place],  [place] [address] PCP: Girtha Rm, NP-C   Assessment & Plan: Visit Diagnoses:  1. Acute pain of left knee     Plan: Knee injection performed.  We will recheck her in 4 weeks if she is having persistent problems will consider MRI imaging.  Follow-Up Instructions: No follow-ups on file.   Orders:  Orders Placed This Encounter  Procedures  . XR KNEE 3 VIEW LEFT   No orders of the defined types were placed in this encounter.     Procedures: Large Joint Inj: L knee on 11/02/2020 1:37 PM Indications: joint swelling and pain Details: 22 G 1.5 in needle, anterolateral approach  Arthrogram: No  Medications: 0.5 mL lidocaine 1 %; 3 mL bupivacaine 0.5 %; 40 mg methylPREDNISolone acetate 40 MG/ML Outcome: tolerated well, no immediate complications Procedure, treatment alternatives, risks and benefits explained, specific risks discussed. Consent was given by the patient. Immediately prior to procedure a time out was called to verify the correct patient, procedure, equipment, support staff and site/side marked as required. Patient was prepped and draped in the usual sterile fashion.       Clinical Data: No additional findings.   Subjective: Chief Complaint  Patient presents with  . Left Knee - Pain    HPI 55 year old female returns she had knee arthroscopy on the right knee last year and states around Christmas time she started having left knee pain now has trouble getting out full extension has been ambulatory with a limp.  Recent Doppler test was negative for DVT in the popliteal region.  She states some edema was noted but they did not use the term Baker's cyst.  She not had true locking but states even at night it is hard to even get up and go to the bathroom due  to the pain in her knee.  Pain is posterior and posterior medial. No response with tylenol or tramadol nor NSAIDS OTC.   Review of Systems updated unchanged since last year other than as mentioned above.   Objective: Vital Signs: BP 128/85   Pulse 69   Ht 5\' 6"  (1.676 m)   Wt 240 lb (108.9 kg)   LMP 07/13/2016   BMI 38.74 kg/m   Physical Exam Constitutional:      Appearance: She is well-developed.  HENT:     Head: Normocephalic.     Right Ear: External ear normal.     Left Ear: External ear normal.  Eyes:     Pupils: Pupils are equal, round, and reactive to light.  Neck:     Thyroid: No thyromegaly.     Trachea: No tracheal deviation.  Cardiovascular:     Rate and Rhythm: Normal rate.  Pulmonary:     Effort: Pulmonary effort is normal.  Abdominal:     Palpations: Abdomen is soft.  Skin:    General: Skin is warm and dry.  Neurological:     Mental Status: She is alert and oriented to person, place, and time.  Psychiatric:        Mood and Affect: Mood and affect normal.        Behavior: Behavior normal.  Ortho Exam patient has some posterior medial joint line tenderness collateral ligaments are stable.  She has difficulty with the last 5 degrees of full extension complains of pain no sciatic notch tenderness negative logroll to the hips.  Mild crepitus with knee flexion extension with patellofemoral loading.  No lateral joint line tenderness.  Patient is tender in the popliteal region no definite Baker's cyst palpable. Specialty Comments:  No specialty comments available.  Imaging: No results found.   PMFS History: Patient Active Problem List   Diagnosis Date Noted  . Elevated serum creatinine 08/30/2020  . Low serum high density lipoprotein (HDL) 08/30/2020  . Unilateral primary osteoarthritis, right knee 06/02/2020  . Acute medial meniscal tear 04/21/2020  . Knee locking, right 03/17/2020  . New onset tinnitus of left ear 01/29/2020  . Family history of  diabetes mellitus in mother 08/22/2019  . Obesity (BMI 30-39.9) 08/21/2018  . Diverticula of colon 01/01/2018  . Colon polyps 01/01/2018  . S/P TAH (total abdominal hysterectomy) 09/12/2016  . Essential hypertension 05/12/2016  . Intraductal papilloma of left breast 05/06/2013   Past Medical History:  Diagnosis Date  . Anemia   . Colon polyps 01/01/2018  . Diverticula of colon 01/01/2018  . Hypertension     Family History  Problem Relation Age of Onset  . Diabetes Mother   . Hypertension Mother   . Diabetes Father   . Hypertension Father   . Heart attack Father 29  . Cancer Maternal Grandmother        pancreatic  . Diabetes Sister   . Hypertension Sister   . Colon cancer Neg Hx   . Colon polyps Neg Hx   . Esophageal cancer Neg Hx   . Stomach cancer Neg Hx   . Rectal cancer Neg Hx     Past Surgical History:  Procedure Laterality Date  . ABDOMINAL HYSTERECTOMY  2017   abdominal   . COLONOSCOPY    . HYSTERECTOMY ABDOMINAL WITH SALPINGECTOMY Bilateral 09/12/2016   Procedure: HYSTERECTOMY ABDOMINAL WITH SALPINGECTOMY;  Surgeon: Mora Bellman, MD;  Location: [facility];  Service: Gynecology;  Laterality: Bilateral;  . KNEE SURGERY Right   . POLYPECTOMY    . TUBAL LIGATION     Social History   Occupational History  . Not on file  Tobacco Use  . Smoking status: Never Smoker  . Smokeless tobacco: Never Used  Vaping Use  . Vaping Use: Never used  Substance and Sexual Activity  . Alcohol use: No  . Drug use: No  . Sexual activity: Not Currently    Birth control/protection: Surgical    Comment: 1st intercourse-11, partners- 5,

## 2020-11-04 ENCOUNTER — Encounter: Payer: Self-pay | Admitting: Obstetrics & Gynecology

## 2020-11-04 ENCOUNTER — Other Ambulatory Visit: Payer: Self-pay

## 2020-11-04 ENCOUNTER — Ambulatory Visit: Payer: BC Managed Care – PPO | Admitting: Obstetrics & Gynecology

## 2020-11-04 VITALS — BP 120/72 | HR 82 | Resp 20 | Ht 65.16 in | Wt 242.0 lb

## 2020-11-04 DIAGNOSIS — Z90710 Acquired absence of both cervix and uterus: Secondary | ICD-10-CM

## 2020-11-04 DIAGNOSIS — N816 Rectocele: Secondary | ICD-10-CM | POA: Diagnosis not present

## 2020-11-04 DIAGNOSIS — Z780 Asymptomatic menopausal state: Secondary | ICD-10-CM | POA: Diagnosis not present

## 2020-11-04 DIAGNOSIS — Z01419 Encounter for gynecological examination (general) (routine) without abnormal findings: Secondary | ICD-10-CM

## 2020-11-04 DIAGNOSIS — Z6841 Body Mass Index (BMI) 40.0 and over, adult: Secondary | ICD-10-CM

## 2020-11-04 NOTE — Progress Notes (Signed)
Mia Davis 01/11/1966 [id number]   History:    55 y.o. G2P2L2 Longterm boyfriend.  Twins in their 45's.  Youngest son died at 51 yo.  RP:  Established patient presenting for annual gyn exam   HPI: S/P TAH/Bilateral Salpingectomy in 2017 for Fibroids.  No pelvic pain.  No hot flushes/night sweats.  No pain with IC.  Urine normal. BMs constipation.  Feels a bulge in the vagina when pushing for stools.  Breasts normal.  BMI 40.08.  Not physically active, planning to be. Just started Weight Watchers.  Health Labs with Fam MD.  On Losartan for cHTN.  Past medical history,surgical history, family history and social history were all reviewed and documented in the EPIC chart.  Gynecologic History Patient's last menstrual period was 07/13/2016.  Obstetric History OB History  Gravida Para Term Preterm AB Living  2 2       3   SAB IAB Ectopic Multiple Live Births        1      # Outcome Date GA Lbr Len/2nd Weight Sex Delivery Anes PTL Lv  2 Para           1 Para              ROS: A ROS was performed and pertinent positives and negatives are included in the history.  GENERAL: No fevers or chills. HEENT: No change in vision, no earache, sore throat or sinus congestion. NECK: No pain or stiffness. CARDIOVASCULAR: No chest pain or pressure. No palpitations. PULMONARY: No shortness of breath, cough or wheeze. GASTROINTESTINAL: No abdominal pain, nausea, vomiting or diarrhea, melena or bright red blood per rectum. GENITOURINARY: No urinary frequency, urgency, hesitancy or dysuria. MUSCULOSKELETAL: No joint or muscle pain, no back pain, no recent trauma. DERMATOLOGIC: No rash, no itching, no lesions. ENDOCRINE: No polyuria, polydipsia, no heat or cold intolerance. No recent change in weight. HEMATOLOGICAL: No anemia or easy bruising or bleeding. NEUROLOGIC: No headache, seizures, numbness, tingling or weakness. PSYCHIATRIC: No depression, no loss of interest in normal activity or change in sleep  pattern.     Exam:   BP 120/72 (BP Location: Left Arm, Patient Position: Sitting)   Pulse 82   Resp 20   Ht 5' 5.16" (1.655 m)   Wt 242 lb (109.8 kg)   LMP 07/13/2016   BMI 40.08 kg/m   Body mass index is 40.08 kg/m.  General appearance : Well developed well nourished female. No acute distress HEENT: Eyes: no retinal hemorrhage or exudates,  Neck supple, trachea midline, no carotid bruits, no thyroidmegaly Lungs: Clear to auscultation, no rhonchi or wheezes, or rib retractions  Heart: Regular rate and rhythm, no murmurs or gallops Breast:Examined in sitting and supine position were symmetrical in appearance, no palpable masses or tenderness,  no skin retraction, no nipple inversion, no nipple discharge, no skin discoloration, no axillary or supraclavicular lymphadenopathy Abdomen: no palpable masses or tenderness, no rebound or guarding Extremities: no edema or skin discoloration or tenderness  Pelvic: Vulva: Normal             Vagina: No gross lesions or discharge.  Rectocele grade 2/3.  Cervix/Uterus absent  Adnexa  Without masses or tenderness  Anus: Normal   Assessment/Plan:  55 y.o. female for annual exam   1. Well female exam with routine gynecological exam Gynecologic exam status post TAH.  No indication for Pap test this year.  Last Pap test January 2021 was negative.  Breast exam normal.  Screening mammogram December 2021 was negative.  Colonoscopy when due through her family NP.  Health labs with family NP.  2. S/P TAH (total abdominal hysterectomy)  3. Postmenopause Well on no hormone replacement therapy.  Vitamin D supplements, calcium intake of 1500 mg daily and regular weightbearing physical activity is recommended.  4. Baden-Walker grade 2 rectocele Importance of treating constipation reviewed.  Avoid pelvic pressure to prevent worsening of the rectocele. 5. Class 3 severe obesity due to excess calories without serious comorbidity with body mass index (BMI)  of 40.0 to 44.9 in adult [facility])  Princess Bruins MD, 2:54 PM 11/04/2020

## 2020-11-06 ENCOUNTER — Encounter: Payer: Self-pay | Admitting: Obstetrics & Gynecology

## 2020-11-07 ENCOUNTER — Encounter: Payer: Self-pay | Admitting: Physical Therapy

## 2020-11-08 ENCOUNTER — Encounter: Payer: BC Managed Care – PPO | Admitting: Physical Therapy

## 2020-11-12 ENCOUNTER — Encounter: Payer: Self-pay | Admitting: Family Medicine

## 2020-11-12 ENCOUNTER — Other Ambulatory Visit: Payer: Self-pay

## 2020-11-12 ENCOUNTER — Encounter: Payer: Self-pay | Admitting: Physical Therapy

## 2020-11-12 ENCOUNTER — Ambulatory Visit (INDEPENDENT_AMBULATORY_CARE_PROVIDER_SITE_OTHER): Payer: BC Managed Care – PPO | Admitting: Physical Therapy

## 2020-11-12 DIAGNOSIS — M25661 Stiffness of right knee, not elsewhere classified: Secondary | ICD-10-CM

## 2020-11-12 DIAGNOSIS — I10 Essential (primary) hypertension: Secondary | ICD-10-CM

## 2020-11-12 DIAGNOSIS — M6281 Muscle weakness (generalized): Secondary | ICD-10-CM | POA: Diagnosis not present

## 2020-11-12 DIAGNOSIS — R2689 Other abnormalities of gait and mobility: Secondary | ICD-10-CM | POA: Diagnosis not present

## 2020-11-12 DIAGNOSIS — M25561 Pain in right knee: Secondary | ICD-10-CM | POA: Diagnosis not present

## 2020-11-12 MED ORDER — LOSARTAN POTASSIUM 100 MG PO TABS: ORAL_TABLET | ORAL | 1 refills | Status: DC

## 2020-11-12 NOTE — Therapy (Addendum)
[facility] [address] [place], [place], [address] Phone: [phone]   Fax:  [phone]  Physical Therapy Treatment/Discharge Summary  Patient Details  Name: Mia Davis MRN: [id number] Date of Birth: [birth year] Referring Provider (PT): Marybelle Killings, MD   Encounter Date: 11/12/2020   PT End of Session - 11/12/20 0924    Visit Number 3    Number of Visits 12    Date for PT Re-Evaluation 11/29/20    Authorization Type [employer] 30 visit limit    PT Start Time 0758    PT Stop Time 0848   cold pack   PT Time Calculation (min) 50 min    Activity Tolerance Patient tolerated treatment well    Behavior During Therapy [facility] for tasks assessed/performed           Past Medical History:  Diagnosis Date  . Anemia   . Colon polyps 01/01/2018  . Diverticula of colon 01/01/2018  . Hypertension     Past Surgical History:  Procedure Laterality Date  . ABDOMINAL HYSTERECTOMY  2017   abdominal   . COLONOSCOPY    . HYSTERECTOMY ABDOMINAL WITH SALPINGECTOMY Bilateral 09/12/2016   Procedure: HYSTERECTOMY ABDOMINAL WITH SALPINGECTOMY;  Surgeon: Mora Bellman, MD;  Location: [facility];  Service: Gynecology;  Laterality: Bilateral;  . KNEE SURGERY Right   . POLYPECTOMY    . TUBAL LIGATION      There were no vitals filed for this visit.   Subjective Assessment - 11/12/20 0800    Subjective Lt knee is giving her more trouble than the Rt.; injection lasted about 1 day    Pertinent History HTN, anemia, obesity    Limitations Standing;Walking    How long can you stand comfortably? <5 min    How long can you walk comfortably? improved after 3-5 min    Diagnostic tests surgery: known OA    Patient Stated Goals improve motion, strength, pain    Currently in Pain? Yes    Pain Score 2     Pain Location Knee    Pain Orientation Right    Pain Descriptors / Indicators Aching;Throbbing    Pain Type Chronic pain    Pain Onset More than a month ago    Pain  Frequency Constant    Aggravating Factors  standing, walking, worse in AM, stairs    Pain Relieving Factors sitting, rest    Multiple Pain Sites Yes    Pain Score 9    Pain Location Knee    Pain Orientation Left    Pain Descriptors / Indicators Sore;Aching;Sharp    Pain Type Acute pain    Pain Onset More than a month ago    Pain Frequency Constant    Aggravating Factors  constant              [facility] PT Assessment - 11/12/20 0925      Assessment   Medical Diagnosis M17.11 (ICD-10-CM) - Unilateral primary osteoarthritis, right knee; S83.241D (ICD-10-CM) - Acute medial meniscus tear of right knee, subsequent encounter    Referring Provider (PT) Marybelle Killings, MD      AROM   Right Knee Extension 0                         [facility] Adult PT Treatment/Exercise - 11/12/20 0804      Knee/Hip Exercises: Stretches   Active Hamstring Stretch Both;3 reps;30 seconds    Active Hamstring Stretch Limitations seated  Gastroc Stretch 3 reps;30 seconds    Gastroc Stretch Limitations slant board    Soleus Stretch Both;3 reps;30 seconds    Soleus Stretch Limitations slantboard      Knee/Hip Exercises: Aerobic   Nustep L6 x 6 minutes      Knee/Hip Exercises: Seated   Other Seated Knee/Hip Exercises seated SLR x 10 reps bil with 3 sec hold      Knee/Hip Exercises: Supine   Short Arc Quad Sets AROM;Strengthening;Right;2 sets;10 reps    Short Arc Quad Sets Limitations 5 sec hold; 2#    Bridges Strengthening;Both;2 sets;10 reps      Knee/Hip Exercises: Sidelying   Hip ABduction Strengthening;Right;2 sets;10 reps    Hip ABduction Limitations 2#      Modalities   Modalities Cryotherapy      Cryotherapy   Number Minutes Cryotherapy 10 Minutes    Cryotherapy Location Knee   bil   Type of Cryotherapy Ice pack                  PT Education - 11/12/20 0924    Education Details added calf stretch    Person(s) Educated Patient    Methods  Explanation;Demonstration;Handout    Comprehension Verbalized understanding;Returned demonstration            PT Short Term Goals - 11/12/20 0925      PT SHORT TERM GOAL #1   Title independent with initial HEP    Status Achieved      PT SHORT TERM GOAL #2   Title improve Rt knee ext to 0 deg for improved function    Status Achieved             PT Long Term Goals - 11/12/20 2130      PT LONG TERM GOAL #1   Title independent with final HEP    Status On-going    Target Date 11/29/20      PT LONG TERM GOAL #2   Title amb without deviations for improved function and mobility    Status On-going      PT LONG TERM GOAL #3   Title report 50% improvement in pain in AM for improved function    Status On-going      PT LONG TERM GOAL #4   Title FOTO score improved to 62 for improved function    Status On-going      PT LONG TERM GOAL #5   Title demonstrate 5/5 Rt knee strength for improved function    Status On-going                 Plan - 11/12/20 0925    Clinical Impression Statement Pt has met STGs and is progressing well with PT with Rt knee.  Her Lt knee is continuing to give her trouble and reports injection was only beneficial for a day.  Will continue to benefit from PT to maximize function.  Did recommend she reach out to Dr. Lorin Mercy if pain continues to get worse in Lt knee.    Personal Factors and Comorbidities Comorbidity 3+    Comorbidities HTN, anemia, obesity    Examination-Activity Limitations Bed Mobility;Squat;Stairs;Locomotion Level;Transfers    Examination-Participation Restrictions Occupation;Driving    Stability/Clinical Decision Making Evolving/Moderate complexity    Rehab Potential Good    PT Frequency 2x / week    PT Duration 6 weeks    PT Treatment/Interventions ADLs/Self Care Home Management;Cryotherapy;Electrical Stimulation;Moist Heat;Balance training;Therapeutic exercise;Therapeutic activities;Functional mobility training;Stair  training;Gait training;Neuromuscular re-education;Patient/family education;Manual  techniques;Vasopneumatic Device;Taping;Dry needling;Passive range of motion    PT Next Visit Plan manual/exercises to work on [employer], modalities PRN for pain    PT Home Exercise Plan Access Code: [email or web address]    Consulted and Agree with Plan of Care Patient           Patient will benefit from skilled therapeutic intervention in order to improve the following deficits and impairments:  Abnormal gait,Pain,Increased fascial restricitons,Increased edema,Decreased scar mobility,Decreased activity tolerance,Decreased mobility,Decreased balance,Decreased range of motion,Difficulty walking,Impaired flexibility,Decreased strength  Visit Diagnosis: Acute pain of right knee  Stiffness of right knee, not elsewhere classified  Other abnormalities of gait and mobility  Muscle weakness (generalized)     Problem List Patient Active Problem List   Diagnosis Date Noted  . Elevated serum creatinine 08/30/2020  . Low serum high density lipoprotein (HDL) 08/30/2020  . Unilateral primary osteoarthritis, right knee 06/02/2020  . Acute medial meniscal tear 04/21/2020  . Knee locking, right 03/17/2020  . New onset tinnitus of left ear 01/29/2020  . Family history of diabetes mellitus in mother 08/22/2019  . Obesity (BMI 30-39.9) 08/21/2018  . Diverticula of colon 01/01/2018  . Colon polyps 01/01/2018  . S/P TAH (total abdominal hysterectomy) 09/12/2016  . Essential hypertension 05/12/2016  . Intraductal papilloma of left breast 05/06/2013       Laureen Abrahams, PT, DPT 11/12/20 9:27 AM    [facility] [address] [place], [place], [address] Phone: [phone]   Fax:  [phone]  Name: Mia Davis MRN: [id number] Date of Birth: 11-Sep-1966     PHYSICAL THERAPY DISCHARGE SUMMARY  Visits from Start of Care: 3  Current functional level related to goals /  functional outcomes: See above   Remaining deficits: unknown   Education / Equipment: HEP  Plan: Patient agrees to discharge.  Patient goals were not met. Patient is being discharged due to not returning since the last visit.  ?????     Laureen Abrahams, PT, DPT 01/12/21 8:31 AM  [facility] [address] [place], [place], [address] Phone: [phone]   Fax:  [phone]

## 2020-11-12 NOTE — Patient Instructions (Signed)
Access Code: [email or web address] URL: [email or web address] Date: 11/12/2020 Prepared by: Faustino Congress  Exercises Supine Quad Set - 3 x daily - 7 x weekly - 3 sets - 10 reps Seated Straight Leg Raise - 3 x daily - 7 x weekly - 3 sets - 10 reps Seated Hamstring Stretch - 2 x daily - 7 x weekly - 3 reps - 1 sets - 30 sec hold Seated Long Arc Quad - 2 x daily - 7 x weekly - 3 sets - 10 reps - 5 sec hold Standing Bilateral Gastroc Stretch with Step - 2 x daily - 7 x weekly - 1 sets - 3 reps - 30 sec hold

## 2020-11-26 ENCOUNTER — Encounter: Payer: BC Managed Care – PPO | Admitting: Physical Therapy

## 2020-11-30 ENCOUNTER — Encounter: Payer: Self-pay | Admitting: Orthopaedic Surgery

## 2020-11-30 ENCOUNTER — Ambulatory Visit: Payer: BC Managed Care – PPO | Admitting: Orthopaedic Surgery

## 2020-11-30 VITALS — BP 127/86 | HR 60 | Ht 66.0 in | Wt 233.0 lb

## 2020-11-30 DIAGNOSIS — S83242D Other tear of medial meniscus, current injury, left knee, subsequent encounter: Secondary | ICD-10-CM | POA: Diagnosis not present

## 2020-11-30 NOTE — Addendum Note (Signed)
Addended by: Meyer Cory on: 11/30/2020 02:43 PM   Modules accepted: Orders

## 2020-11-30 NOTE — Progress Notes (Signed)
Office Visit Note   Patient: Mia Davis           Date of Birth: 12-11-1965           MRN: [id number] Visit Date: 11/30/2020              Requested by: Girtha Rm, NP-C [place],  [place] [address] PCP: Girtha Rm, NP-C   Assessment & Plan: Visit Diagnoses:  1. Acute medial meniscus tear of left knee, subsequent encounter     Plan: Patient symptoms are consistent with a medial and posterior medial meniscal tear with Baker's cyst.  She is having trouble with community ambulation would like proceed with MRI scan.  We discussed she likely would require knee arthroscopy for posterior medial meniscal tear.  Office follow-up after scan for review.  Follow-Up Instructions: Return after MRI scan.  Orders:  No orders of the defined types were placed in this encounter.  No orders of the defined types were placed in this encounter.     Procedures: No procedures performed   Clinical Data: No additional findings.   Subjective: Chief Complaint  Patient presents with  . Left Knee - Follow-up, Pain    HPI 55 year old female returns with ongoing problems with left knee pain.  She has pain medially and posterior medial.  When she is on it more she has swelling posteriorly in the popliteal region.  She walks with her knee slightly bent.  Previous injection in January gave her excellent relief only for 24 hours then recurrence of symptoms.  She has had previous knee arthroscopy opposite right knee which showed posterior medial meniscal tear and Baker's cyst with some medial compartment arthrosis.  Right knee is doing well post partial meniscectomy with good range of motion and no pain.  Left knee is getting progressively worse she uses a handicap spot when she parks.  She has had to take ibuprofen regularly.  Also taken some tramadol and hydrocodone and states even those did not help the left knee pain.  She is going to weight watchers and has lost some weight  and is down to 233.  Review of Systems 14 point system update, previous knee arthroscopy opposite right knee doing well.  Follows for obesity previous breast biopsy.  All the systems noncontributory to HPI.   Objective: Vital Signs: BP 127/86   Pulse 60   Ht 5\' 6"  (1.676 m)   Wt 233 lb (105.7 kg)   LMP 07/13/2016   BMI 37.61 kg/m   Physical Exam Constitutional:      Appearance: She is well-developed.  HENT:     Head: Normocephalic.     Right Ear: External ear normal.     Left Ear: External ear normal.  Eyes:     Pupils: Pupils are equal, round, and reactive to light.  Neck:     Thyroid: No thyromegaly.     Trachea: No tracheal deviation.  Cardiovascular:     Rate and Rhythm: Normal rate.  Pulmonary:     Effort: Pulmonary effort is normal.  Abdominal:     Palpations: Abdomen is soft.  Skin:    General: Skin is warm and dry.  Neurological:     Mental Status: She is alert and oriented to person, place, and time.  Psychiatric:        Mood and Affect: Mood and affect normal.        Behavior: Behavior normal.     Ortho Exam left knee  medial joint line tenderness negative logroll of the hips.  10degree to 110 degrees range of motion left knee pain with attempts at further extension.  Medial joint line tenderness posterior medial joint line tenderness no lateral joint line tenderness.  Lachman negative ACL PCL test normal.  Moderate size left knee Baker's cyst.  .  Specialty Comments:  No specialty comments available.  Imaging: No results found.   PMFS History: Patient Active Problem List   Diagnosis Date Noted  . Elevated serum creatinine 08/30/2020  . Low serum high density lipoprotein (HDL) 08/30/2020  . Unilateral primary osteoarthritis, right knee 06/02/2020  . Acute medial meniscal tear 04/21/2020  . Knee locking, right 03/17/2020  . New onset tinnitus of left ear 01/29/2020  . Family history of diabetes mellitus in mother 08/22/2019  . Obesity (BMI 30-39.9)  08/21/2018  . Diverticula of colon 01/01/2018  . Colon polyps 01/01/2018  . S/P TAH (total abdominal hysterectomy) 09/12/2016  . Essential hypertension 05/12/2016  . Intraductal papilloma of left breast 05/06/2013   Past Medical History:  Diagnosis Date  . Anemia   . Colon polyps 01/01/2018  . Diverticula of colon 01/01/2018  . Hypertension     Family History  Problem Relation Age of Onset  . Diabetes Mother   . Hypertension Mother   . Diabetes Father   . Hypertension Father   . Heart attack Father 31  . Cancer Maternal Grandmother        pancreatic  . Diabetes Sister   . Hypertension Sister   . Colon cancer Neg Hx   . Colon polyps Neg Hx   . Esophageal cancer Neg Hx   . Stomach cancer Neg Hx   . Rectal cancer Neg Hx     Past Surgical History:  Procedure Laterality Date  . ABDOMINAL HYSTERECTOMY  2017   abdominal   . COLONOSCOPY    . HYSTERECTOMY ABDOMINAL WITH SALPINGECTOMY Bilateral 09/12/2016   Procedure: HYSTERECTOMY ABDOMINAL WITH SALPINGECTOMY;  Surgeon: Mora Bellman, MD;  Location: [facility];  Service: Gynecology;  Laterality: Bilateral;  . KNEE SURGERY Right   . POLYPECTOMY    . TUBAL LIGATION     Social History   Occupational History  . Not on file  Tobacco Use  . Smoking status: Never Smoker  . Smokeless tobacco: Never Used  Vaping Use  . Vaping Use: Never used  Substance and Sexual Activity  . Alcohol use: No  . Drug use: No  . Sexual activity: Not Currently    Birth control/protection: Surgical    Comment: 1st intercourse-11, partners- 5,

## 2020-12-11 ENCOUNTER — Other Ambulatory Visit: Payer: Self-pay | Admitting: Family Medicine

## 2020-12-11 DIAGNOSIS — I10 Essential (primary) hypertension: Secondary | ICD-10-CM

## 2020-12-16 ENCOUNTER — Telehealth: Payer: Self-pay | Admitting: Orthopaedic Surgery

## 2020-12-16 NOTE — Telephone Encounter (Signed)
Called patient left voicemail message to return call to schedule an appointment for MRI review with Dr. Lorin Mercy. MRI is scheduled for 12/20/2020

## 2020-12-20 ENCOUNTER — Ambulatory Visit
Admission: RE | Admit: 2020-12-20 | Discharge: 2020-12-20 | Disposition: A | Discharge status: Home / Self Care | Payer: BC Managed Care – PPO | Source: Ambulatory Visit | Attending: Orthopaedic Surgery | Admitting: Orthopaedic Surgery

## 2020-12-20 ENCOUNTER — Other Ambulatory Visit: Payer: Self-pay

## 2020-12-20 DIAGNOSIS — S83242A Other tear of medial meniscus, current injury, left knee, initial encounter: Secondary | ICD-10-CM | POA: Diagnosis not present

## 2020-12-20 DIAGNOSIS — S83242D Other tear of medial meniscus, current injury, left knee, subsequent encounter: Secondary | ICD-10-CM

## 2020-12-20 DIAGNOSIS — M1712 Unilateral primary osteoarthritis, left knee: Secondary | ICD-10-CM | POA: Diagnosis not present

## 2020-12-20 DIAGNOSIS — M25462 Effusion, left knee: Secondary | ICD-10-CM | POA: Diagnosis not present

## 2020-12-20 DIAGNOSIS — M7122 Synovial cyst of popliteal space [Baker], left knee: Secondary | ICD-10-CM | POA: Diagnosis not present

## 2020-12-28 ENCOUNTER — Encounter: Payer: Self-pay | Admitting: Orthopaedic Surgery

## 2020-12-29 ENCOUNTER — Ambulatory Visit: Payer: BC Managed Care – PPO | Admitting: Orthopaedic Surgery

## 2021-01-04 ENCOUNTER — Ambulatory Visit: Payer: BC Managed Care – PPO | Admitting: Orthopaedic Surgery

## 2021-01-14 ENCOUNTER — Encounter: Payer: Self-pay | Admitting: Internal Medicine

## 2021-03-11 ENCOUNTER — Ambulatory Visit: Payer: BC Managed Care – PPO | Admitting: Family Medicine

## 2021-03-11 ENCOUNTER — Encounter: Payer: Self-pay | Admitting: Family Medicine

## 2021-03-11 ENCOUNTER — Other Ambulatory Visit: Payer: Self-pay

## 2021-03-11 VITALS — BP 120/74 | HR 67 | Ht 66.25 in | Wt 222.0 lb

## 2021-03-11 DIAGNOSIS — K635 Polyp of colon: Secondary | ICD-10-CM

## 2021-03-11 DIAGNOSIS — R7989 Other specified abnormal findings of blood chemistry: Secondary | ICD-10-CM | POA: Diagnosis not present

## 2021-03-11 DIAGNOSIS — Z1211 Encounter for screening for malignant neoplasm of colon: Secondary | ICD-10-CM

## 2021-03-11 DIAGNOSIS — I10 Essential (primary) hypertension: Secondary | ICD-10-CM | POA: Diagnosis not present

## 2021-03-11 DIAGNOSIS — Z20822 Contact with and (suspected) exposure to covid-19: Secondary | ICD-10-CM | POA: Diagnosis not present

## 2021-03-11 DIAGNOSIS — E669 Obesity, unspecified: Secondary | ICD-10-CM

## 2021-03-11 NOTE — Patient Instructions (Signed)
Keep up the good work!  I will be in touch with your lab results

## 2021-03-11 NOTE — Progress Notes (Signed)
   Subjective:    Patient ID: Mia Davis, female    DOB: 1965-10-24, 55 y.o.   MRN: [id number]  HPI Chief Complaint  Patient presents with  . med check.    Med check . No concerns   She is here to follow up on HTN.  She does not check blood pressure at home.  States she has been doing [employer] and has lost weight.  Documented 11 pounds here in the office. States she is feeling good.  The weight loss has improved her knee pain. She is hopeful that she can come off of her blood pressure medication 1 day.  States she is overdue for her 3-year colonoscopy recall and has not heard from her gastroenterologist.  I will refer her back to Dr. Havery Moros.  No other concerns or questions.  Denies fever, chills, dizziness, chest pain, palpitations, shortness of breath, abdominal pain, N/V/D, urinary symptoms, LE edema.     Review of Systems Pertinent positives and negatives in the history of present illness.     Objective:   Physical Exam BP 120/74   Pulse 67   Ht 5' 6.25" (1.683 m)   Wt 222 lb (100.7 kg)   LMP 07/13/2016   BMI 35.56 kg/m   Alert and oriented and in no acute distress.  Respirations unlabored.  Extremities without edema.  Normal speech, mood.      Assessment & Plan:  Essential hypertension - Plan: Basic metabolic panel, CBC with Differential/Platelet  Elevated serum creatinine - Plan: Basic metabolic panel  Polyp of colon, unspecified part of colon, unspecified type - Plan: Ambulatory referral to Gastroenterology  Screen for colon cancer - Plan: Ambulatory referral to Gastroenterology  Obesity (BMI 30-39.9)  Mia Davis is here today for follow-up on hypertension.  Blood pressure well controlled.  Congratulated her on 11 pound weight loss and doing weight watchers.  Encouraged her to keep up the good work.  Mild kidney function elevation which we will recheck today.  Encouraged low-sodium and healthy diet and continuing blood pressure medication.   Discussed that if she continues with weight loss that we may be able to reduce her blood pressure medication.  Encouraged her to start checking her blood pressure at home. I will refer her to GI per patient request due to being overdue for her 3-year recall. She will follow-up here for fasting CPE in 6 months or sooner if needed.

## 2021-03-12 LAB — CBC WITH DIFFERENTIAL/PLATELET
Basophils Absolute: 0 10*3/uL (ref 0.0–0.2)
Basos: 0 %
EOS (ABSOLUTE): 0.3 10*3/uL (ref 0.0–0.4)
Eos: 5 %
Hematocrit: 40.9 % (ref 34.0–46.6)
Hemoglobin: 13.7 g/dL (ref 11.1–15.9)
Immature Grans (Abs): 0 10*3/uL (ref 0.0–0.1)
Immature Granulocytes: 0 %
Lymphocytes Absolute: 2.4 10*3/uL (ref 0.7–3.1)
Lymphs: 40 %
MCH: 26.3 pg — ABNORMAL LOW (ref 26.6–33.0)
MCHC: 33.5 g/dL (ref 31.5–35.7)
MCV: 79 fL (ref 79–97)
Monocytes Absolute: 0.7 10*3/uL (ref 0.1–0.9)
Monocytes: 11 %
Neutrophils Absolute: 2.7 10*3/uL (ref 1.4–7.0)
Neutrophils: 44 %
Platelets: 261 10*3/uL (ref 150–450)
RBC: 5.21 x10E6/uL (ref 3.77–5.28)
RDW: 14.8 % (ref 11.7–15.4)
WBC: 6 10*3/uL (ref 3.4–10.8)

## 2021-03-12 LAB — BASIC METABOLIC PANEL
BUN/Creatinine Ratio: 10 (ref 9–23)
BUN: 11 mg/dL (ref 6–24)
CO2: 21 mmol/L (ref 20–29)
Calcium: 8.8 mg/dL (ref 8.7–10.2)
Chloride: 107 mmol/L — ABNORMAL HIGH (ref 96–106)
Creatinine, Ser: 1.1 mg/dL — ABNORMAL HIGH (ref 0.57–1.00)
Glucose: 92 mg/dL (ref 65–99)
Potassium: 4.5 mmol/L (ref 3.5–5.2)
Sodium: 141 mmol/L (ref 134–144)
eGFR: 59 mL/min/{1.73_m2} — ABNORMAL LOW (ref >59)

## 2021-03-13 ENCOUNTER — Encounter: Payer: Self-pay | Admitting: Family Medicine

## 2021-03-22 ENCOUNTER — Encounter: Payer: Self-pay | Admitting: Gastroenterology

## 2021-03-22 ENCOUNTER — Encounter: Payer: Self-pay | Admitting: Family Medicine

## 2021-04-04 ENCOUNTER — Other Ambulatory Visit: Payer: Self-pay | Admitting: Family Medicine

## 2021-04-04 DIAGNOSIS — I10 Essential (primary) hypertension: Secondary | ICD-10-CM

## 2021-05-19 ENCOUNTER — Other Ambulatory Visit: Payer: Self-pay

## 2021-05-19 DIAGNOSIS — I10 Essential (primary) hypertension: Secondary | ICD-10-CM

## 2021-05-19 MED ORDER — LOSARTAN POTASSIUM 100 MG PO TABS: ORAL_TABLET | ORAL | 1 refills | Status: DC

## 2021-06-07 ENCOUNTER — Other Ambulatory Visit: Payer: Self-pay

## 2021-06-07 ENCOUNTER — Ambulatory Visit (AMBULATORY_SURGERY_CENTER): Payer: Self-pay | Admitting: *Deleted

## 2021-06-07 VITALS — Ht 66.0 in | Wt 218.0 lb

## 2021-06-07 DIAGNOSIS — Z86010 Personal history of colonic polyps: Secondary | ICD-10-CM

## 2021-06-07 NOTE — Progress Notes (Signed)

## 2021-06-15 ENCOUNTER — Encounter: Payer: Self-pay | Admitting: Gastroenterology

## 2021-06-21 ENCOUNTER — Encounter: Payer: Self-pay | Admitting: Gastroenterology

## 2021-06-21 ENCOUNTER — Other Ambulatory Visit: Payer: Self-pay

## 2021-06-21 ENCOUNTER — Ambulatory Visit (AMBULATORY_SURGERY_CENTER): Payer: BC Managed Care – PPO | Admitting: Gastroenterology

## 2021-06-21 VITALS — BP 120/75 | HR 57 | Temp 97.5°F | Resp 17 | Ht 66.0 in | Wt 218.0 lb

## 2021-06-21 DIAGNOSIS — D120 Benign neoplasm of cecum: Secondary | ICD-10-CM

## 2021-06-21 DIAGNOSIS — Z1211 Encounter for screening for malignant neoplasm of colon: Secondary | ICD-10-CM | POA: Diagnosis not present

## 2021-06-21 DIAGNOSIS — D122 Benign neoplasm of ascending colon: Secondary | ICD-10-CM

## 2021-06-21 DIAGNOSIS — Z86010 Personal history of colonic polyps: Secondary | ICD-10-CM | POA: Diagnosis not present

## 2021-06-21 MED ORDER — SODIUM CHLORIDE 0.9 % IV SOLN: 500.0000 mL | Freq: Once | INTRAVENOUS | Status: DC

## 2021-06-21 NOTE — Progress Notes (Signed)
A and O x3. Report to RN. Tolerated MAC anesthesia well. 

## 2021-06-21 NOTE — Progress Notes (Signed)
[facility] History and Physical   Primary Care Physician:  Girtha Rm, NP-C   Reason for Procedure:   History of colon polyps  Plan:    colonoscopy     HPI: Mia Davis is a 55 y.o. female  here for colonoscopy surveillance - history of advanced adenoma 12/2017. Patient denies any bowel symptoms at this time. No family history of colon cancer known. Otherwise feels well without any cardiopulmonary symptoms.    Past Medical History:  Diagnosis Date   Anemia    Colon polyps 01/01/2018   Diverticula of colon 01/01/2018   Hypertension     Past Surgical History:  Procedure Laterality Date   ABDOMINAL HYSTERECTOMY  2017   abdominal    COLONOSCOPY     HYSTERECTOMY ABDOMINAL WITH SALPINGECTOMY Bilateral 09/12/2016   Procedure: HYSTERECTOMY ABDOMINAL WITH SALPINGECTOMY;  Surgeon: Mora Bellman, MD;  Location: [facility];  Service: Gynecology;  Laterality: Bilateral;   KNEE SURGERY Right    POLYPECTOMY     TUBAL LIGATION      Prior to Admission medications   Medication Sig Start Date End Date Taking? Authorizing Provider  amLODipine (NORVASC) 10 MG tablet TAKE 1 TABLET BY MOUTH DAILY 04/04/21  Yes Henson, Vickie L, NP-C  losartan (COZAAR) 100 MG tablet Take 1 tablet daily 05/19/21  Yes Henson, Vickie L, NP-C    Current Outpatient Medications  Medication Sig Dispense Refill   amLODipine (NORVASC) 10 MG tablet TAKE 1 TABLET BY MOUTH DAILY 30 tablet 2   losartan (COZAAR) 100 MG tablet Take 1 tablet daily 90 tablet 1   Current Facility-Administered Medications  Medication Dose Route Frequency Provider Last Rate Last Admin   0.9 %  sodium chloride infusion  500 mL Intravenous Once Jaymir Struble, Carlota Raspberry, MD        Allergies as of 06/21/2021   (No Known Allergies)    Family History  Problem Relation Age of Onset   Diabetes Mother    Hypertension Mother    Diabetes Father    Hypertension Father    Heart attack Father 36   Cancer Maternal Grandmother         pancreatic   Diabetes Sister    Hypertension Sister    Colon cancer Neg Hx    Colon polyps Neg Hx    Esophageal cancer Neg Hx    Stomach cancer Neg Hx    Rectal cancer Neg Hx     Social History   Socioeconomic History   Marital status: Divorced    Spouse name: Not on file   Number of children: Not on file   Years of education: Not on file   Highest education level: Not on file  Occupational History   Not on file  Tobacco Use   Smoking status: Never   Smokeless tobacco: Never  Vaping Use   Vaping Use: Never used  Substance and Sexual Activity   Alcohol use: No   Drug use: No   Sexual activity: Not Currently    Birth control/protection: Surgical    Comment: 1st intercourse-11, partners- 73,   Other Topics Concern   Not on file  Social History Narrative   Not on file   Social Determinants of Health   Financial Resource Strain: Not on file  Food Insecurity: Not on file  Transportation Needs: Not on file  Physical Activity: Not on file  Stress: Not on file  Social Connections: Not on file  Intimate Partner Violence: Not on file  Review of Systems: All other review of systems negative except as mentioned in the HPI.  Physical Exam: Vital signs BP 138/90   Pulse 64   Temp (!) 97.5 F (36.4 C) (Temporal)   Resp 19   Ht '5\' 6"'$  (1.676 m)   Wt 218 lb (98.9 kg)   LMP 07/13/2016   SpO2 100%   BMI 35.19 kg/m   General:   Alert,  Well-developed, well-nourished, pleasant and cooperative in NAD Lungs:  Clear throughout to auscultation.   Heart:  Regular rate and rhythm Abdomen:  Soft, nontender and nondistended.   Neuro/Psych:  Alert and cooperative. Normal mood and affect. A and O x 3  Jolly Mango, MD [facility]

## 2021-06-21 NOTE — Progress Notes (Signed)
Pt's states no medical or surgical changes since previsit or office visit.  Vitals DT 

## 2021-06-21 NOTE — Progress Notes (Signed)
Called to room to assist during endoscopic procedure.  Patient ID and intended procedure confirmed with present staff. Received instructions for my participation in the procedure from the performing physician.  

## 2021-06-21 NOTE — Patient Instructions (Signed)
Handout on polyps and diverticulosis given    YOU HAD AN ENDOSCOPIC PROCEDURE TODAY AT THE [facility]:   Refer to the procedure report that was given to you for any specific questions about what was found during the examination.  If the procedure report does not answer your questions, please call your gastroenterologist to clarify.  If you requested that your care partner not be given the details of your procedure findings, then the procedure report has been included in a sealed envelope for you to review at your convenience later.  YOU SHOULD EXPECT: Some feelings of bloating in the abdomen. Passage of more gas than usual.  Walking can help get rid of the air that was put into your GI tract during the procedure and reduce the bloating. If you had a lower endoscopy (such as a colonoscopy or flexible sigmoidoscopy) you may notice spotting of blood in your stool or on the toilet paper. If you underwent a bowel prep for your procedure, you may not have a normal bowel movement for a few days.  Please Note:  You might notice some irritation and congestion in your nose or some drainage.  This is from the oxygen used during your procedure.  There is no need for concern and it should clear up in a day or so.  SYMPTOMS TO REPORT IMMEDIATELY:   Following lower endoscopy (colonoscopy or flexible sigmoidoscopy):  Excessive amounts of blood in the stool  Significant tenderness or worsening of abdominal pains  Swelling of the abdomen that is new, acute  Fever of 100F or higher   For urgent or emergent issues, a gastroenterologist can be reached at any hour by calling [phone]. Do not use MyChart messaging for urgent concerns.    DIET:  We do recommend a small meal at first, but then you may proceed to your regular diet.  Drink plenty of fluids but you should avoid alcoholic beverages for 24 hours.  ACTIVITY:  You should plan to take it easy for the rest of today and you should NOT  DRIVE or use heavy machinery until tomorrow (because of the sedation medicines used during the test).    FOLLOW UP: Our staff will call the number listed on your records 48-72 hours following your procedure to check on you and address any questions or concerns that you may have regarding the information given to you following your procedure. If we do not reach you, we will leave a message.  We will attempt to reach you two times.  During this call, we will ask if you have developed any symptoms of COVID 19. If you develop any symptoms (ie: fever, flu-like symptoms, shortness of breath, cough etc.) before then, please call [phone].  If you test positive for Covid 19 in the 2 weeks post procedure, please call and report this information to us.    If any biopsies were taken you will be contacted by phone or by letter within the next 1-3 weeks.  Please call us at [phone] if you have not heard about the biopsies in 3 weeks.    SIGNATURES/CONFIDENTIALITY: You and/or your care partner have signed paperwork which will be entered into your electronic medical record.  These signatures attest to the fact that that the information above on your After Visit Summary has been reviewed and is understood.  Full responsibility of the confidentiality of this discharge information lies with you and/or your care-partner. 

## 2021-06-21 NOTE — Op Note (Signed)
[facility] Patient Name: Mia Davis Procedure Date: 06/21/2021 9:59 AM MRN: [id number] Endoscopist: Remo Lipps P. Havery Moros , MD Age: 55 Referring MD:  Date of Birth: 1965-12-19 Gender: Female Account #: [id number] Procedure:                Colonoscopy Indications:              Surveillance: Personal history of colonic polyps on                            last colonoscopy (12/2017 - advanced adenoma) Medicines:                Monitored Anesthesia Care Procedure:                Pre-Anesthesia Assessment:                           - Prior to the procedure, a History and Physical                            was performed, and patient medications and                            allergies were reviewed. The patient's tolerance of                            previous anesthesia was also reviewed. The risks                            and benefits of the procedure and the sedation                            options and risks were discussed with the patient.                            All questions were answered, and informed consent                            was obtained. Prior Anticoagulants: The patient has                            taken no previous anticoagulant or antiplatelet                            agents. ASA Grade Assessment: II - A patient with                            mild systemic disease. After reviewing the risks                            and benefits, the patient was deemed in                            satisfactory condition to undergo the procedure.  After obtaining informed consent, the colonoscope                            was passed under direct vision. Throughout the                            procedure, the patient's blood pressure, pulse, and                            oxygen saturations were monitored continuously. The                            Olympus CF-HQ190L ([id number]) Colonoscope was                            introduced  through the anus and advanced to the the                            cecum, identified by appendiceal orifice and                            ileocecal valve. The colonoscopy was performed                            without difficulty. The patient tolerated the                            procedure well. The quality of the bowel                            preparation was good. The ileocecal valve,                            appendiceal orifice, and rectum were photographed. Scope In: 10:07:56 AM Scope Out: 10:25:00 AM Scope Withdrawal Time: 0 hours 15 minutes 42 seconds  Total Procedure Duration: 0 hours 17 minutes 4 seconds  Findings:                 The perianal and digital rectal examinations were                            normal.                           Three sessile polyps were found in the cecum. The                            polyps were 3 mm in size. These polyps were removed                            with a cold snare. Resection and retrieval were                            complete.  A 4 mm polyp was found in the ascending colon. The                            polyp was sessile. The polyp was removed with a                            cold snare. Resection and retrieval were complete.                           Scattered small-mouthed diverticula were found in                            the entire colon.                           The exam was otherwise without abnormality. Complications:            No immediate complications. Estimated blood loss:                            Minimal. Estimated Blood Loss:     Estimated blood loss was minimal. Impression:               - Three 3 mm polyps in the cecum, removed with a                            cold snare. Resected and retrieved.                           - One 4 mm polyp in the ascending colon, removed                            with a cold snare. Resected and retrieved.                           -  Diverticulosis in the entire examined colon.                           - The examination was otherwise normal. Recommendation:           - Patient has a contact number available for                            emergencies. The signs and symptoms of potential                            delayed complications were discussed with the                            patient. Return to normal activities tomorrow.                            Written discharge instructions were provided to the  patient.                           - Resume previous diet.                           - Continue present medications.                           - Await pathology results. Remo Lipps P. Naiomi Musto, MD 06/21/2021 10:29:13 AM This report has been signed electronically.

## 2021-06-23 ENCOUNTER — Telehealth: Payer: Self-pay

## 2021-06-23 NOTE — Telephone Encounter (Signed)
  Follow up Call-  Call back number 06/21/2021  Post procedure Call Back phone  # [phone]  Permission to leave phone message Yes  Some recent data might be hidden     Patient questions:  Do you have a fever, pain , or abdominal swelling? No. Pain Score  0 *  Have you tolerated food without any problems? Yes.    Have you been able to return to your normal activities? Yes.    Do you have any questions about your discharge instructions: Diet   No. Medications  No. Follow up visit  No.  Do you have questions or concerns about your Care? No.  Actions: * If pain score is 4 or above: No action needed, pain <4.   Have you developed a fever since your procedure? no  2.   Have you had an respiratory symptoms (SOB or cough) since your procedure? no  3.   Have you tested positive for COVID 19 since your procedure no  4.   Have you had any family members/close contacts diagnosed with the COVID 19 since your procedure?  no   If yes to any of these questions please route to Joylene John, RN and Joella Prince, RN

## 2021-07-02 ENCOUNTER — Encounter: Payer: Self-pay | Admitting: Family Medicine

## 2021-07-09 ENCOUNTER — Other Ambulatory Visit: Payer: Self-pay | Admitting: Family Medicine

## 2021-07-09 DIAGNOSIS — I10 Essential (primary) hypertension: Secondary | ICD-10-CM

## 2021-07-11 ENCOUNTER — Ambulatory Visit (INDEPENDENT_AMBULATORY_CARE_PROVIDER_SITE_OTHER): Payer: BC Managed Care – PPO | Admitting: Family Medicine

## 2021-07-11 ENCOUNTER — Encounter: Payer: Self-pay | Admitting: Family Medicine

## 2021-07-11 ENCOUNTER — Other Ambulatory Visit: Payer: Self-pay

## 2021-07-11 VITALS — BP 132/86 | HR 68 | Ht 65.5 in | Wt 224.4 lb

## 2021-07-11 DIAGNOSIS — Z23 Encounter for immunization: Secondary | ICD-10-CM | POA: Diagnosis not present

## 2021-07-11 DIAGNOSIS — I10 Essential (primary) hypertension: Secondary | ICD-10-CM

## 2021-07-11 DIAGNOSIS — R55 Syncope and collapse: Secondary | ICD-10-CM

## 2021-07-11 DIAGNOSIS — R079 Chest pain, unspecified: Secondary | ICD-10-CM | POA: Diagnosis not present

## 2021-07-11 NOTE — Patient Instructions (Signed)
Syncope Syncope refers to a condition in which a person temporarily loses consciousness. Syncope may also be called fainting or passing out. It is caused by a sudden decrease in blood flow to the brain. Even though most causes of syncope are not dangerous, syncope can be a sign of a serious medical problem. Your health care provider may do tests to find the reason why you are having syncope. Signs that you may be about to faint include: Feeling dizzy or light-headed. Feeling nauseous. Seeing all white or all black in your field of vision. Having cold, clammy skin. If you faint, get medical help right away. Call your local emergency services (911 in the [place] Do not drive yourself to the hospital. Follow these instructions at home: Pay attention to any changes in your symptoms. Take these actions to stay safe and to help relieve your symptoms: Lifestyle Do not drive, use machinery, or play sports until your health care provider says it is okay. Do not drink alcohol. Do not use any products that contain nicotine or tobacco, such as cigarettes and e-cigarettes. If you need help quitting, ask your health care provider. Drink enough fluid to keep your urine pale yellow. General instructions Take over-the-counter and prescription medicines only as told by your health care provider. If you are taking blood pressure or heart medicine, get up slowly and take several minutes to sit and then stand. This can reduce dizziness or light-headedness. Have someone stay with you until you feel stable. If you start to feel like you might faint, lie down right away and raise (elevate) your feet above the level of your heart. Breathe deeply and steadily. Wait until all the symptoms have passed. Keep all follow-up visits as told by your health care provider. This is important. Get help right away if you: Have a severe headache. Faint once or repeatedly. Have pain in your chest, abdomen, or back. Have a very fast  or irregular heartbeat (palpitations). Have pain when you breathe. Are bleeding from your mouth or rectum, or you have black or tarry stool. Have a seizure. Are confused. Have trouble walking. Have severe weakness. Have vision problems. These symptoms may represent a serious problem that is an emergency. Do not wait to see if your symptoms will go away. Get medical help right away. Call your local emergency services (911 in the [place] Do not drive yourself to the hospital. Summary Syncope refers to a condition in which a person temporarily loses consciousness. It is caused by a sudden decrease in blood flow to the brain. Signs that you may be about to faint include dizziness, feeling light-headed, feeling nauseous, sudden vision changes, or cold, clammy skin. Although most causes of syncope are not dangerous, syncope can be a sign of a serious medical problem. If you faint, get medical help right away. This information is not intended to replace advice given to you by your health care provider. Make sure you discuss any questions you have with your health care provider. Document Revised: 01/06/2020 Document Reviewed: 02/05/2020 Elsevier Patient Education  [place].

## 2021-07-11 NOTE — Progress Notes (Signed)
   Subjective:    Patient ID: Mia Davis, female    DOB: Jul 18, 1966, 55 y.o.   MRN: [id number]  HPI Chief Complaint  Patient presents with   Black out spells    Had 1 episode in June and another episode a couple weeks ago. Vision will get blurry and will have chest burning sensation. Happened in June while outside and sat down then passed out for a short period of time, and last year during football game. Does not check blood pressure at home   She is here with complaints of 3 syncopal episodes. The last one occurred 2 weeks ago, prior to that was June 2022 and then last year.  States she noticed her vision getting blurry, felt lightheaded, and then a burning sensation in her chest.  States she had witnessed loss of consciousness for a few seconds and denies falling or injury. States someone was with her to help her sit down or lay down prior to the "black outs".  States on all of these occasions she was standing in one place.  She did note chest pain on all 3 occasions.  Denies tunnel vision, headache or N/V. No numbness, tingling or weakness.   States she has not been evaluated for these episodes.   She has felt at baseline, which is good, other than the above mentioned episodes.   Hypertension- does not check her BP at home. Taking medication daily.   Biological father has history of heart attacks. No other family hx  Denies fever, chills, palpitations, shortness of breath, abdominal pain, N/V/D, urinary symptoms, LE edema.   Reviewed allergies, medications, past medical, surgical, family, and social history.   Review of Systems Pertinent positives and negatives in the history of present illness.     Objective:   Physical Exam BP 132/86 (BP Location: Right Arm, Patient Position: Sitting)   Pulse 68   Ht 5' 5.5" (1.664 m)   Wt 224 lb 6.4 oz (101.8 kg)   LMP 07/13/2016   SpO2 98%   BMI 36.77 kg/m   Alert and in no distress.  Neck is supple without adenopathy or  thyromegaly. Cardiac exam shows a regular rhythm without murmurs or gallops. Lungs are clear to auscultation. Extremities without edema. Skin is warm and dry. CNs intact, no focal weakness. Normal gait.       Assessment & Plan:  Syncope, unspecified syncope type - Plan: CBC with Differential/Platelet, Comprehensive metabolic panel, EKG 35-WYSH, TSH, T4, free, Ambulatory referral to Cardiology  Essential hypertension - Plan: CBC with Differential/Platelet, Comprehensive metabolic panel, Ambulatory referral to Cardiology  Intermittent chest pain - Plan: CBC with Differential/Platelet, Comprehensive metabolic panel, EKG 68-HFGB, TSH, T4, free, Ambulatory referral to Cardiology  EKG shows sinus bradycardia, rate 56.  No acute ST changes.  Low voltage. 3 syncopal episodes with her last one 2 weeks ago.  She has had chest pain with each episode.  Apparently she has been standing in place prior to the syncopal episodes. Encouraged her to keep an eye on her blood pressure at home. Refer to cardiology for further evaluation.  Follow-up pending lab results

## 2021-07-12 ENCOUNTER — Encounter: Payer: Self-pay | Admitting: Family Medicine

## 2021-07-12 LAB — COMPREHENSIVE METABOLIC PANEL
ALT: 11 IU/L (ref 0–32)
AST: 16 IU/L (ref 0–40)
Albumin/Globulin Ratio: 1.6 (ref 1.2–2.2)
Albumin: 4.3 g/dL (ref 3.8–4.9)
Alkaline Phosphatase: 92 IU/L (ref 44–121)
BUN/Creatinine Ratio: 11 (ref 9–23)
BUN: 12 mg/dL (ref 6–24)
Bilirubin Total: 1.2 mg/dL (ref 0.0–1.2)
CO2: 21 mmol/L (ref 20–29)
Calcium: 9 mg/dL (ref 8.7–10.2)
Chloride: 101 mmol/L (ref 96–106)
Creatinine, Ser: 1.05 mg/dL — ABNORMAL HIGH (ref 0.57–1.00)
Globulin, Total: 2.7 g/dL (ref 1.5–4.5)
Glucose: 92 mg/dL (ref 70–99)
Potassium: 4.3 mmol/L (ref 3.5–5.2)
Sodium: 135 mmol/L (ref 134–144)
Total Protein: 7 g/dL (ref 6.0–8.5)
eGFR: 62 mL/min/{1.73_m2} (ref >59)

## 2021-07-12 LAB — CBC WITH DIFFERENTIAL/PLATELET
Basophils Absolute: 0 10*3/uL (ref 0.0–0.2)
Basos: 0 %
EOS (ABSOLUTE): 0.1 10*3/uL (ref 0.0–0.4)
Eos: 2 %
Hematocrit: 44.6 % (ref 34.0–46.6)
Hemoglobin: 14.9 g/dL (ref 11.1–15.9)
Immature Grans (Abs): 0 10*3/uL (ref 0.0–0.1)
Immature Granulocytes: 0 %
Lymphocytes Absolute: 2.8 10*3/uL (ref 0.7–3.1)
Lymphs: 41 %
MCH: 26.4 pg — ABNORMAL LOW (ref 26.6–33.0)
MCHC: 33.4 g/dL (ref 31.5–35.7)
MCV: 79 fL (ref 79–97)
Monocytes Absolute: 0.5 10*3/uL (ref 0.1–0.9)
Monocytes: 7 %
Neutrophils Absolute: 3.5 10*3/uL (ref 1.4–7.0)
Neutrophils: 50 %
Platelets: 317 10*3/uL (ref 150–450)
RBC: 5.65 x10E6/uL — ABNORMAL HIGH (ref 3.77–5.28)
RDW: 14.6 % (ref 11.7–15.4)
WBC: 7 10*3/uL (ref 3.4–10.8)

## 2021-07-12 LAB — TSH: TSH: 1.49 u[IU]/mL (ref 0.450–4.500)

## 2021-07-12 LAB — T4, FREE: Free T4: 1.35 ng/dL (ref 0.82–1.77)

## 2021-08-21 NOTE — Progress Notes (Signed)
Cardiology Office Note:    Date:  08/21/2021   ID:  Mia Davis, DOB 1966/01/10, MRN [id number]  PCP:  Davy Pique   [facility] Providers Cardiologist:  None     Referring MD: Girtha Rm, PA-C   No chief complaint on file. Syncope  History of Present Illness:    Mia Davis is a 55 y.o. female with a hx of hypertension, referral for syncope  She was at her sons football game. She started to black out and had middle chest pain, it was a burning sensation. She followed a policemen and walked to his car. She sat down and passed out. Her vision changed and she feels hot. She had an episode like this in June near a food truck. She was at a [employer] and had a similar episode. She denies LH, dizziness. During the day she works at [employer] and sitting at the computer. She is not too active.  She denies exertional chest pressure or dyspnea on exertion. No smoking hx. No family hx of heart rhythm issues. No SCD.  No history of a cardiac stress test  She has normal renal fxn. Bp meds include norvasc 10 mg daily and losartan 100 mg daily. Bps typically well controlled.  Past Medical History:  Diagnosis Date   Anemia    Colon polyps 01/01/2018   Diverticula of colon 01/01/2018   Hypertension     Past Surgical History:  Procedure Laterality Date   ABDOMINAL HYSTERECTOMY  2017   abdominal    COLONOSCOPY     HYSTERECTOMY ABDOMINAL WITH SALPINGECTOMY Bilateral 09/12/2016   Procedure: HYSTERECTOMY ABDOMINAL WITH SALPINGECTOMY;  Surgeon: Mora Bellman, MD;  Location: [facility];  Service: Gynecology;  Laterality: Bilateral;   KNEE SURGERY Right    POLYPECTOMY     TUBAL LIGATION      Current Medications: No outpatient medications have been marked as taking for the 08/22/21 encounter (Appointment) with Janina Mayo, MD.     Allergies:   Patient has no known allergies.   Social History   Socioeconomic History   Marital status: Divorced    Spouse name: Not on  file   Number of children: Not on file   Years of education: Not on file   Highest education level: Not on file  Occupational History   Not on file  Tobacco Use   Smoking status: Never   Smokeless tobacco: Never  Vaping Use   Vaping Use: Never used  Substance and Sexual Activity   Alcohol use: No   Drug use: No   Sexual activity: Not Currently    Birth control/protection: Surgical    Comment: 1st intercourse-11, partners- 28,   Other Topics Concern   Not on file  Social History Narrative   Not on file   Social Determinants of Health   Financial Resource Strain: Not on file  Food Insecurity: Not on file  Transportation Needs: Not on file  Physical Activity: Not on file  Stress: Not on file  Social Connections: Not on file     Family History: The patient's family history includes Cancer in her maternal grandmother; Diabetes in her father, mother, and sister; Heart attack (age of onset: 50) in her father; Hypertension in her father, mother, and sister. There is no history of Colon cancer, Colon polyps, Esophageal cancer, Stomach cancer, or Rectal cancer.  ROS:   Please see the history of present illness.     All other systems reviewed and are  negative.  EKGs/Labs/Other Studies Reviewed:    The following studies were reviewed today:   EKG:  EKG is  ordered today.  The ekg ordered today demonstrates   NSR, QTc 410 ms  Recent Labs: 07/11/2021: ALT 11; BUN 12; Creatinine, Ser 1.05; Hemoglobin 14.9; Platelets 317; Potassium 4.3; Sodium 135; TSH 1.490  Recent Lipid Panel    Component Value Date/Time   CHOL 159 08/30/2020 0914   TRIG 90 08/30/2020 0914   HDL 44 08/30/2020 0914   CHOLHDL 3.6 08/30/2020 0914   CHOLHDL 3.0 08/21/2017 0859   VLDL 15 08/11/2016 1606   LDLCALC 98 08/30/2020 0914   LDLCALC 73 08/21/2017 0859     Risk Assessment/Calculations:           Physical Exam:    VS:  Vitals:   08/22/21 1531  BP: 130/72  Pulse: 60     Wt Readings  from Last 3 Encounters:  07/11/21 224 lb 6.4 oz (101.8 kg)  06/21/21 218 lb (98.9 kg)  06/07/21 218 lb (98.9 kg)     GEN:  Well nourished, well developed in no acute distress HEENT: Normal NECK: No JVD; No carotid bruits LYMPHATICS: No lymphadenopathy CARDIAC: RRR, no murmurs, rubs, gallops RESPIRATORY:  Clear to auscultation without rales, wheezing or rhonchi  ABDOMEN: Soft, non-tender, non-distended MUSCULOSKELETAL:  No edema; No deformity  SKIN: Warm and dry NEUROLOGIC:  Alert and oriented x 3 PSYCHIATRIC:  Normal affect   ASSESSMENT:    #Vasovagal Syncope: She reports a prodrome prior to her syncopal event. This is c/w vasovagal syncope. Her EKG was normal. Normal Qtc. No AV conduction delay. No ectopy. We handed out material on syncope and discussed that if her symptoms change she can let us know. Otherwise, will plan for PRN follow up  PLAN:    In order of problems listed above:  PRN Follow up     Medication Adjustments/Labs and Tests Ordered: Current medicines are reviewed at length with the patient today.  Concerns regarding medicines are outlined above.  Signed, Janina Mayo, MD  08/21/2021 4:42 PM    Mia Davis

## 2021-08-22 ENCOUNTER — Encounter: Payer: Self-pay | Admitting: Internal Medicine

## 2021-08-22 ENCOUNTER — Ambulatory Visit: Payer: BC Managed Care – PPO | Admitting: Internal Medicine

## 2021-08-22 ENCOUNTER — Other Ambulatory Visit: Payer: Self-pay

## 2021-08-22 VITALS — BP 130/72 | HR 60 | Ht 66.0 in | Wt 228.6 lb

## 2021-08-22 DIAGNOSIS — R55 Syncope and collapse: Secondary | ICD-10-CM

## 2021-08-22 NOTE — Patient Instructions (Signed)
Medication Instructions:  No Changes In Medications at this time.  *If you need a refill on your cardiac medications before your next appointment, please call your pharmacy*  Follow-Up: At [facility], you and your health needs are our priority.  As part of our continuing mission to provide you with exceptional heart care, we have created designated Provider Care Teams.  These Care Teams include your primary Cardiologist (physician) and Advanced Practice Providers (APPs -  Physician Assistants and Nurse Practitioners) who all work together to provide you with the care you need, when you need it.  Your next appointment:   AS NEEDED  The format for your next appointment:   In Person  Provider:   Janina Mayo, MD {  Other Instructions Syncope, Adult Syncope is when you pass out or faint for a short time. It is caused by a sudden decrease in blood flow to the brain. This can happen for many reasons. It can sometimes happen when seeing blood, getting a shot (injection), or having pain or strong emotions. Most causes of fainting are not dangerous, but in some cases it can be a sign of a serious medical problem. If you faint, get help right away. Call your local emergency services (911 in the [place] Follow these instructions at home: Watch for any changes in your symptoms. Take these actions to stay safe and help with your symptoms: Knowing when you may be about to faint Signs that you may be about to faint include: Feeling dizzy or light-headed. It may feel like the room is spinning. Feeling weak. Feeling like you may vomit (nauseous). Seeing spots or seeing all white or all black. Having cold, clammy skin. Feeling warm and sweaty. Hearing ringing in the ears. If you start to feel like you might faint, sit or lie down right away. If sitting, lower your head down between your legs. If lying down, raise (elevate) your feet above the level of your heart. Breathe deeply and steadily. Wait  until all of the symptoms are gone. Have someone stay with you until you feel better. Medicines Take over-the-counter and prescription medicines only as told by your doctor. If you are taking blood pressure or heart medicine, sit up and stand up slowly. Spend a few minutes getting ready to sit and then stand. This can help you feel less dizzy.  General instructions Talk with your doctor about your symptoms. You may need to have testing to help find the cause. Drink enough fluid to keep your pee (urine) pale yellow. Avoid standing for a long time. If you must stand for a long time, do movements such as: Moving your legs. Crossing your legs. Flexing and stretching your leg muscles. Squatting. Keep all follow-up visits. Contact a doctor if: You have episodes of near fainting. Get help right away if: You pass out or faint. You hit your head or are injured after fainting. You have any of these symptoms: Fast or uneven heartbeats (palpitations). Pain in your chest, belly, or back. Shortness of breath. You have jerky movements that you cannot control (seizure). You have a very bad headache. You are confused. You have problems with how you see (vision). You are very weak. You have trouble walking. You are bleeding from your mouth or your butt (rectum). You have black or tarry poop (stool). These symptoms may be an emergency. Get help right away. Call your local emergency services (911 in the [place] Do not wait to see if the symptoms will go away.  Do not drive yourself to the hospital. Summary Syncope is when you pass out or faint for a short time. It is caused by a sudden decrease in blood flow to the brain. Signs that you may be about to faint include feeling dizzy or light-headed, feeling like you may vomit, seeing all white or all black, or having cold, clammy skin. If you start to feel like you might faint, sit or lie down right away. Lower your head if sitting, or raise (elevate)  your feet if lying down. Breathe deeply and steadily. Wait until all of the symptoms are gone. This information is not intended to replace advice given to you by your health care provider. Make sure you discuss any questions you have with your health care provider. Document Revised: 02/03/2021 Document Reviewed: 02/03/2021 Elsevier Patient Education  2022 [employer].

## 2021-09-12 ENCOUNTER — Encounter: Payer: BC Managed Care – PPO | Admitting: Family Medicine

## 2021-10-17 DIAGNOSIS — Z1231 Encounter for screening mammogram for malignant neoplasm of breast: Secondary | ICD-10-CM | POA: Diagnosis not present

## 2021-10-18 ENCOUNTER — Encounter: Payer: Self-pay | Admitting: Obstetrics & Gynecology

## 2021-11-09 ENCOUNTER — Other Ambulatory Visit: Payer: Self-pay

## 2021-11-09 ENCOUNTER — Encounter: Payer: Self-pay | Admitting: Physician Assistant

## 2021-11-09 DIAGNOSIS — I10 Essential (primary) hypertension: Secondary | ICD-10-CM

## 2021-11-09 MED ORDER — AMLODIPINE BESYLATE 10 MG PO TABS: 10.0000 mg | ORAL_TABLET | Freq: Every day | ORAL | 0 refills | Status: DC

## 2021-11-09 MED ORDER — LOSARTAN POTASSIUM 100 MG PO TABS: ORAL_TABLET | ORAL | 0 refills | Status: DC

## 2021-11-10 ENCOUNTER — Other Ambulatory Visit: Payer: Self-pay

## 2021-11-10 DIAGNOSIS — I10 Essential (primary) hypertension: Secondary | ICD-10-CM

## 2021-11-10 MED ORDER — AMLODIPINE BESYLATE 10 MG PO TABS: 10.0000 mg | ORAL_TABLET | Freq: Every day | ORAL | 0 refills | Status: DC

## 2021-11-21 ENCOUNTER — Ambulatory Visit (INDEPENDENT_AMBULATORY_CARE_PROVIDER_SITE_OTHER): Payer: BC Managed Care – PPO | Admitting: Physician Assistant

## 2021-11-21 ENCOUNTER — Encounter: Payer: Self-pay | Admitting: Physician Assistant

## 2021-11-21 ENCOUNTER — Other Ambulatory Visit: Payer: Self-pay

## 2021-11-21 VITALS — BP 136/88 | HR 72 | Ht 65.75 in | Wt 225.0 lb

## 2021-11-21 DIAGNOSIS — I10 Essential (primary) hypertension: Secondary | ICD-10-CM | POA: Diagnosis not present

## 2021-11-21 DIAGNOSIS — Z23 Encounter for immunization: Secondary | ICD-10-CM | POA: Diagnosis not present

## 2021-11-21 DIAGNOSIS — H9312 Tinnitus, left ear: Secondary | ICD-10-CM | POA: Diagnosis not present

## 2021-11-21 DIAGNOSIS — Z0000 Encounter for general adult medical examination without abnormal findings: Secondary | ICD-10-CM

## 2021-11-21 MED ORDER — AMLODIPINE BESYLATE 10 MG PO TABS: 10.0000 mg | ORAL_TABLET | Freq: Every day | ORAL | 1 refills | Status: DC

## 2021-11-21 MED ORDER — LOSARTAN POTASSIUM 100 MG PO TABS: ORAL_TABLET | ORAL | 1 refills | Status: DC

## 2021-11-21 NOTE — Patient Instructions (Signed)
You can try the OTC supplement Lipoflavinoids for tinnitus / ringing in left ear.

## 2021-11-21 NOTE — Progress Notes (Signed)
° °Established Patient Office Visit ° °Subjective:  °Patient ID: Mia Davis, female    DOB: 05/07/1966  Age: 55 y.o. MRN: [id number] ° °CC:  °Chief Complaint  °Patient presents with  ° Annual Exam  ° ° °HPI °Mia Davis presents for an annual exam. Reports ongoing left knee pain and ringing in left ear. States she has already been to Orthopedics for her knee, received steroid injections in the past, and and at this time doesn't want additional treatment or evaluation with Orthopedics. Also, has had a hearing test and evaluation by an ENT, Dr. Newman on 02/23/2020, but yet her left ear ringing persists.  ° °Past Medical History:  °Diagnosis Date  ° Anemia   ° Colon polyps 01/01/2018  ° Diverticula of colon 01/01/2018  ° Hypertension   ° ° °Past Surgical History:  °Procedure Laterality Date  ° ABDOMINAL HYSTERECTOMY  2017  ° abdominal   ° COLONOSCOPY    ° HYSTERECTOMY ABDOMINAL WITH SALPINGECTOMY Bilateral 09/12/2016  ° Procedure: HYSTERECTOMY ABDOMINAL WITH SALPINGECTOMY;  Surgeon: Peggy Constant, MD;  Location: [facility];  Service: Gynecology;  Laterality: Bilateral;  ° KNEE SURGERY Right   ° POLYPECTOMY    ° TUBAL LIGATION    ° ° °Family History  °Problem Relation Age of Onset  ° Diabetes Mother   ° Hypertension Mother   ° Diabetes Father   ° Hypertension Father   ° Heart attack Father 80  ° Cancer Maternal Grandmother   °     pancreatic  ° Diabetes Sister   ° Hypertension Sister   ° Colon cancer Neg Hx   ° Colon polyps Neg Hx   ° Esophageal cancer Neg Hx   ° Stomach cancer Neg Hx   ° Rectal cancer Neg Hx   ° ° °Social History  ° °Socioeconomic History  ° Marital status: Divorced  °  Spouse name: Not on file  ° Number of children: Not on file  ° Years of education: Not on file  ° Highest education level: Not on file  °Occupational History  ° Not on file  °Tobacco Use  ° Smoking status: Never  ° Smokeless tobacco: Never  °Vaping Use  ° Vaping Use: Never used  °Substance and Sexual Activity  ° Alcohol use: No  ° Drug  use: No  ° Sexual activity: Not Currently  °  Birth control/protection: Surgical  °  Comment: 1st intercourse-11, partners- 5,   °Other Topics Concern  ° Not on file  °Social History Narrative  ° Not on file  ° °Social Determinants of Health  ° °Financial Resource Strain: Not on file  °Food Insecurity: Not on file  °Transportation Needs: Not on file  °Physical Activity: Not on file  °Stress: Not on file  °Social Connections: Not on file  °Intimate Partner Violence: Not on file  ° ° °Outpatient Medications Prior to Visit  °Medication Sig Dispense Refill  ° amLODipine (NORVASC) 10 MG tablet Take 1 tablet (10 mg total) by mouth daily. 90 tablet 0  ° losartan (COZAAR) 100 MG tablet Take 1 tablet daily 90 tablet 0  ° °No facility-administered medications prior to visit.  ° ° °No Known Allergies ° °ROS °Review of Systems  °Constitutional:  Negative for activity change and fever.  °HENT:  Positive for tinnitus. Negative for congestion, ear pain and voice change.   °Eyes:  Negative for redness.  °Respiratory:  Negative for cough.   °Cardiovascular:  Negative for chest pain.  °Gastrointestinal:  Negative for   constipation and diarrhea.  °Endocrine: Negative for polyuria.  °Genitourinary:  Negative for flank pain.  °Musculoskeletal:  Negative for gait problem and neck stiffness.  °Skin:  Negative for color change and rash.  °Neurological:  Negative for dizziness.  °Hematological:  Negative for adenopathy.  °Psychiatric/Behavioral:  Negative for agitation, behavioral problems and confusion.   ° °  °Objective:  °  °Physical Exam °Vitals and nursing note reviewed.  °Constitutional:   °   General: She is not in acute distress. °   Appearance: Normal appearance.  °HENT:  °   Head: Normocephalic and atraumatic.  °   Right Ear: Tympanic membrane, ear canal and external ear normal.  °   Left Ear: Tympanic membrane, ear canal and external ear normal.  °Eyes:  °   Conjunctiva/sclera: Conjunctivae normal.  °   Pupils: Pupils are equal,  round, and reactive to light.  °Neck:  °   Vascular: No carotid bruit.  °Cardiovascular:  °   Rate and Rhythm: Normal rate and regular rhythm.  °   Pulses: Normal pulses.  °   Heart sounds: Normal heart sounds.  °Pulmonary:  °   Effort: Pulmonary effort is normal. No respiratory distress.  °   Breath sounds: Normal breath sounds. No wheezing.  °Abdominal:  °   General: Bowel sounds are normal.  °   Palpations: Abdomen is soft.  °Musculoskeletal:     °   General: Normal range of motion.  °   Cervical back: Normal range of motion and neck supple.  °   Right lower leg: No edema.  °   Left lower leg: No edema.  °Skin: °   General: Skin is warm and dry.  °   Findings: No rash.  °Neurological:  °   General: No focal deficit present.  °   Mental Status: She is alert and oriented to person, place, and time.  °   Gait: Gait normal.  °Psychiatric:     °   Mood and Affect: Mood normal.     °   Behavior: Behavior normal.  ° ° °BP 136/88    Pulse 72    Ht 5' 5.75" (1.67 m)    Wt 225 lb (102.1 kg)    LMP 07/13/2016    SpO2 96%    BMI 36.59 kg/m²  °Wt Readings from Last 3 Encounters:  °11/21/21 225 lb (102.1 kg)  °08/22/21 228 lb 9.6 oz (103.7 kg)  °07/11/21 224 lb 6.4 oz (101.8 kg)  ° ° ° °Health Maintenance Due  °Topic Date Due  ° Zoster Vaccines- Shingrix (1 of 2) Never done  ° ° °There are no preventive care reminders to display for this patient. ° °Lab Results  °Component Value Date  ° TSH 1.490 07/11/2021  ° °Lab Results  °Component Value Date  ° WBC 7.0 07/11/2021  ° HGB 14.9 07/11/2021  ° HCT 44.6 07/11/2021  ° MCV 79 07/11/2021  ° PLT 317 07/11/2021  ° °Lab Results  °Component Value Date  ° NA 135 07/11/2021  ° K 4.3 07/11/2021  ° CO2 21 07/11/2021  ° GLUCOSE 92 07/11/2021  ° BUN 12 07/11/2021  ° CREATININE 1.05 (H) 07/11/2021  ° BILITOT 1.2 07/11/2021  ° ALKPHOS 92 07/11/2021  ° AST 16 07/11/2021  ° ALT 11 07/11/2021  ° PROT 7.0 07/11/2021  ° ALBUMIN 4.3 07/11/2021  ° CALCIUM 9.0 07/11/2021  ° ANIONGAP 6 09/08/2016  °  EGFR 62 07/11/2021  ° °Lab Results  °Component   Component Value Date   CHOL 159 08/30/2020   Lab Results  Component Value Date   HDL 44 08/30/2020   Lab Results  Component Value Date   LDLCALC 98 08/30/2020   Lab Results  Component Value Date   TRIG 90 08/30/2020   Lab Results  Component Value Date   CHOLHDL 3.6 08/30/2020   Lab Results  Component Value Date   HGBA1C 5.5 08/28/2019      Assessment & Plan:   Problem List Items Addressed This Visit       Cardiovascular and Mediastinum   Essential hypertension   Relevant Medications   amLODipine (NORVASC) 10 MG tablet   losartan (COZAAR) 100 MG tablet   Other Visit Diagnoses     Routine medical exam    -  Primary   Relevant Orders   Lipid panel   Varicella-zoster vaccine subcutaneous   Tinnitus of left ear       Need for shingles vaccine       Relevant Orders   Varicella-zoster vaccine subcutaneous      Patient advised to try OTC supplement Lipoflavinoids for left ear tinnitus, stay hydrated with 8 - 10 glasses of water daily, declines follow up referral to ENT. Also advised patient she may look for a noise cancelling type app or sounds on YouTube to help deal with the ringing while trying to go to sleep.   Meds ordered this encounter  Medications   amLODipine (NORVASC) 10 MG tablet    Sig: Take 1 tablet (10 mg total) by mouth daily.    Dispense:  90 tablet    Refill:  1    Order Specific Question:   Supervising Provider    Answer:   Denita Lung [[id number]]   losartan (COZAAR) 100 MG tablet    Sig: Take 1 tablet daily    Dispense:  90 tablet    Refill:  1    Order Specific Question:   Supervising Provider    Answer:   Denita Lung [[id number]]    Follow-up: Return in about 1 year (around 11/21/2022) for Return for annual exam 45 minutes.    Irene Pap, PA-C

## 2021-11-22 LAB — LIPID PANEL
Chol/HDL Ratio: 3.4 ratio (ref 0.0–4.4)
Cholesterol, Total: 156 mg/dL (ref 100–199)
HDL: 46 mg/dL (ref >39)
LDL Chol Calc (NIH): 94 mg/dL (ref 0–99)
Triglycerides: 87 mg/dL (ref 0–149)
VLDL Cholesterol Cal: 16 mg/dL (ref 5–40)

## 2022-01-19 ENCOUNTER — Encounter: Payer: Self-pay | Admitting: Physician Assistant

## 2022-02-10 ENCOUNTER — Other Ambulatory Visit (INDEPENDENT_AMBULATORY_CARE_PROVIDER_SITE_OTHER): Payer: BC Managed Care – PPO

## 2022-02-10 DIAGNOSIS — Z23 Encounter for immunization: Secondary | ICD-10-CM

## 2022-03-11 IMAGING — CR DG KNEE COMPLETE 4+V*R*
4 series · 4 of 4 positions shown · non-contrast
Comparison: None.

CLINICAL DATA: Pain, pop 5 days ago while walking stairs

EXAM:
RIGHT KNEE - COMPLETE 4+ VIEW

[t knee ap right]
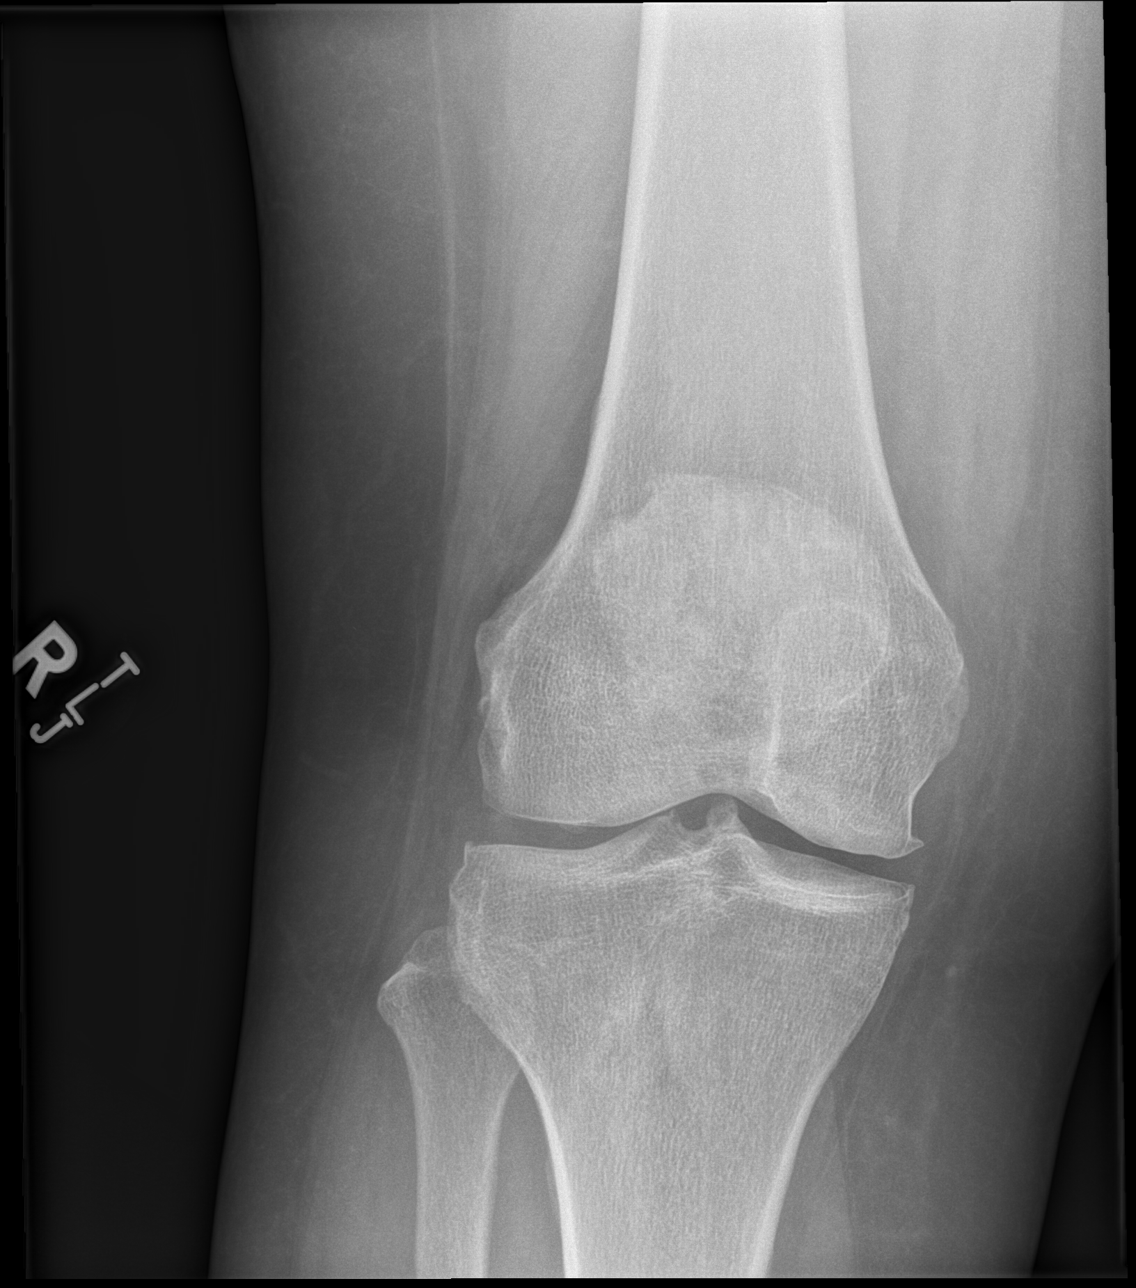

[t knee obl right]
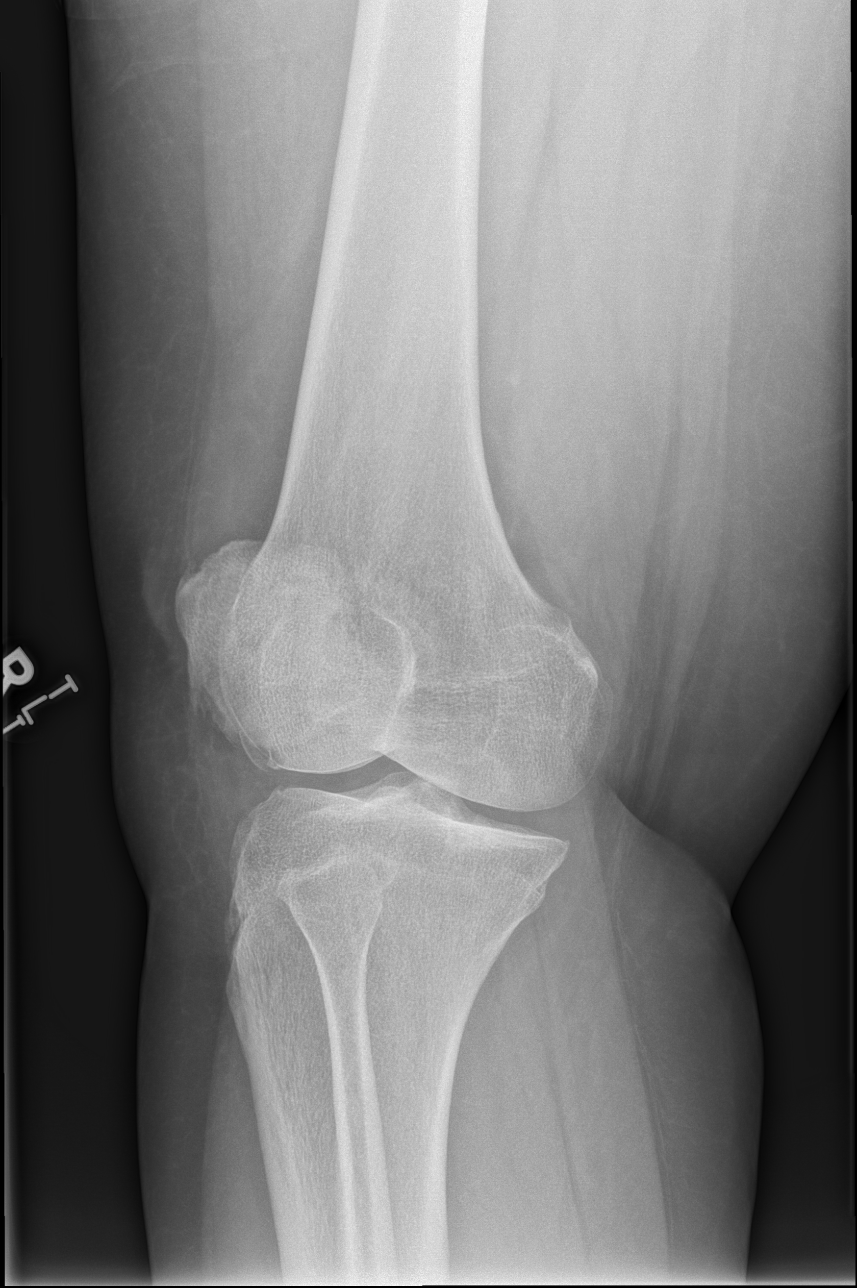

[t knee lat right (1 of 2)]
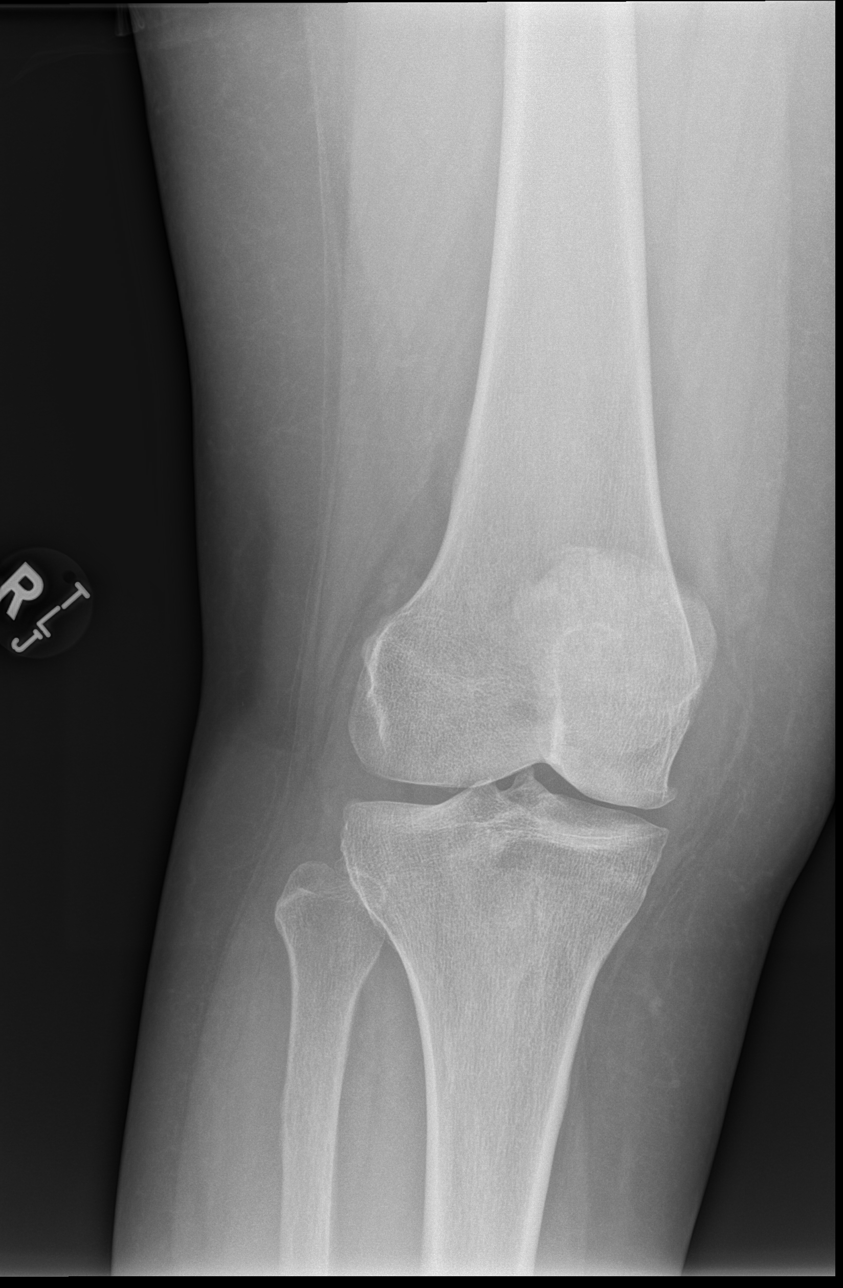

[t knee lat right (2 of 2)]
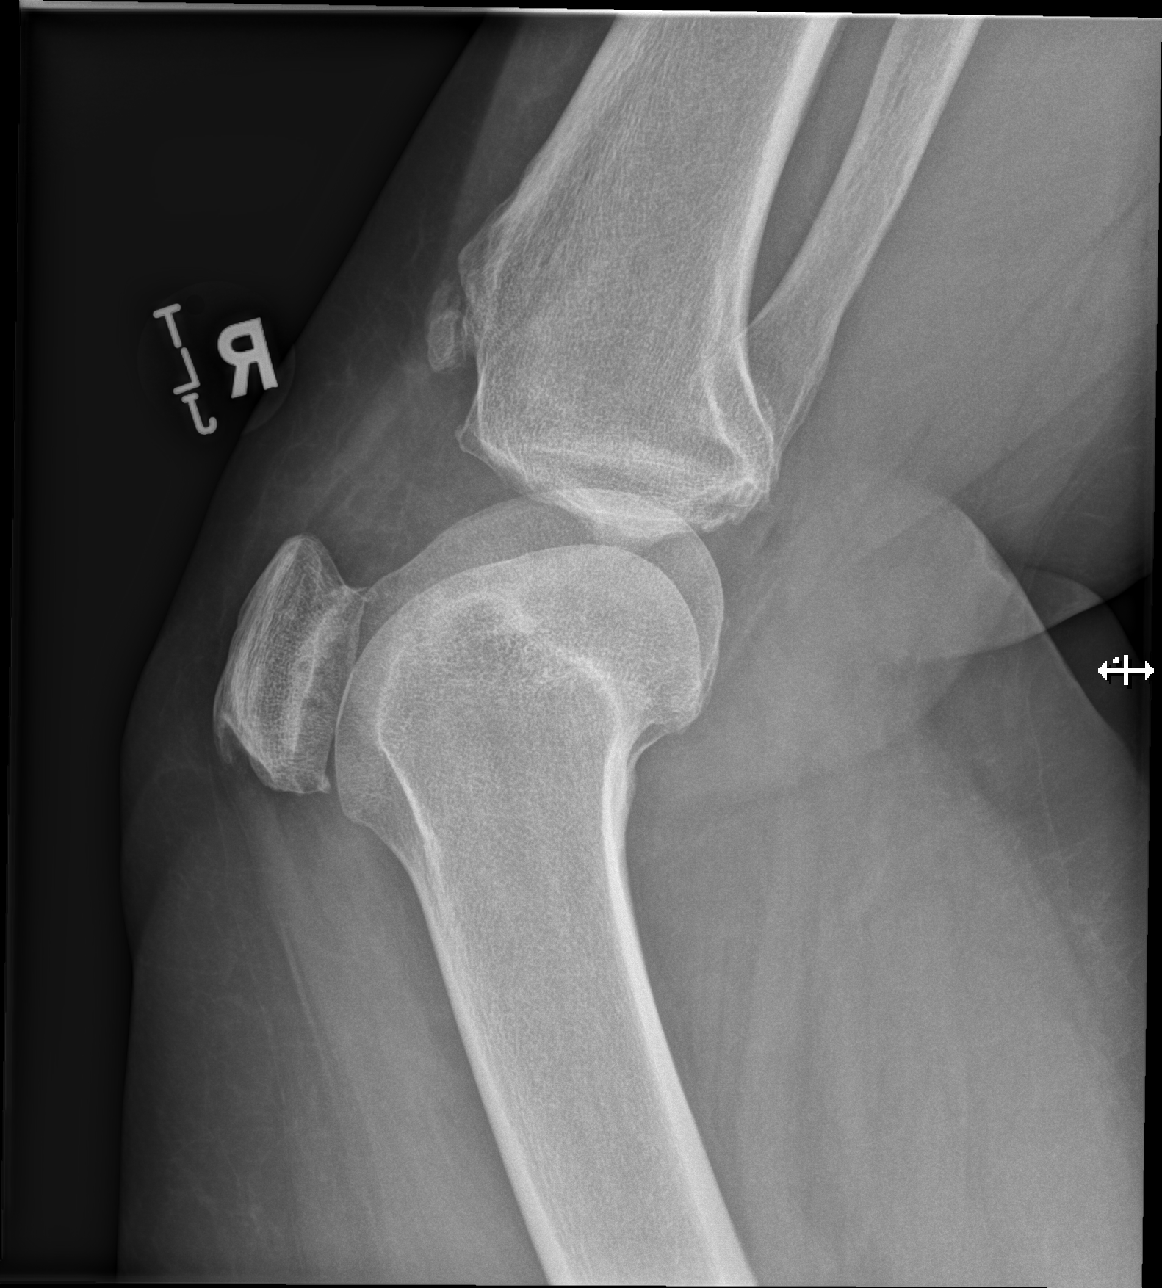

[4 of 4 positions shown; findings below may reference images not displayed]

FINDINGS: No fracture or dislocation of the right knee. Mild tricompartmental
joint space narrowing and osteophytosis. Small, nonspecific knee
joint effusion. Soft tissues are unremarkable.
IMPRESSION: 1. No fracture or dislocation of the right knee. Mild
tricompartmental arthrosis.

2.  Small, nonspecific knee joint effusion.

## 2022-04-07 ENCOUNTER — Encounter: Payer: Self-pay | Admitting: Internal Medicine

## 2022-06-14 ENCOUNTER — Encounter: Payer: Self-pay | Admitting: Internal Medicine

## 2022-07-18 ENCOUNTER — Encounter: Payer: Self-pay | Admitting: Internal Medicine

## 2022-08-07 ENCOUNTER — Other Ambulatory Visit: Payer: Self-pay | Admitting: Physician Assistant

## 2022-08-07 DIAGNOSIS — I10 Essential (primary) hypertension: Secondary | ICD-10-CM

## 2022-08-22 ENCOUNTER — Encounter: Payer: Self-pay | Admitting: Internal Medicine

## 2022-10-20 ENCOUNTER — Ambulatory Visit: Payer: BC Managed Care – PPO | Admitting: Obstetrics & Gynecology

## 2022-10-23 DIAGNOSIS — Z1231 Encounter for screening mammogram for malignant neoplasm of breast: Secondary | ICD-10-CM | POA: Diagnosis not present

## 2022-10-25 ENCOUNTER — Encounter: Payer: Self-pay | Admitting: Obstetrics & Gynecology

## 2022-11-15 ENCOUNTER — Other Ambulatory Visit: Payer: Self-pay | Admitting: Family Medicine

## 2022-11-15 DIAGNOSIS — I10 Essential (primary) hypertension: Secondary | ICD-10-CM

## 2022-11-16 ENCOUNTER — Other Ambulatory Visit: Payer: Self-pay | Admitting: Family Medicine

## 2022-11-16 DIAGNOSIS — I10 Essential (primary) hypertension: Secondary | ICD-10-CM

## 2022-11-24 ENCOUNTER — Encounter: Payer: BC Managed Care – PPO | Admitting: Physician Assistant

## 2022-11-27 ENCOUNTER — Encounter: Payer: Self-pay | Admitting: Nurse Practitioner

## 2022-11-27 ENCOUNTER — Ambulatory Visit: Payer: BC Managed Care – PPO | Admitting: Nurse Practitioner

## 2022-11-27 VITALS — BP 122/80 | HR 68 | Ht 66.25 in | Wt 236.6 lb

## 2022-11-27 DIAGNOSIS — Z23 Encounter for immunization: Secondary | ICD-10-CM

## 2022-11-27 DIAGNOSIS — Z0000 Encounter for general adult medical examination without abnormal findings: Secondary | ICD-10-CM | POA: Diagnosis not present

## 2022-11-27 DIAGNOSIS — H9312 Tinnitus, left ear: Secondary | ICD-10-CM

## 2022-11-27 DIAGNOSIS — M25562 Pain in left knee: Secondary | ICD-10-CM

## 2022-11-27 DIAGNOSIS — E669 Obesity, unspecified: Secondary | ICD-10-CM | POA: Diagnosis not present

## 2022-11-27 DIAGNOSIS — M25561 Pain in right knee: Secondary | ICD-10-CM

## 2022-11-27 DIAGNOSIS — R748 Abnormal levels of other serum enzymes: Secondary | ICD-10-CM

## 2022-11-27 DIAGNOSIS — L609 Nail disorder, unspecified: Secondary | ICD-10-CM

## 2022-11-27 DIAGNOSIS — I10 Essential (primary) hypertension: Secondary | ICD-10-CM

## 2022-11-27 DIAGNOSIS — E559 Vitamin D deficiency, unspecified: Secondary | ICD-10-CM

## 2022-11-27 DIAGNOSIS — G8929 Other chronic pain: Secondary | ICD-10-CM

## 2022-11-27 DIAGNOSIS — Z6837 Body mass index (BMI) 37.0-37.9, adult: Secondary | ICD-10-CM

## 2022-11-27 HISTORY — DX: Encounter for general adult medical examination without abnormal findings: Z00.00

## 2022-11-27 MED ORDER — LOSARTAN POTASSIUM 100 MG PO TABS: 100.0000 mg | ORAL_TABLET | Freq: Every day | ORAL | 3 refills | Status: DC

## 2022-11-27 MED ORDER — AMLODIPINE BESYLATE 10 MG PO TABS: ORAL_TABLET | ORAL | 3 refills | Status: DC

## 2022-11-27 NOTE — Assessment & Plan Note (Signed)

## 2022-11-27 NOTE — Patient Instructions (Signed)
Dear Mia Davis,  Thank you for your visit today. Your proactive approach to health is greatly valued. Below are the instructions and recommendations from our consultation:  1. Continue your prescribed regimen of amlodipine and losartan for blood pressure management. If you notice your blood pressures are running higher or if you start to have symptoms of high blood pressure (headache, vision changes, dizziness, etc) please let me know.  2. we have provided immunizations for the flu and an additional COVID vaccine today.   3. We will complete lab tests to evaluate kidney and heart function, as well as to measure vitamin D and B12 levels.  4. Obtain an X-ray of your knees at [facility] located at [address]. They offer walk-in services for your convenience. I will let you know what these show.   5. Your medication refills have been processed and sent to [facility] at the intersection of [address] and [address]. I have sent in a year of refills for you today.  6. Lifestyle recommendations to support your health:    - Utilize compression stockings as needed for leg swelling     - Ensure you get up and walk around at least once every hour.    - Aim to drink a minimum of 64 ounces of water daily to alleviate constipation.    - Work to get at least 30 minutes of physical activity daily.     - Work to keep your sodium intake low.   7. Take over-the-counter allergy medication (generic Xyzal) at bedtime to assist with nighttime sinus drainage. If this is not helpful for the drainage, please let me know.   Please ensure to keep your scheduled appointment with your gynecologist next month. Should you have any questions or if there are any concerns arising from your lab results, we will reach out to you to discuss the necessary actions.  Wishing you continued health and well-being,  Worthy Keeler, DNP, AGNP-c [facility]  Date: Mon Nov 27, 2022

## 2022-11-27 NOTE — Assessment & Plan Note (Signed)
Patient is currently on amlodipine and losartan with no reported symptoms. Blood pressure is well-controlled. Exam normal today.  Plan: - Continue current medications and monitor blood pressure regularly. - Refill prescriptions as needed. - Labs pending.

## 2022-11-27 NOTE — Progress Notes (Signed)
Worthy Keeler, DNP, AGNP-c [place], [place] [id number] [place] [phone]  BP 122/80   Pulse 68   Ht 5' 6.25" (1.683 m)   Wt 236 lb 9.6 oz (107.3 kg)   LMP 07/13/2016   BMI 37.90 kg/m    Subjective:    Patient ID: Mia Davis, female    DOB: [birth year], 57 y.o.   MRN: [id number]  HPI: Mia Davis is a 57 y.o. female presenting on 11/27/2022 for comprehensive medical examination.  Current medical concerns include: She reports experiencing tinnitus since 2021, noting it becomes more pronounced during quiet periods, but always present..She has been seen by ENT and no abnormalities were noted.   She has a history of knee pain; her right knee underwent surgery for a torn meniscus in 2019, and she also experiences pain in her left knee due to a torn meniscus on 2020. She notes swelling in her legs and feet, particularly when sitting at work during the day. She expresses concern about her knees and legs, which have been causing discomfort for the past two months. She was previously told she has arthritis, but she is concerned the pain is related to the meniscal tears.   She has observed ridges on her nails and questions whether they may be indicative of any health issues.  Over the past year and a half, she has been managing constipation primarily through increased water intake, though she admits to not having a high fiber diet.  IMMUNIZATIONS:   Flu: Flu vaccine given today Prevnar 13: Prevnar 13 N/A for this patient Prevnar 20: Prevnar 20 N/A for this patient Pneumovax 23: Pneumovax 23 N/A for this patient Vac Shingrix: Shingrix completed, documentation in chart (Dose # 2 documentation needed) HPV: HPV N/A for this patient Tetanus: Tetanus completed in the last 10 years  HEALTH MAINTENANCE: Pap Smear HM Status: is up to date Mammogram HM Status: is up to date Colon Cancer Screening HM Status: is up to date Bone Density HM  Status: is not applicable for this patient STI Testing HM Status: was declined   She reports regular vision exams q1-5y: Yes  She reports regular dental exams q 94m  Yes  The patient eats a regular, healthy diet. She endorses exercise and/or activity of:  nothing regular  Pertinent items are noted in HPI.  Most Recent Depression Screen:     11/27/2022    8:32 AM 11/21/2021    2:06 PM 03/11/2021    8:31 AM 08/30/2020    8:33 AM 08/28/2019    9:52 AM  Depression screen PHQ 2/9  Decreased Interest 0 0 0 0 0  Down, Depressed, Hopeless 0 0 0 0 0  PHQ - 2 Score 0 0 0 0 0  Altered sleeping  0     Tired, decreased energy  3     Change in appetite  0     Feeling bad or failure about yourself   0     Trouble concentrating  0     Moving slowly or fidgety/restless  0     Suicidal thoughts  0     PHQ-9 Score  3      Most Recent Anxiety Screen:     08/16/2016    2:40 PM  GAD 7 : Generalized Anxiety Score  Nervous, Anxious, on Edge 0  Control/stop worrying 0  Worry too much - different things 0  Trouble relaxing 0  Restless 0  Easily annoyed or  irritable 0  Afraid - awful might happen 0  Total GAD 7 Score 0   Most Recent Fall Screen:    11/27/2022    8:32 AM 11/21/2021    2:06 PM 03/11/2021    8:31 AM 08/28/2019    9:52 AM 08/22/2018    8:18 AM  Fall Risk   Falls in the past year? 0 0 0 0 0  Number falls in past yr: 0 0 0 0 0  Injury with Fall? 0  0 0 0  Risk for fall due to : No Fall Risks  No Fall Risks    Follow up Falls evaluation completed  Falls evaluation completed      Past medical history, surgical history, medications, allergies, family history and social history reviewed with patient today and changes made to appropriate areas of the chart.  Past Medical History:  Past Medical History:  Diagnosis Date   Acute medial meniscal tear 04/21/2020   Anemia    Colon polyps 01/01/2018   Diverticula of colon 01/01/2018   Encounter for annual physical exam 11/27/2022    Hypertension    Knee locking, right 03/17/2020   Medications:  No current outpatient medications on file prior to visit.   No current facility-administered medications on file prior to visit.   Surgical History:  Past Surgical History:  Procedure Laterality Date   ABDOMINAL HYSTERECTOMY  2017   abdominal    COLONOSCOPY     HYSTERECTOMY ABDOMINAL WITH SALPINGECTOMY Bilateral 09/12/2016   Procedure: HYSTERECTOMY ABDOMINAL WITH SALPINGECTOMY;  Surgeon: Mora Bellman, MD;  Location: [facility];  Service: Gynecology;  Laterality: Bilateral;   KNEE SURGERY Right    POLYPECTOMY     TUBAL LIGATION     Allergies:  No Known Allergies Family History:  Family History  Problem Relation Age of Onset   Diabetes Mother    Hypertension Mother    Diabetes Father    Hypertension Father    Heart attack Father 79   Cancer Maternal Grandmother        pancreatic   Diabetes Sister    Hypertension Sister    Colon cancer Neg Hx    Colon polyps Neg Hx    Esophageal cancer Neg Hx    Stomach cancer Neg Hx    Rectal cancer Neg Hx        Objective:    BP 122/80   Pulse 68   Ht 5' 6.25" (1.683 m)   Wt 236 lb 9.6 oz (107.3 kg)   LMP 07/13/2016   BMI 37.90 kg/m   Wt Readings from Last 3 Encounters:  11/27/22 236 lb 9.6 oz (107.3 kg)  11/21/21 225 lb (102.1 kg)  08/22/21 228 lb 9.6 oz (103.7 kg)    Physical Exam Vitals and nursing note reviewed.  Constitutional:      General: She is not in acute distress.    Appearance: Normal appearance.  HENT:     Head: Normocephalic and atraumatic.     Right Ear: Hearing, tympanic membrane, ear canal and external ear normal.     Left Ear: Hearing, tympanic membrane, ear canal and external ear normal.     Nose: Nose normal.     Right Sinus: No maxillary sinus tenderness or frontal sinus tenderness.     Left Sinus: No maxillary sinus tenderness or frontal sinus tenderness.     Mouth/Throat:     Lips: Pink.     Mouth: Mucous membranes are moist.      Pharynx: Oropharynx  is clear.  Eyes:     General: Lids are normal. Vision grossly intact.     Extraocular Movements: Extraocular movements intact.     Conjunctiva/sclera: Conjunctivae normal.     Pupils: Pupils are equal, round, and reactive to light.     Funduscopic exam:    Right eye: Red reflex present.        Left eye: Red reflex present.    Visual Fields: Right eye visual fields normal and left eye visual fields normal.  Neck:     Thyroid: No thyromegaly.     Vascular: No carotid bruit.  Cardiovascular:     Rate and Rhythm: Normal rate and regular rhythm.     Chest Wall: PMI is not displaced.     Pulses: Normal pulses.          Dorsalis pedis pulses are 2+ on the right side and 2+ on the left side.       Posterior tibial pulses are 2+ on the right side and 2+ on the left side.     Heart sounds: Normal heart sounds. No murmur heard. Pulmonary:     Effort: Pulmonary effort is normal. No respiratory distress.     Breath sounds: Normal breath sounds.  Abdominal:     General: Abdomen is flat. Bowel sounds are normal. There is no distension.     Palpations: Abdomen is soft. There is no hepatomegaly, splenomegaly or mass.     Tenderness: There is no abdominal tenderness. There is no right CVA tenderness, left CVA tenderness, guarding or rebound.  Musculoskeletal:        General: Tenderness present. Normal range of motion.     Cervical back: Full passive range of motion without pain, normal range of motion and neck supple. No tenderness.     Right lower leg: No edema.     Left lower leg: No edema.     Comments: Tenderness to the medial portion of the right knee with no acute inflammatory process present. Crepitus is present with movement. No joint laxity.   Feet:     Left foot:     Toenail Condition: Left toenails are normal.  Lymphadenopathy:     Cervical: No cervical adenopathy.     Upper Body:     Right upper body: No supraclavicular adenopathy.     Left upper body: No  supraclavicular adenopathy.  Skin:    General: Skin is warm and dry.     Capillary Refill: Capillary refill takes less than 2 seconds.     Nails: There is no clubbing.     Comments: Vertical ridges in her fingernails present bilaterally.   Neurological:     General: No focal deficit present.     Mental Status: She is alert and oriented to person, place, and time.     GCS: GCS eye subscore is 4. GCS verbal subscore is 5. GCS motor subscore is 6.     Sensory: Sensation is intact.     Motor: Motor function is intact.     Coordination: Coordination is intact.     Gait: Gait is intact.     Deep Tendon Reflexes: Reflexes are normal and symmetric.  Psychiatric:        Attention and Perception: Attention normal.        Mood and Affect: Mood normal.        Speech: Speech normal.        Behavior: Behavior normal. Behavior is cooperative.  Thought Content: Thought content normal.        Cognition and Memory: Cognition and memory normal.        Judgment: Judgment normal.     Results for orders placed or performed in visit on 11/21/21  Lipid panel  Result Value Ref Range   Cholesterol, Total 156 100 - 199 mg/dL   Triglycerides 87 0 - 149 mg/dL   HDL 46 >39 mg/dL   VLDL Cholesterol Cal 16 5 - 40 mg/dL   LDL Chol Calc (NIH) 94 0 - 99 mg/dL   Chol/HDL Ratio 3.4 0.0 - 4.4 ratio         Assessment & Plan:   Problem List Items Addressed This Visit     Essential hypertension    Patient is currently on amlodipine and losartan with no reported symptoms. Blood pressure is well-controlled. Exam normal today.  Plan: - Continue current medications and monitor blood pressure regularly. - Refill prescriptions as needed. - Labs pending.       Relevant Medications   losartan (COZAAR) 100 MG tablet   amLODipine (NORVASC) 10 MG tablet   Other Relevant Orders   CBC with Differential/Platelet   Comprehensive metabolic panel   Lipid panel   Hemoglobin A1c   VITAMIN D 25 Hydroxy (Vit-D  Deficiency, Fractures)   B12 and Folate Panel   BMI 37.0-37.9, adult    BMI 37.90 in office today.  Plan: - Will assess labs today for evaluation and monitoring.  - Recommend healthy dietary options and daily physical activity for optimal health.       Tinnitus of left ear    Patient reports chronic ringing in the ears since 2021. Symptoms are not worsening at this time, but present daily. No alarm symptoms present. Exam benign.  Plan: - Reassure the patient that tinnitus is often chronic. - No specific intervention needed at this time. - Recommend continued use of white noise at night to help with sleep and monitor levels of earphones and sound exposure.  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 No alarm symptoms present at this time.  Plan: - Repeat labs today for monitoring.       Relevant Orders   CBC with Differential/Platelet   Comprehensive metabolic panel   Lipid panel   Hemoglobin A1c   VITAMIN D 25 Hydroxy (Vit-D Deficiency, Fractures)   B12 and Folate Panel   Chronic pain of both knees    Patient reports ongoing pain in both knees, with a history of meniscal tears and surgery on the right knee. Exam today shows no laxity in either knee. There is medial tenderness on palpation of the right knee present. Imaging reviewed from previous injury with evidence of arthritic changes present. Suspect this is contributing to current symptoms.  Plan: - Order x-rays of both knees to assess for worsening arthritis or other issues. - Consider referral to orthopedics for further evaluation and management.      Encounter for annual physical exam - Primary    CPE today with no abnormalities noted on  exam.  Labs pending. Will make changes as necessary based on results.  Review of HM activities and recommendations  discussed and provided on AVS Anticipatory guidance, diet, and exercise recommendations provided.  Medications, allergies, and hx reviewed and updated as necessary.  Plan to f/u with CPE in 1 year or sooner for acute/chronic health needs as directed.        Relevant Orders   CBC with Differential/Platelet   Comprehensive metabolic panel   Lipid panel   Hemoglobin A1c   VITAMIN D 25 Hydroxy (Vit-D Deficiency, Fractures)   B12 and Folate Panel   Other Visit Diagnoses     Need for COVID-19 vaccine       Relevant Orders   Pfizer Fall 2023 Covid-19 Vaccine 51yr and older (Completed)   Needs flu shot       Relevant Orders   Flu Vaccine QUAD 6+ mos PF IM (Fluarix Quad PF) (Completed)   Health care maintenance       Relevant Orders   CBC with Differential/Platelet   Comprehensive metabolic panel   Lipid panel   Hemoglobin A1c   VITAMIN D 25 Hydroxy (Vit-D Deficiency, Fractures)   B12 and Folate Panel   Ridged nails       Relevant Orders   CBC with Differential/Platelet   Comprehensive metabolic panel   Lipid panel   Hemoglobin A1c   VITAMIN D 25 Hydroxy (Vit-D Deficiency, Fractures)   B12 and Folate Panel   Chronic pain of right knee       Relevant Orders   DG Knee Complete 4 Views Right          Follow up plan: Return in about 1 year (around 11/28/2023) for CPE.  NEXT PREVENTATIVE PHYSICAL DUE IN 1 YEAR.  PATIENT COUNSELING PROVIDED FOR ALL ADULT PATIENTS:  Consume a well balanced diet low in saturated fats, cholesterol, and moderation in carbohydrates.   This can be as simple as monitoring portion sizes and cutting back on sugary beverages such as soda and juice to start with.    Daily water consumption of at least 64 ounces.  Physical activity at least 180 minutes per week, if just starting out.   This can be as simple as taking the stairs  instead of the elevator and walking 2-3 laps around the office  purposefully every day.   STD protection, partner selection, and regular testing if high risk.  Limited consumption of alcoholic beverages if alcohol is consumed.  For women, I recommend no more than 7 alcoholic beverages per week, spread out throughout the week.  Avoid "binge" drinking or consuming large quantities of alcohol in one setting.   Please let me know if you feel you may need help with reduction or quitting alcohol consumption.   Avoidance of nicotine, if used.  Please let me know if you feel you may need help with reduction or quitting nicotine use.   Daily mental health attention.  This can be in the form of 5 minute daily meditation, prayer, journaling, yoga, reflection, etc.   Purposeful attention to your emotions and mental state can significantly improve your overall wellbeing and Health.  Please know that I am here to help you with all of your health care goals and am happy to work with you to find a solution that works best for you.  The greatest advice I have received with any changes in life are to take it one step at a time, that even means if all you can focus on is the next 60 seconds, then do that and celebrate your victories.  With any changes in life,  you will have set backs, and that is OK. The important thing to remember is, if you have a set back, it is not a failure, it is an opportunity to try again!  Health Maintenance Recommendations Screening Testing Mammogram Every 1 -2 years based on history and risk factors Starting at age 22 Pap Smear Ages 21-39 every 3 years Ages 61-65 every 5 years with HPV testing More frequent testing may be required based on results and history Colon Cancer Screening Every 1-10 years based on test performed, risk factors, and history Starting at age 67 Bone Density Screening Every 2-10 years based on history Starting at age 31 for women Recommendations for  men differ based on medication usage, history, and risk factors AAA Screening One time ultrasound Men 33-53 years old who have every smoked Lung Cancer Screening Low Dose Lung CT every 12 months Age 70-80 years with a 30 pack-year smoking history who still smoke or who have quit within the last 15 years  Screening Labs Routine  Labs: Complete Blood Count (CBC), Complete Metabolic Panel (CMP), Cholesterol (Lipid Panel) Every 6-12 months based on history and medications May be recommended more frequently based on current conditions or previous results Hemoglobin A1c Lab Every 3-12 months based on history and previous results Starting at age 13 or earlier with diagnosis of diabetes, high cholesterol, BMI >26, and/or risk factors Frequent monitoring for patients with diabetes to ensure blood sugar control Thyroid Panel (TSH w/ T3 & T4) Every 6 months based on history, symptoms, and risk factors May be repeated more often if on medication HIV One time testing for all patients 69 and older May be repeated more frequently for patients with increased risk factors or exposure Hepatitis C One time testing for all patients 110 and older May be repeated more frequently for patients with increased risk factors or exposure Gonorrhea, Chlamydia Every 12 months for all sexually active persons 13-24 years Additional monitoring may be recommended for those who are considered high risk or who have symptoms PSA Men 48-5 years old with risk factors Additional screening may be recommended from age 59-69 based on risk factors, symptoms, and history  Vaccine Recommendations Tetanus Booster All adults every 10 years Flu Vaccine All patients 6 months and older every year COVID Vaccine All patients 12 years and older Initial dosing with booster May recommend additional booster based on age and health history HPV Vaccine 2 doses all patients age 3-26 Dosing may be considered for patients over  26 Shingles Vaccine (Shingrix) 2 doses all adults 67 years and older Pneumonia (Pneumovax 23) All adults 37 years and older May recommend earlier dosing based on health history Pneumonia (Prevnar 20) All adults 6 years and older Dosed 1 year after Pneumovax 23  Additional Screening, Testing, and Vaccinations may be recommended on an individualized basis based on family history, health history, risk factors, and/or exposure.

## 2022-11-27 NOTE — Assessment & Plan Note (Addendum)
BMI 37.90 in office today.  Plan: - Will assess labs today for evaluation and monitoring.  - Recommend healthy dietary options and daily physical activity for optimal health.

## 2022-11-27 NOTE — Assessment & Plan Note (Addendum)
Patient reports chronic ringing in the ears since 2021. Symptoms are not worsening at this time, but present daily. No alarm symptoms present. Exam benign.  Plan: - Reassure the patient that tinnitus is often chronic. - No specific intervention needed at this time. - Recommend continued use of white noise at night to help with sleep and monitor levels of earphones and sound exposure.  - Continue to monitor and if symptoms worsen or change, recommend follow-up

## 2022-11-27 NOTE — Assessment & Plan Note (Signed)
>>  ASSESSMENT AND PLAN FOR BMI 37.0-37.9, ADULT WRITTEN ON 11/27/2022  6:02 PM BY Donne Baley E, NP  BMI 37.90 in office today.  Plan: - Will assess labs today for evaluation and monitoring.  - Recommend healthy dietary options and daily physical activity for optimal health.

## 2022-11-27 NOTE — Assessment & Plan Note (Signed)
Chronic. No alarm symptoms present at this time.  Plan: - Repeat labs today for monitoring.

## 2022-11-27 NOTE — Assessment & Plan Note (Signed)
Patient reports ongoing pain in both knees, with a history of meniscal tears and surgery on the right knee. Exam today shows no laxity in either knee. There is medial tenderness on palpation of the right knee present. Imaging reviewed from previous injury with evidence of arthritic changes present. Suspect this is contributing to current symptoms.  Plan: - Order x-rays of both knees to assess for worsening arthritis or other issues. - Consider referral to orthopedics for further evaluation and management.

## 2022-11-28 LAB — COMPREHENSIVE METABOLIC PANEL
ALT: 15 IU/L (ref 0–32)
AST: 15 IU/L (ref 0–40)
Albumin/Globulin Ratio: 2 (ref 1.2–2.2)
Albumin: 4.5 g/dL (ref 3.8–4.9)
Alkaline Phosphatase: 88 IU/L (ref 44–121)
BUN/Creatinine Ratio: 14 (ref 9–23)
BUN: 13 mg/dL (ref 6–24)
Bilirubin Total: 0.7 mg/dL (ref 0.0–1.2)
CO2: 21 mmol/L (ref 20–29)
Calcium: 9.2 mg/dL (ref 8.7–10.2)
Chloride: 106 mmol/L (ref 96–106)
Creatinine, Ser: 0.93 mg/dL (ref 0.57–1.00)
Globulin, Total: 2.2 g/dL (ref 1.5–4.5)
Glucose: 104 mg/dL — ABNORMAL HIGH (ref 70–99)
Potassium: 4.1 mmol/L (ref 3.5–5.2)
Sodium: 139 mmol/L (ref 134–144)
Total Protein: 6.7 g/dL (ref 6.0–8.5)
eGFR: 71 mL/min/{1.73_m2} (ref >59)

## 2022-11-28 LAB — CBC WITH DIFFERENTIAL/PLATELET
Basophils Absolute: 0 10*3/uL (ref 0.0–0.2)
Basos: 0 %
EOS (ABSOLUTE): 0.2 10*3/uL (ref 0.0–0.4)
Eos: 3 %
Hematocrit: 43.4 % (ref 34.0–46.6)
Hemoglobin: 14.3 g/dL (ref 11.1–15.9)
Immature Grans (Abs): 0 10*3/uL (ref 0.0–0.1)
Immature Granulocytes: 0 %
Lymphocytes Absolute: 2.2 10*3/uL (ref 0.7–3.1)
Lymphs: 37 %
MCH: 26 pg — ABNORMAL LOW (ref 26.6–33.0)
MCHC: 32.9 g/dL (ref 31.5–35.7)
MCV: 79 fL (ref 79–97)
Monocytes Absolute: 0.4 10*3/uL (ref 0.1–0.9)
Monocytes: 6 %
Neutrophils Absolute: 3 10*3/uL (ref 1.4–7.0)
Neutrophils: 54 %
Platelets: 276 10*3/uL (ref 150–450)
RBC: 5.49 x10E6/uL — ABNORMAL HIGH (ref 3.77–5.28)
RDW: 14.6 % (ref 11.7–15.4)
WBC: 5.8 10*3/uL (ref 3.4–10.8)

## 2022-11-28 LAB — LIPID PANEL
Chol/HDL Ratio: 2.8 ratio (ref 0.0–4.4)
Cholesterol, Total: 136 mg/dL (ref 100–199)
HDL: 49 mg/dL (ref >39)
LDL Chol Calc (NIH): 73 mg/dL (ref 0–99)
Triglycerides: 71 mg/dL (ref 0–149)
VLDL Cholesterol Cal: 14 mg/dL (ref 5–40)

## 2022-11-28 LAB — VITAMIN D 25 HYDROXY (VIT D DEFICIENCY, FRACTURES): Vit D, 25-Hydroxy: 8.5 ng/mL — ABNORMAL LOW (ref 30.0–100.0)

## 2022-11-28 LAB — B12 AND FOLATE PANEL
Folate: 4.8 ng/mL (ref >3.0)
Vitamin B-12: 604 pg/mL (ref 232–1245)

## 2022-11-28 LAB — HEMOGLOBIN A1C
Est. average glucose Bld gHb Est-mCnc: 117 mg/dL
Hgb A1c MFr Bld: 5.7 % — ABNORMAL HIGH (ref 4.8–5.6)

## 2022-11-29 MED ORDER — CHOLECALCIFEROL 1.25 MG (50000 UT) PO CAPS: 50000.0000 [IU] | ORAL_CAPSULE | ORAL | 1 refills | Status: DC

## 2022-11-29 NOTE — Addendum Note (Signed)
Addended by: Teaghan Melrose, Clarise Cruz E on: 11/29/2022 11:26 AM   Modules accepted: Orders

## 2022-12-19 ENCOUNTER — Encounter: Payer: Self-pay | Admitting: Obstetrics & Gynecology

## 2022-12-25 ENCOUNTER — Encounter: Payer: Self-pay | Admitting: Obstetrics & Gynecology

## 2022-12-25 ENCOUNTER — Ambulatory Visit (INDEPENDENT_AMBULATORY_CARE_PROVIDER_SITE_OTHER): Payer: BC Managed Care – PPO | Admitting: Obstetrics & Gynecology

## 2022-12-25 VITALS — BP 118/82 | HR 71 | Ht 65.5 in | Wt 235.0 lb

## 2022-12-25 DIAGNOSIS — N904 Leukoplakia of vulva: Secondary | ICD-10-CM

## 2022-12-25 DIAGNOSIS — Z90710 Acquired absence of both cervix and uterus: Secondary | ICD-10-CM

## 2022-12-25 DIAGNOSIS — Z01419 Encounter for gynecological examination (general) (routine) without abnormal findings: Secondary | ICD-10-CM | POA: Diagnosis not present

## 2022-12-25 DIAGNOSIS — Z780 Asymptomatic menopausal state: Secondary | ICD-10-CM | POA: Diagnosis not present

## 2022-12-25 MED ORDER — CLOBETASOL PROPIONATE 0.05 % EX OINT: 1.0000 | TOPICAL_OINTMENT | CUTANEOUS | 4 refills | Status: DC

## 2022-12-25 NOTE — Progress Notes (Signed)
Mia Davis [birth year] [id number]   History:    57 y.o.  G2P2L2 Longterm boyfriend.  Twins in their 23's.  Youngest son died at 32 yo.   RP:  Established patient presenting for annual gyn exam    HPI: S/P TAH/Bilateral Salpingectomy in 2017 for Fibroids.  No pelvic pain.  C/O perineal and perianal irritation as she ran out of Clobetasol ointment for her Lichen Sclerosus.  No hot flushes/night sweats.  Currently abstinent. Pap Neg 10/2019.  No indication to repeat a Pap at this time.  Urine normal. BMs constipation.  Breasts normal. Mammo 10/2022 Neg.  BMI improved to 38.51. Restarted to walk recently.  Recommend NOOM. Health Labs with Fam MD.  On Losartan for cHTN. Colono-06/21/2021- Due 06/2026.  Past medical history,surgical history, family history and social history were all reviewed and documented in the EPIC chart.  Gynecologic History Patient's last menstrual period was 07/13/2016.  Obstetric History OB History  Gravida Para Term Preterm AB Living  2 2       3   SAB IAB Ectopic Multiple Live Births        1      # Outcome Date GA Lbr Len/2nd Weight Sex Delivery Anes PTL Lv  2 Para           1 Para              ROS: A ROS was performed and pertinent positives and negatives are included in the history. GENERAL: No fevers or chills. HEENT: No change in vision, no earache, sore throat or sinus congestion. NECK: No pain or stiffness. CARDIOVASCULAR: No chest pain or pressure. No palpitations. PULMONARY: No shortness of breath, cough or wheeze. GASTROINTESTINAL: No abdominal pain, nausea, vomiting or diarrhea, melena or bright red blood per rectum. GENITOURINARY: No urinary frequency, urgency, hesitancy or dysuria. MUSCULOSKELETAL: No joint or muscle pain, no back pain, no recent trauma. DERMATOLOGIC: No rash, no itching, no lesions. ENDOCRINE: No polyuria, polydipsia, no heat or cold intolerance. No recent change in weight. HEMATOLOGICAL: No anemia or easy bruising or bleeding.  NEUROLOGIC: No headache, seizures, numbness, tingling or weakness. PSYCHIATRIC: No depression, no loss of interest in normal activity or change in sleep pattern.     Exam:   BP 118/82   Pulse 71   Ht 5' 5.5" (1.664 m)   Wt 235 lb (106.6 kg)   LMP 07/13/2016   SpO2 97%   BMI 38.51 kg/m   Body mass index is 38.51 kg/m.  General appearance : Well developed well nourished female. No acute distress HEENT: Eyes: no retinal hemorrhage or exudates,  Neck supple, trachea midline, no carotid bruits, no thyroidmegaly Lungs: Clear to auscultation, no rhonchi or wheezes, or rib retractions  Heart: Regular rate and rhythm, no murmurs or gallops Breast:Examined in sitting and supine position were symmetrical in appearance, no palpable masses or tenderness,  no skin retraction, no nipple inversion, no nipple discharge, no skin discoloration, no axillary or supraclavicular lymphadenopathy Abdomen: no palpable masses or tenderness, no rebound or guarding Extremities: no edema or skin discoloration or tenderness  Pelvic: Vulva: White atrophy with inflammation at posterior vulva/Perineum and perianally             Vagina: No gross lesions or discharge  Cervix/Uterus absent  Adnexa  Without masses or tenderness   Assessment/Plan:  57 y.o. female for annual exam   1. Well female exam with routine gynecological exam S/P TAH/Bilateral Salpingectomy in 2017 for Fibroids.  No pelvic  pain.  C/O perineal and perianal irritation as she ran out of Clobetasol ointment for her Lichen Sclerosus.  Postmenopause, well on no HRT.  No hot flushes/night sweats.  Currently abstinent. Pap Neg 10/2019.  No indication to repeat a Pap at this time.  Urine normal. BMs constipation.  Breasts normal. Mammo 10/2022 Neg.  BMI improved to 38.51. Restarted to walk recently.  Recommend NOOM. Health Labs with Fam MD.  On Losartan for cHTN. Colono-06/21/2021- Due 06/2026.  2. S/P TAH (total abdominal hysterectomy)  3.  Postmenopause Postmenopause, well on no HRT.    4. Lichen sclerosus et atrophicus of the vulva and perianal area C/O perineal and perianal irritation as she ran out of Clobetasol ointment for her Lichen Sclerosus.    5. Class 2 severe obesity due to excess calories with serious comorbidity and body mass index (BMI) of 38.0 to 38.9 in adult (HCC) BMI improved to 38.51. Restarted to walk recently.  Recommend NOOM.   Other orders - clobetasol ointment (TEMOVATE) 0.05 %; Apply 1 Application topically 2 (two) times a week. Thin application at the involve vulva and perianal area.   Princess Bruins MD, 4:06 PM

## 2023-05-28 ENCOUNTER — Ambulatory Visit (INDEPENDENT_AMBULATORY_CARE_PROVIDER_SITE_OTHER): Payer: BC Managed Care – PPO | Admitting: Nurse Practitioner

## 2023-05-28 ENCOUNTER — Encounter: Payer: Self-pay | Admitting: Nurse Practitioner

## 2023-05-28 VITALS — BP 124/78 | HR 82 | Wt 244.4 lb

## 2023-05-28 DIAGNOSIS — R7303 Prediabetes: Secondary | ICD-10-CM | POA: Diagnosis not present

## 2023-05-28 DIAGNOSIS — Z6841 Body Mass Index (BMI) 40.0 and over, adult: Secondary | ICD-10-CM | POA: Diagnosis not present

## 2023-05-28 DIAGNOSIS — R748 Abnormal levels of other serum enzymes: Secondary | ICD-10-CM | POA: Diagnosis not present

## 2023-05-28 DIAGNOSIS — R7989 Other specified abnormal findings of blood chemistry: Secondary | ICD-10-CM | POA: Diagnosis not present

## 2023-05-28 DIAGNOSIS — E559 Vitamin D deficiency, unspecified: Secondary | ICD-10-CM

## 2023-05-28 DIAGNOSIS — I10 Essential (primary) hypertension: Secondary | ICD-10-CM | POA: Diagnosis not present

## 2023-05-28 MED ORDER — CHOLECALCIFEROL 1.25 MG (50000 UT) PO CAPS: 50000.0000 [IU] | ORAL_CAPSULE | ORAL | 1 refills | Status: AC

## 2023-05-28 MED ORDER — ZEPBOUND 2.5 MG/0.5ML ~~LOC~~ SOAJ: 2.5000 mg | SUBCUTANEOUS | 0 refills | Status: DC

## 2023-05-28 NOTE — Patient Instructions (Addendum)
We will try to get approval from your insurance for coverage for the Zepbound.   You can take a stool softener (Colace/Docusate Sodium) to help if you notice constipation with the medication.   WEIGHT LOSS PLANNING  For best management of weight, it is vital to balance intake versus output. This means the number of calories burned per day must be less than the calories you take in with food and drink.   I recommend trying to follow a diet with the following: Calories: 1200-1500 calories per day Carbohydrates: 150-180 grams of carbohydrates per day  Why: Gives your body enough "quick fuel" for cells to maintain normal function without sending them into starvation mode.  Protein: At least 90 grams of protein per day- 30 grams with each meal Why: Protein takes longer and uses more energy than carbohydrates to break down for fuel. The carbohydrates in your meals serves as quick energy sources and proteins help use some of that extra quick energy to break down to produce long term energy. This helps you not feel hungry as quickly and protein breakdown burns calories.  Water: Drink AT LEAST 64 ounces of water per day  Why: Water is essential to healthy metabolism. Water helps to fill the stomach and keep you fuller longer. Water is required for healthy digestion and filtering of waste in the body.  Fat: Limit fats in your diet- when choosing fats, choose foods with lower fats content such as lean meats (chicken, fish, [place]).  Why: Increased fat intake leads to storage "for later". Once you burn your carbohydrate energy, your body goes into fat and protein breakdown mode to help you loose weight.  Cholesterol: Fats and oils that are LIQUID at room temperature are best. Choose vegetable oils (olive oil, avocado oil, nuts). Avoid fats that are SOLID at room temperature (animal fats, processed meats). Healthy fats are often found in whole grains, beans, nuts, seeds, and berries.  Why: Elevated cholesterol  levels lead to build up of cholesterol on the inside of your blood vessels. This will eventually cause the blood vessels to become hard and can lead to high blood pressure and damage to your organs. When the blood flow is reduced, but the pressure is high from cholesterol buildup, parts of the cholesterol can break off and form clots that can go to the brain or heart leading to a stroke or heart attack.  Fiber: Increase amount of SOLUBLE the fiber in your diet. This helps to fill you up, lowers cholesterol, and helps with digestion. Some foods high in soluble fiber are oats, peas, beans, apples, carrots, barley, and citrus fruits.   Why: Fiber fills you up, helps remove excess cholesterol, and aids in healthy digestion which are all very important in weight management.   I recommend the following as a minimum activity routine: Purposeful walk or other physical activity at least 20 minutes every single day. This means purposefully taking a walk, jog, bike, swim, treadmill, elliptical, dance, etc.  This activity should be ABOVE your normal daily activities, such as walking at work. Goal exercise should be at least 150 minutes a week- work your way up to this.   Heart Rate: Your maximum exercise heart rate should be 220 - Your Age in Years. When exercising, get your heart rate up, but avoid going over the maximum targeted heart rate.  60-70% of your maximum heart rate is where you tend to burn the most fat. To find this number:  76 - Age In  Years= Max HR  Max HR x 0.6 (or 0.7) = Fat Burning HR The Fat Burning HR is your goal heart rate while working out to burn the most fat.  NEVER exercise to the point your feel lightheaded, weak, nauseated, dizzy. If you experience ANY of these symptoms- STOP exercise! Allow yourself to cool down and your heart rate to come down. Then restart slower next time.  If at ANY TIME you feel chest pain or chest pressure during exercise, STOP IMMEDIATELY and seek medical  attention.

## 2023-05-28 NOTE — Assessment & Plan Note (Signed)
Diagnosis with last visit. She is interested in medication to help lower her risks. She would first like to try GLP-1, but is open to considering metformin if this is not covered. We will reassess labs and send medication for PA today.

## 2023-05-28 NOTE — Progress Notes (Signed)
Shawna Clamp, DNP, AGNP-c [facility]  [address] [place], [place] [address] [phone]  ESTABLISHED PATIENT- Chronic Health and/or Follow-Up Visit  Blood pressure 124/78, pulse 82, weight 244 lb 6.4 oz (110.9 kg), last menstrual period 07/13/2016.    Mia Davis is a 57 y.o. year old female presenting today for evaluation and management of chronic conditions.   HTN Her BP is well controlled. No symptoms of HA, vision changes, shortness of breath, CP, dizziness. She is tolerating the medication well. No LE edema noted.   PreDM She tells me she is hungry all of the time. Does skip meals. Feels like protein doesn't sustain her when she eats with meals. She is concerned about her weight and the affect this is having on her health. She would like to consider medication management.   Weight She has done weight watchers in the past more than once, but she gained this weight back once she stopped this. She is concerned about her risks of diabetes. She is working on changing her diet and portion sizes. We discussed ways to best manage this. We also discussed activity increase with walking daily. She would like to try GLP-1.   All ROS negative with exception of what is listed above.   PHYSICAL EXAM Physical Exam Vitals and nursing note reviewed.  Constitutional:      Appearance: Normal appearance.  HENT:     Head: Normocephalic.  Eyes:     Pupils: Pupils are equal, round, and reactive to light.  Neck:     Vascular: No carotid bruit.  Cardiovascular:     Rate and Rhythm: Normal rate and regular rhythm.     Pulses: Normal pulses.     Heart sounds: Normal heart sounds.  Pulmonary:     Effort: Pulmonary effort is normal.     Breath sounds: Normal breath sounds.  Musculoskeletal:        General: Normal range of motion.     Cervical back: Normal range of motion.  Skin:    General: Skin is warm.  Neurological:     General: No focal deficit present.      Mental Status: She is alert and oriented to person, place, and time.  Psychiatric:        Mood and Affect: Mood normal.     PLAN Problem List Items Addressed This Visit     Essential hypertension - Primary    Currently well controlled with no alarm symptoms present. Labs pending. Continue current medication and management.       Relevant Medications   tirzepatide (ZEPBOUND) 2.5 MG/0.5ML Pen   Other Relevant Orders   CBC with Differential/Platelet   Hemoglobin A1c   Comprehensive metabolic panel   Pre-diabetes    Diagnosis with last visit. She is interested in medication to help lower her risks. She would first like to try GLP-1, but is open to considering metformin if this is not covered. We will reassess labs and send medication for PA today.       Relevant Medications   tirzepatide (ZEPBOUND) 2.5 MG/0.5ML Pen   Other Relevant Orders   Hemoglobin A1c   Comprehensive metabolic panel   Vitamin D deficiency   Relevant Medications   Cholecalciferol 1.25 MG (50000 UT) capsule   Other Relevant Orders   VITAMIN D 25 Hydroxy (Vit-D Deficiency, Fractures)   BMI 40.0-44.9, adult (HCC)    BMI 40 in the setting of pre-DM and HTN. At this time her risks are increased of further  complications associated with obesity. She has tried other options in the past and did have success, but gained the weight back as soon as she stopped with counting calories and preparing separate meals for herself. She would like to try GLP-1 medication for reduction of her risks and management. We will send this in today and monitor.       Relevant Medications   tirzepatide (ZEPBOUND) 2.5 MG/0.5ML Pen   Other Relevant Orders   CBC with Differential/Platelet   Hemoglobin A1c   Comprehensive metabolic panel   Low serum high density lipoprotein (HDL)   Relevant Medications   tirzepatide (ZEPBOUND) 2.5 MG/0.5ML Pen   Elevated serum creatinine   Relevant Medications   tirzepatide (ZEPBOUND) 2.5 MG/0.5ML Pen    Other Relevant Orders   Comprehensive metabolic panel    Return in about 3 months (around 08/28/2023) for virtual med check , Med Management 30.    Shawna Clamp, DNP, AGNP-c

## 2023-05-28 NOTE — Assessment & Plan Note (Signed)
BMI 40 in the setting of pre-DM and HTN. At this time her risks are increased of further complications associated with obesity. She has tried other options in the past and did have success, but gained the weight back as soon as she stopped with counting calories and preparing separate meals for herself. She would like to try GLP-1 medication for reduction of her risks and management. We will send this in today and monitor.

## 2023-05-28 NOTE — Assessment & Plan Note (Signed)
Currently well controlled with no alarm symptoms present. Labs pending. Continue current medication and management.

## 2023-05-29 LAB — CBC WITH DIFFERENTIAL/PLATELET
Basophils Absolute: 0 10*3/uL (ref 0.0–0.2)
Basos: 0 %
EOS (ABSOLUTE): 0.2 10*3/uL (ref 0.0–0.4)
Eos: 3 %
Hematocrit: 45.9 % (ref 34.0–46.6)
Hemoglobin: 14.8 g/dL (ref 11.1–15.9)
Immature Grans (Abs): 0 10*3/uL (ref 0.0–0.1)
Immature Granulocytes: 0 %
Lymphocytes Absolute: 2.9 10*3/uL (ref 0.7–3.1)
Lymphs: 36 %
MCH: 25.8 pg — ABNORMAL LOW (ref 26.6–33.0)
MCHC: 32.2 g/dL (ref 31.5–35.7)
MCV: 80 fL (ref 79–97)
Monocytes Absolute: 0.5 10*3/uL (ref 0.1–0.9)
Monocytes: 6 %
Neutrophils Absolute: 4.3 10*3/uL (ref 1.4–7.0)
Neutrophils: 55 %
Platelets: 303 10*3/uL (ref 150–450)
RBC: 5.73 x10E6/uL — ABNORMAL HIGH (ref 3.77–5.28)
RDW: 14.8 % (ref 11.7–15.4)
WBC: 8 10*3/uL (ref 3.4–10.8)

## 2023-05-29 LAB — COMPREHENSIVE METABOLIC PANEL
ALT: 20 IU/L (ref 0–32)
AST: 20 IU/L (ref 0–40)
Albumin: 4.4 g/dL (ref 3.8–4.9)
Alkaline Phosphatase: 99 IU/L (ref 44–121)
BUN/Creatinine Ratio: 14 (ref 9–23)
BUN: 16 mg/dL (ref 6–24)
Bilirubin Total: 0.8 mg/dL (ref 0.0–1.2)
CO2: 22 mmol/L (ref 20–29)
Calcium: 9.4 mg/dL (ref 8.7–10.2)
Chloride: 104 mmol/L (ref 96–106)
Creatinine, Ser: 1.16 mg/dL — ABNORMAL HIGH (ref 0.57–1.00)
Globulin, Total: 2.7 g/dL (ref 1.5–4.5)
Glucose: 122 mg/dL — ABNORMAL HIGH (ref 70–99)
Potassium: 4.6 mmol/L (ref 3.5–5.2)
Sodium: 139 mmol/L (ref 134–144)
Total Protein: 7.1 g/dL (ref 6.0–8.5)
eGFR: 55 mL/min/{1.73_m2} — ABNORMAL LOW (ref >59)

## 2023-05-29 LAB — HEMOGLOBIN A1C
Est. average glucose Bld gHb Est-mCnc: 120 mg/dL
Hgb A1c MFr Bld: 5.8 % — ABNORMAL HIGH (ref 4.8–5.6)

## 2023-05-29 LAB — VITAMIN D 25 HYDROXY (VIT D DEFICIENCY, FRACTURES): Vit D, 25-Hydroxy: 42.7 ng/mL (ref 30.0–100.0)

## 2023-06-02 ENCOUNTER — Encounter: Payer: Self-pay | Admitting: Nurse Practitioner

## 2023-06-06 NOTE — Telephone Encounter (Signed)
Forward to [employer]

## 2023-06-06 NOTE — Telephone Encounter (Signed)
Key: [id number] Drug: Reginal Lutes 0.25MG /0.5ML auto-injectors Form: Valinda Hoar [employer] Electronic Status: Waiting for Determination

## 2023-06-07 ENCOUNTER — Telehealth: Payer: Self-pay

## 2023-06-07 NOTE — Telephone Encounter (Signed)
Her plan does not cover any weight loss meds. Do you want to switch her to a cash pay option?

## 2023-06-18 NOTE — Telephone Encounter (Signed)
Your prior authorization request has been denied. Complete E-Appeal  Message from plan: Denied.  This health benefit plan does not cover the following services, supplies, drugs or charges: Any treatment or regimen, medical or surgical, for the purpose of reducing or controlling the weight of the member, or for the treatment of obesity, except for surgical treatment of morbid obesity, or as specifically covered by this health benefit plan

## 2023-09-03 ENCOUNTER — Encounter: Payer: Self-pay | Admitting: Nurse Practitioner

## 2023-09-03 ENCOUNTER — Ambulatory Visit: Payer: BC Managed Care – PPO | Admitting: Nurse Practitioner

## 2023-09-03 VITALS — BP 124/82 | HR 73 | Wt 238.6 lb

## 2023-09-03 DIAGNOSIS — I10 Essential (primary) hypertension: Secondary | ICD-10-CM

## 2023-09-03 DIAGNOSIS — Z23 Encounter for immunization: Secondary | ICD-10-CM | POA: Diagnosis not present

## 2023-09-03 DIAGNOSIS — R7303 Prediabetes: Secondary | ICD-10-CM | POA: Diagnosis not present

## 2023-09-03 DIAGNOSIS — R5382 Chronic fatigue, unspecified: Secondary | ICD-10-CM | POA: Insufficient documentation

## 2023-09-03 DIAGNOSIS — Z6841 Body Mass Index (BMI) 40.0 and over, adult: Secondary | ICD-10-CM | POA: Diagnosis not present

## 2023-09-03 DIAGNOSIS — E559 Vitamin D deficiency, unspecified: Secondary | ICD-10-CM | POA: Diagnosis not present

## 2023-09-03 DIAGNOSIS — H9312 Tinnitus, left ear: Secondary | ICD-10-CM

## 2023-09-03 DIAGNOSIS — R7989 Other specified abnormal findings of blood chemistry: Secondary | ICD-10-CM

## 2023-09-03 DIAGNOSIS — R748 Abnormal levels of other serum enzymes: Secondary | ICD-10-CM

## 2023-09-03 MED ORDER — AMLODIPINE BESYLATE 10 MG PO TABS: ORAL_TABLET | ORAL | 3 refills | Status: DC

## 2023-09-03 MED ORDER — TIRZEPATIDE 2.5 MG/0.5ML ~~LOC~~ SOAJ: 2.5000 mg | SUBCUTANEOUS | 0 refills | Status: DC

## 2023-09-03 MED ORDER — LOSARTAN POTASSIUM 100 MG PO TABS: 100.0000 mg | ORAL_TABLET | Freq: Every day | ORAL | 3 refills | Status: DC

## 2023-09-03 MED ORDER — PHENTERMINE HCL 37.5 MG PO TABS: 37.5000 mg | ORAL_TABLET | Freq: Every day | ORAL | 0 refills | Status: DC

## 2023-09-03 NOTE — Assessment & Plan Note (Signed)
Prediabetes with diet and exercise management. Limited success with Weight Watchers (5 lbs loss). Discussed phentermine as an alternative due to insurance issues with Zepbound in the past. Phentermine is a stimulant that suppresses appetite and boosts metabolism, typically not covered by insurance but inexpensive. It is not a long-term solution and should be monitored for side effects. We discussed trying approval for [facility] under the prediabetes, BMI, and other co-morbidities.  - Resubmit insurance claim for [facility] with additional coding for BMI and comorbid conditions such as hypertension and hyperlipidemia - Encourage continuation of Weight Watchers program - Encourage 15 minutes of daily exercise, such as chair yoga or walking pad - Prescribe phentermine, 1 tablet daily in the morning - Monitor for side effects and effectiveness - Advise against taking phentermine later in the day to prevent insomnia

## 2023-09-03 NOTE — Progress Notes (Signed)
Shawna Clamp, DNP, AGNP-c [facility]  [address] [place], [place] [address] [phone]  ESTABLISHED PATIENT- Chronic Health and/or Follow-Up Visit  Blood pressure 124/82, pulse 73, weight 238 lb 9.6 oz (108.2 kg), last menstrual period 07/13/2016.    Mia Davis is a 57 y.o. year old female presenting today for evaluation and management of chronic conditions.   History of Present Illness Charmie presents with a chief complaint of persistent weight gain despite attempts at lifestyle modification, including a low-calorie diet through Weight Watchers. The patient reports a slow and inconsistent weight loss, with a net loss of only five pounds over several months. The patient acknowledges a lack of regular physical activity, but was walking more in the warmer weather and has plans to increase her activity levels in the coming weeks with walking and youtube exercises while the weather is cooler.   The patient also reports a constant ringing in her left ear, a condition present since 2019. The ringing is described as constant and more noticeable during quiet moments or when the patient is not busy. The patient also occasionally experiences sharp pain in the right ear, but no constant ringing. She has been seen for this in the past and no structural defects were noted.   The patient's mood is generally stable, but she notes a seasonal dip in mood due to the anniversary of the loss of her son. This annual grief is described as tiring and emotionally draining, but the patient does not believe it contributes significantly to her overall fatigue.  The patient's insurance has previously denied coverage for weight loss medication. The patient expresses interest in trying an over-the-counter appetite suppressant or a prescription medication like phentermine to aid in weight loss. We have not tried sending Mounjaro to see if her insurance will cover this for her BMI and comorbid  conditions.    All ROS negative with exception of what is listed above.   PHYSICAL EXAM Physical Exam Vitals and nursing note reviewed.  Constitutional:      Appearance: Normal appearance.  HENT:     Head: Normocephalic.     Right Ear: Tympanic membrane normal.     Left Ear: Tympanic membrane normal.     Ears:     Comments: Tinnitus in the left ear- no abnormalities noted on examination.  Eyes:     Pupils: Pupils are equal, round, and reactive to light.  Neck:     Vascular: No carotid bruit.  Cardiovascular:     Rate and Rhythm: Normal rate and regular rhythm.     Pulses: Normal pulses.     Heart sounds: Normal heart sounds.  Pulmonary:     Effort: Pulmonary effort is normal.     Breath sounds: Normal breath sounds.  Abdominal:     General: Bowel sounds are normal. There is no distension.     Palpations: Abdomen is soft.     Tenderness: There is no abdominal tenderness. There is no right CVA tenderness, left CVA tenderness or guarding.  Musculoskeletal:        General: Normal range of motion.     Cervical back: Normal range of motion.  Skin:    General: Skin is warm.  Neurological:     General: No focal deficit present.     Mental Status: She is alert and oriented to person, place, and time.  Psychiatric:        Mood and Affect: Mood normal.      PLAN Problem List  Items Addressed This Visit     Essential hypertension - Primary    Chronic. BP well controlled at this time. Continue current medications for management. No alarm symptoms. Hoping for approval for mounjaro due to BMI, HTN, and pre-diabetes.       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 Previous ENT consultation suggested psychological origin, but constant ringing persists, especially at night. Chronic tinnitus is often due to nerve damage and can be persistent. - Consider referral to ENT for further evaluation if symptoms persist or worsen       Elevated serum creatinine    Repeat labs      Relevant Medications   tirzepatide (MOUNJARO) 2.5 MG/0.5ML Pen   phentermine (ADIPEX-P) 37.5 MG tablet   Other Relevant Orders   Comprehensive metabolic panel   Low serum high density lipoprotein (HDL)    Repeat labs today      Relevant Medications   tirzepatide (MOUNJARO) 2.5 MG/0.5ML Pen   phentermine (ADIPEX-P) 37.5 MG tablet   Pre-diabetes    Prediabetes with diet and exercise management. Limited success with Weight Watchers (5 lbs loss). Discussed phentermine as an alternative due to insurance issues with Zepbound in the past. Phentermine is a stimulant that suppresses appetite and boosts metabolism, typically not covered by insurance but inexpensive. It is not a long-term solution and should be monitored for side effects. We discussed trying approval for [facility] under the prediabetes, BMI, and other co-morbidities.  - Resubmit insurance claim for [facility] with additional coding for BMI and comorbid conditions such as hypertension and hyperlipidemia - Encourage continuation of Weight Watchers program - Encourage 15 minutes of daily exercise, such as chair yoga or walking pad - Prescribe phentermine, 1 tablet daily in the morning - Monitor for side effects and effectiveness - Advise against taking phentermine later in the day to prevent insomnia       Relevant Medications   tirzepatide (MOUNJARO) 2.5 MG/0.5ML Pen   phentermine (ADIPEX-P) 37.5 MG tablet   Other Relevant Orders   Hemoglobin A1c   Vitamin D deficiency    Recheck labs      Relevant Orders   VITAMIN D 25 Hydroxy (Vit-D Deficiency, Fractures)   BMI 40.0-44.9, adult (HCC)    BMI over the  chart with limited weight loss despite Weight Watchers. Discussed phentermine as an appetite suppressant and metabolism booster. Phentermine is a stimulant that suppresses appetite and boosts metabolism, typically not covered by insurance but inexpensive. It is not a long-term solution and should be monitored for side effects. - Prescribe phentermine, 1 tablet daily in the morning - Monitor for side effects and effectiveness - Advise against taking phentermine later in the day to prevent insomnia      Relevant Medications   tirzepatide (MOUNJARO) 2.5 MG/0.5ML Pen   phentermine (ADIPEX-P) 37.5 MG tablet   Other Relevant Orders   Hemoglobin A1c   CBC with Differential/Platelet   Comprehensive metabolic panel   Chronic fatigue    Labs for monitoring.       Relevant Orders   TSH   Other Visit Diagnoses     Need for COVID-19 vaccine  Relevant Orders   Pfizer Comirnaty Covid -19 Vaccine 77yrs and older (Completed)   Need for influenza vaccination       Relevant Orders   Flu vaccine trivalent PF, 6mos and older(Flulaval,Afluria,Fluarix,Fluzone) (Completed)       Return in about 6 months (around 03/02/2024) for CPE.  Shawna Clamp, DNP, AGNP-c

## 2023-09-03 NOTE — Assessment & Plan Note (Signed)
BMI over the chart with limited weight loss despite Weight Watchers. Discussed phentermine as an appetite suppressant and metabolism booster. Phentermine is a stimulant that suppresses appetite and boosts metabolism, typically not covered by insurance but inexpensive. It is not a long-term solution and should be monitored for side effects. - Prescribe phentermine, 1 tablet daily in the morning - Monitor for side effects and effectiveness - Advise against taking phentermine later in the day to prevent insomnia

## 2023-09-03 NOTE — Patient Instructions (Addendum)
Goals:  I want you to start walking or using a Youtube exercise video at least once a day. This will DEFINITELY help with your weight loss efforts. Your body needs to use off the excess calories thorough burning them and the only way to do this is to burn more calories. :-)   I have sent in the phentermine for you. If this is working, let me know and we can send the next 3 months once you get close to running out of this prescription. If we get approval for the [facility], we can stop the phentermine once you get on the higher doses of this.    Keep up the great work with [employer]!!! I am proud of you!  WEIGHT LOSS PLANNING Your progress today shows:     09/03/2023    8:23 AM 05/28/2023    9:11 AM 12/25/2022    4:00 PM  Vitals with BMI  Height   5' 5.5"  Weight 238 lbs 10 oz 244 lbs 6 oz 235 lbs  BMI   38.5  Systolic 124 124 161  Diastolic 82 78 82  Pulse 73 82 71    For best management of weight, it is vital to balance intake versus output. This means the number of calories burned per day must be less than the calories you take in with food and drink.   I recommend trying to follow a diet with the following: Calories: 1200-1500 calories per day Carbohydrates: 150-180 grams of carbohydrates per day  Why: Gives your body enough "quick fuel" for cells to maintain normal function without sending them into starvation mode.  Protein: At least 90 grams of protein per day- 30 grams with each meal Why: Protein takes longer and uses more energy than carbohydrates to break down for fuel. The carbohydrates in your meals serves as quick energy sources and proteins help use some of that extra quick energy to break down to produce long term energy. This helps you not feel hungry as quickly and protein breakdown burns calories.  Water: Drink AT LEAST 64 ounces of water per day  Why: Water is essential to healthy metabolism. Water helps to fill the stomach and keep you fuller longer. Water is  required for healthy digestion and filtering of waste in the body.  Fat: Limit fats in your diet- when choosing fats, choose foods with lower fats content such as lean meats (chicken, fish, [place]).  Why: Increased fat intake leads to storage "for later". Once you burn your carbohydrate energy, your body goes into fat and protein breakdown mode to help you loose weight.  Cholesterol: Fats and oils that are LIQUID at room temperature are best. Choose vegetable oils (olive oil, avocado oil, nuts). Avoid fats that are SOLID at room temperature (animal fats, processed meats). Healthy fats are often found in whole grains, beans, nuts, seeds, and berries.  Why: Elevated cholesterol levels lead to build up of cholesterol on the inside of your blood vessels. This will eventually cause the blood vessels to become hard and can lead to high blood pressure and damage to your organs. When the blood flow is reduced, but the pressure is high from cholesterol buildup, parts of the cholesterol can break off and form clots that can go to the brain or heart leading to a stroke or heart attack.  Fiber: Increase amount of SOLUBLE the fiber in your diet. This helps to fill you up, lowers cholesterol, and helps with digestion. Some foods high in soluble  fiber are oats, peas, beans, apples, carrots, barley, and citrus fruits.   Why: Fiber fills you up, helps remove excess cholesterol, and aids in healthy digestion which are all very important in weight management.   I recommend the following as a minimum activity routine: Purposeful walk or other physical activity at least 20 minutes every single day. This means purposefully taking a walk, jog, bike, swim, treadmill, elliptical, dance, etc.  This activity should be ABOVE your normal daily activities, such as walking at work. Goal exercise should be at least 150 minutes a week- work your way up to this.   Heart Rate: Your maximum exercise heart rate should be 220 - Your Age  in Years. When exercising, get your heart rate up, but avoid going over the maximum targeted heart rate.  60-70% of your maximum heart rate is where you tend to burn the most fat. To find this number:  220 - Age In Years= Max HR  Max HR x 0.6 (or 0.7) = Fat Burning HR The Fat Burning HR is your goal heart rate while working out to burn the most fat.  NEVER exercise to the point your feel lightheaded, weak, nauseated, dizzy. If you experience ANY of these symptoms- STOP exercise! Allow yourself to cool down and your heart rate to come down. Then restart slower next time.  If at ANY TIME you feel chest pain or chest pressure during exercise, STOP IMMEDIATELY and seek medical attention.

## 2023-09-03 NOTE — Assessment & Plan Note (Signed)
Recheck labs 

## 2023-09-03 NOTE — Assessment & Plan Note (Signed)
Chronic. BP well controlled at this time. Continue current medications for management. No alarm symptoms. Hoping for approval for mounjaro due to BMI, HTN, and pre-diabetes.

## 2023-09-03 NOTE — Assessment & Plan Note (Signed)
Labs for monitoring.

## 2023-09-03 NOTE — Assessment & Plan Note (Signed)
Repeat labs:

## 2023-09-03 NOTE — Assessment & Plan Note (Signed)
Repeat labs today

## 2023-09-03 NOTE — Assessment & Plan Note (Signed)
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 Previous ENT consultation suggested psychological origin, but constant ringing persists, especially at night. Chronic tinnitus is often due to nerve damage and can be persistent. - Consider referral to ENT for further evaluation if symptoms persist or worsen

## 2023-09-04 LAB — COMPREHENSIVE METABOLIC PANEL
ALT: 14 [IU]/L (ref 0–32)
AST: 17 [IU]/L (ref 0–40)
Albumin: 4.3 g/dL (ref 3.8–4.9)
Alkaline Phosphatase: 97 [IU]/L (ref 44–121)
BUN/Creatinine Ratio: 9 (ref 9–23)
BUN: 11 mg/dL (ref 6–24)
Bilirubin Total: 1.1 mg/dL (ref 0.0–1.2)
CO2: 22 mmol/L (ref 20–29)
Calcium: 9.4 mg/dL (ref 8.7–10.2)
Chloride: 105 mmol/L (ref 96–106)
Creatinine, Ser: 1.19 mg/dL — ABNORMAL HIGH (ref 0.57–1.00)
Globulin, Total: 2.5 g/dL (ref 1.5–4.5)
Glucose: 121 mg/dL — ABNORMAL HIGH (ref 70–99)
Potassium: 4.2 mmol/L (ref 3.5–5.2)
Sodium: 141 mmol/L (ref 134–144)
Total Protein: 6.8 g/dL (ref 6.0–8.5)
eGFR: 53 mL/min/{1.73_m2} — ABNORMAL LOW (ref >59)

## 2023-09-04 LAB — CBC WITH DIFFERENTIAL/PLATELET
Basophils Absolute: 0 10*3/uL (ref 0.0–0.2)
Basos: 0 %
EOS (ABSOLUTE): 0.1 10*3/uL (ref 0.0–0.4)
Eos: 2 %
Hematocrit: 45.7 % (ref 34.0–46.6)
Hemoglobin: 14.7 g/dL (ref 11.1–15.9)
Immature Grans (Abs): 0 10*3/uL (ref 0.0–0.1)
Immature Granulocytes: 0 %
Lymphocytes Absolute: 2.1 10*3/uL (ref 0.7–3.1)
Lymphs: 35 %
MCH: 26.2 pg — ABNORMAL LOW (ref 26.6–33.0)
MCHC: 32.2 g/dL (ref 31.5–35.7)
MCV: 81 fL (ref 79–97)
Monocytes Absolute: 0.4 10*3/uL (ref 0.1–0.9)
Monocytes: 7 %
Neutrophils Absolute: 3.2 10*3/uL (ref 1.4–7.0)
Neutrophils: 56 %
Platelets: 280 10*3/uL (ref 150–450)
RBC: 5.62 x10E6/uL — ABNORMAL HIGH (ref 3.77–5.28)
RDW: 14.1 % (ref 11.7–15.4)
WBC: 5.9 10*3/uL (ref 3.4–10.8)

## 2023-09-04 LAB — HEMOGLOBIN A1C
Est. average glucose Bld gHb Est-mCnc: 117 mg/dL
Hgb A1c MFr Bld: 5.7 % — ABNORMAL HIGH (ref 4.8–5.6)

## 2023-09-04 LAB — TSH: TSH: 1.49 u[IU]/mL (ref 0.450–4.500)

## 2023-09-04 LAB — VITAMIN D 25 HYDROXY (VIT D DEFICIENCY, FRACTURES): Vit D, 25-Hydroxy: 59.3 ng/mL (ref 30.0–100.0)

## 2023-09-07 ENCOUNTER — Telehealth: Payer: Self-pay | Admitting: Nurse Practitioner

## 2023-09-07 NOTE — Telephone Encounter (Signed)
P.A. MOUNJARO 

## 2023-09-16 NOTE — Telephone Encounter (Signed)
PA denied, no reason given.

## 2023-10-14 NOTE — Telephone Encounter (Signed)
 Pt is on Phentermine

## 2023-10-29 DIAGNOSIS — Z1231 Encounter for screening mammogram for malignant neoplasm of breast: Secondary | ICD-10-CM | POA: Diagnosis not present

## 2023-10-29 LAB — HM MAMMOGRAPHY

## 2023-12-03 ENCOUNTER — Ambulatory Visit: Payer: BC Managed Care – PPO | Admitting: Nurse Practitioner

## 2023-12-03 ENCOUNTER — Encounter: Payer: BC Managed Care – PPO | Admitting: Nurse Practitioner

## 2023-12-03 ENCOUNTER — Encounter: Payer: Self-pay | Admitting: Nurse Practitioner

## 2023-12-03 VITALS — BP 122/76 | HR 76 | Wt 240.4 lb

## 2023-12-03 DIAGNOSIS — K259 Gastric ulcer, unspecified as acute or chronic, without hemorrhage or perforation: Secondary | ICD-10-CM | POA: Insufficient documentation

## 2023-12-03 MED ORDER — PANTOPRAZOLE SODIUM 40 MG PO TBEC: DELAYED_RELEASE_TABLET | ORAL | 1 refills | Status: DC

## 2023-12-03 NOTE — Progress Notes (Signed)
 Tollie Eth, DNP, AGNP-c [facility] [address] [place], [place] [address] [phone]   ACUTE VISIT- ESTABLISHED PATIENT  Blood pressure 122/76, pulse 76, weight 240 lb 6.4 oz (109 kg), last menstrual period 07/13/2016.  Subjective:  HPI Mia Davis is a 58 y.o. female presents to day for evaluation of acute concern(s).   History of Present Illness Mia Davis is a 58 year old female who presents with chest pain and a burning sensation.  She experiences a burning sensation and pain in her chest that began last Monday around 4 AM. The pain is persistent, located in the chest area, and is particularly bothersome at night, causing frequent awakenings. It is not triggered by eating or lying down. She has tried famotidine without relief but found temporary relief with vinegar and water. Lemon ginger tea and HCV led to belching and passing gas, but the pain returned.  She experiences shooting pains in her upper abdomen and occasional stomach area pain. No pain upon pressure in the abdomen, except for tenderness in the distal sternum area. She does admit to pressing on this area. No vomiting, but she reports regurgitation sensations where it feels like something wants to come up but then goes back down.  She has had difficulty sleeping, with only two good nights of sleep since the onset of symptoms. An allergy pill for phlegm in her throat helped her sleep better on one occasion. She stopped taking phentermine in December and currently only takes blood pressure medication.  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 She is concerned about her financial obligations and the impact on her daily life, including her ability to support her family and grandchildren.  ROS negative except for what is listed in HPI. History, Medications, Surgery, SDOH, and  Family History reviewed and updated as appropriate.  Objective:  Physical Exam Vitals and nursing note reviewed.  Constitutional:      General: She is not in acute distress.    Appearance: Normal appearance. She is obese. She is not ill-appearing.  HENT:     Head: Normocephalic.  Eyes:     Conjunctiva/sclera: Conjunctivae normal.  Neck:     Vascular: No carotid bruit.  Cardiovascular:     Rate and Rhythm: Normal rate and regular rhythm.     Pulses: Normal pulses.     Heart sounds: Normal heart sounds.  Pulmonary:     Effort: Pulmonary effort is normal.     Breath sounds: Normal breath sounds.  Abdominal:     General: Bowel sounds are normal. There is no distension or abdominal bruit.     Palpations: Abdomen is soft. There is no shifting dullness, fluid wave, hepatomegaly, splenomegaly or mass.     Tenderness: There is abdominal tenderness in the epigastric area. There is no right CVA tenderness, left CVA tenderness, guarding or rebound.     Hernia: No hernia is present.    Skin:    General: Skin is warm and dry.     Capillary Refill: Capillary refill takes less than 2 seconds.  Neurological:     General: No focal deficit present.     Mental Status: She is alert and oriented to person, place, and time.  Psychiatric:        Mood and Affect: Mood normal.        Behavior: Behavior normal.  Assessment & Plan:   Problem List Items Addressed This Visit     Gastric stress ulcer - Primary   Stress ulcer suspected. Intermittent nocturnal burning chest pain since last Monday, with regurgitation and belching. Pain localized to the sternum and upper abdomen. No significant tenderness on palpation, ruling out pancreatic, liver, and gallbladder issues. Possible contributing factors include stress and dietary habits. Differential diagnosis includes eosinophilic esophagitis and hypersecretory condition. Famotidine was ineffective, but vinegar provided temporary relief. Discussed  that stress can increase stomach acid production, leading to irritation and GERD symptoms. Explained that PPIs can significantly reduce acid production, allowing the stomach and esophagus to heal. Most patients notice improvement within a few days, with a typical treatment duration of one month. - Prescribe PPI twice daily for 2 weeks, then once daily for 30 days - Advise to monitor symptoms and report if no improvement within a few days - Discuss stress management techniques      Relevant Medications   pantoprazole (PROTONIX) 40 MG tablet      Tollie Eth, DNP, AGNP-c

## 2023-12-03 NOTE — Assessment & Plan Note (Signed)
 Stress ulcer suspected. Intermittent nocturnal burning chest pain since last Monday, with regurgitation and belching. Pain localized to the sternum and upper abdomen. No significant tenderness on palpation, ruling out pancreatic, liver, and gallbladder issues. Possible contributing factors include stress and dietary habits. Differential diagnosis includes eosinophilic esophagitis and hypersecretory condition. Famotidine was ineffective, but vinegar provided temporary relief. Discussed that stress can increase stomach acid production, leading to irritation and GERD symptoms. Explained that PPIs can significantly reduce acid production, allowing the stomach and esophagus to heal. Most patients notice improvement within a few days, with a typical treatment duration of one month. - Prescribe PPI twice daily for 2 weeks, then once daily for 30 days - Advise to monitor symptoms and report if no improvement within a few days - Discuss stress management techniques

## 2023-12-03 NOTE — Patient Instructions (Signed)
 I have sent in a medication called pantoprazole for you to start twice a day for two weeks then cut down to once a day. I recommend taking this for at least 30 days after the first two weeks. You can continue longer if your symptoms continue.    Peptic Ulcer  A peptic ulcer is a sore in the lining of the stomach (gastric ulcer) or the first part of the small intestine (duodenal ulcer). The ulcer causes a gradual wearing away (erosion) of the deeper tissue. What are the causes? Normally, the lining of the stomach and the small intestine protects them from the acid that digests food. The protective lining can be damaged by: An infection caused by a type of bacteria called Helicobacter pylori or H. pylori. Regular use of NSAIDs, such as ibuprofen or aspirin. Rare tumors in the stomach, small intestine, or pancreas (Zollinger-Ellison syndrome). What increases the risk? The following factors may make you more likely to develop this condition: Smoking. Having a family history of ulcer disease. Drinking alcohol. Having been hospitalized in an intensive care unit (ICU). What are the signs or symptoms? Symptoms of this condition include: Persistent burning pain in the area between the chest and the belly button. The pain may be worse on an empty stomach and at night. Heartburn. Nausea and vomiting. Bloating. If the ulcer results in bleeding, it can cause: Black, tarry stools. Vomiting of bright red blood. Vomiting of material that looks like coffee grounds. How is this diagnosed? This condition may be diagnosed based on: Your medical history and a physical exam. Various tests or procedures, such as: Upper endoscopy. The health care provider examines the esophagus, stomach, and small intestine using a small flexible tube that has a video camera at the end. Blood tests, stool tests, or breath tests to check for the H. pylori bacteria. An X-ray exam (upper gastrointestinal series) of the  esophagus, stomach, and small intestine. A biopsy to help find certain causes of ulcers. A tissue sample is removed during upper endoscopy to be examined under a microscope. How is this treated? Treatment for this condition may include: Eliminating the cause of the ulcer, such as smoking or use of NSAIDs, and limiting alcohol and caffeine intake. Medicines to reduce the amount of acid in your digestive tract. Antibiotic medicines, if the ulcer is caused by an H. pylori infection. An upper endoscopy may be used to treat a bleeding ulcer. Surgery. This may be needed if the bleeding is severe or if the ulcer created a hole somewhere in the digestive system. Follow these instructions at home: Do not drink alcohol if your health care provider tells you not to drink. Do not use any products that contain nicotine or tobacco. These products include cigarettes, chewing tobacco, and vaping devices, such as e-cigarettes. If you need help quitting, ask your health care provider. Take over-the-counter and prescription medicines only as told by your health care provider. Do not use over-the-counter medicines in place of prescription medicines unless your health care provider approves. Do not take aspirin, ibuprofen, or other NSAIDs unless your health care provider tells you to. Keep all follow-up visits. This is important. Contact a health care provider if: Your symptoms do not improve within 7 days of starting treatment. You have ongoing indigestion or heartburn. Get help right away if: You have sudden, sharp, or persistent pain in your abdomen. You have bloody or dark black, tarry stools. You vomit blood or material that looks like coffee grounds. You become  light-headed or you feel faint. You become weak. You become sweaty or clammy. These symptoms may be an emergency. Get help right away. Call 911. Do not wait to see if the symptoms will go away. Do not drive yourself to the hospital. Summary A  peptic ulcer is a sore in the lining of the stomach (gastric ulcer) or the first part of the small intestine (duodenal ulcer). The ulcer causes a gradual wearing away (erosion) of the deeper tissue. Do not use any products that contain nicotine or tobacco. These products include cigarettes, chewing tobacco, and vaping devices, such as e-cigarettes. If you need help quitting, ask your health care provider. Take over-the-counter and prescription medicines only as told by your health care provider. Do not use over-the-counter medicines in place of prescription medicines unless your health care provider approves. Limit your alcohol and caffeine intake. Keep all follow-up visits. This is important. This information is not intended to replace advice given to you by your health care provider. Make sure you discuss any questions you have with your health care provider. Document Revised: 05/05/2021 Document Reviewed: 05/06/2021 Elsevier Patient Education  2024 [employer].

## 2023-12-11 ENCOUNTER — Encounter: Payer: Self-pay | Admitting: Nurse Practitioner

## 2023-12-12 ENCOUNTER — Encounter: Payer: Self-pay | Admitting: Family Medicine

## 2023-12-12 ENCOUNTER — Ambulatory Visit: Admitting: Family Medicine

## 2023-12-12 ENCOUNTER — Ambulatory Visit: Payer: Self-pay | Admitting: Nurse Practitioner

## 2023-12-12 VITALS — BP 128/78 | HR 64 | Temp 100.0°F | Ht 65.0 in | Wt 242.2 lb

## 2023-12-12 DIAGNOSIS — R0989 Other specified symptoms and signs involving the circulatory and respiratory systems: Secondary | ICD-10-CM

## 2023-12-12 DIAGNOSIS — R058 Other specified cough: Secondary | ICD-10-CM | POA: Diagnosis not present

## 2023-12-12 DIAGNOSIS — R509 Fever, unspecified: Secondary | ICD-10-CM

## 2023-12-12 DIAGNOSIS — J069 Acute upper respiratory infection, unspecified: Secondary | ICD-10-CM | POA: Diagnosis not present

## 2023-12-12 LAB — POC COVID19 BINAXNOW: SARS Coronavirus 2 Ag: NEGATIVE

## 2023-12-12 LAB — POCT INFLUENZA A/B
Influenza A, POC: NEGATIVE
Influenza B, POC: NEGATIVE

## 2023-12-12 MED ORDER — BENZONATATE 200 MG PO CAPS: 200.0000 mg | ORAL_CAPSULE | Freq: Three times a day (TID) | ORAL | 0 refills | Status: DC | PRN

## 2023-12-12 NOTE — Patient Instructions (Addendum)
 Flonase is to be used regularly, for allergies. You can stop using it now, since this is likely viral, and not related to allergies. It can take up to a week or more to see the full effect from using Flonase, so it is best used every day, during allergy season/flares.  Using a saline nasal spray, and/or sinus rinses, will help your symptoms. I recommend using either [employer] Sinus Rinse Kit or a Neti-pot once or twice daily.  This should help with your sinus pain, and potentially prevent an infection.  Continue the Delsym syrup twice daily. Also start taking Mucinex 12 hour tablets (plain, not the D or DM), twice daily. If your cough isn't improving with these medications, continue the Mucinex, but start taking the prescription benzonatate as needed.   Contact us if you have persistently high fevers, if you develop discolored mucus or phlegm, or worsening sinus pain, despite trying the above measures, ongoing or worsening shortness of breath, or other new symptoms.  Restart the pantoprazole--it didn't cause these symptoms, and you might feel worse if your chest discomfort returns.  Continue tylenol as needed for fever or pain.

## 2023-12-12 NOTE — Progress Notes (Signed)
 Chief Complaint  Patient presents with   Cough    Cough and congestion started Friday. Has not done any home testing. Has has some chills, has not checked her temp.    She was seen by her PCP SaraBeth on 2/24 with chest pain and burning, worse at night. Famotidine didn't help, but found temporary relief with vinegar and water. She was felt to have GERD and possible stress ulcer. She was started on pantoprazole 40 mg, advised to take it twice daily for 2 weeks, then once daily for 30 days.  Chest symptoms have improved.  Admits that she didn't take her medication since yesterday morning, as she was concerned that perhaps the medication was contributing to her current symptoms.  She sent the following message yesterday: "Good morning, I know you asked me to let you all know if there were any problems after a few days last week while taking the medicine for Acid Reflux. After the first few days I was fine. Little pain every now and again. As of Friday, 12/07/23, I have had this lingering cough. Especially at night when i am trying to sleep. I cough so much until I am choking, my chest hurt when I cough, my throat hurt and sore a little. At first, it was just I felt like phlegm was at the top of my throat and I was constantly trying to swallow it back down. That became an all day problem not a night problem. I bought cough drops to keep my mouth and throat lubricated. I am taking them like candy and it doesn't help. I think after today, I am going to stop taking the acid reflux medicine to see if that helps with these problems."   Congestion and cough started Friday 2/28.  Nose is mostly stuffy.  Nasal drainage is clear.  She has been using Flonase (not regularly, started with congestion).. Cough was initially at night, now is all the time.  Cough is sometimes dry, sometimes coughs up clear phlegm.  Phlegm is thick, clear. She is having frontal headaches (above her eyes). +sore throat. No ear pain. Never  had body aches. Having some shortness of breath with activity, such as stairs. No h/o asthma.  Energy has been okay, other than with activity, where she needs to stop and rest.  No sick contacts or travel.  Delsym syrup hasn't been helping much today, but did help her sleep last night.  PMH, PSH, SH reviewed Works [employer]   Outpatient Encounter Medications as of 12/12/2023  Medication Sig Note   amLODipine (NORVASC) 10 MG tablet TAKE 1 TABLET(10 MG) BY MOUTH DAILY    Cholecalciferol 1.25 MG (50000 UT) capsule Take 1 capsule (50,000 Units total) by mouth once a week. Please repeat labs on completion of 6 months.    Dextromethorphan HBr (DELSYM PO) Take 20 mLs by mouth as needed. 12/12/2023: Last dose 11am   levocetirizine (XYZAL) 5 MG tablet Take 5 mg by mouth every evening.    losartan (COZAAR) 100 MG tablet Take 1 tablet (100 mg total) by mouth daily.    pantoprazole (PROTONIX) 40 MG tablet Take 1 tablet every morning and 1 tablet every evening for 2 weeks then take 1 tablet every morning. 12/12/2023: Last dose was yesterday am   clobetasol ointment (TEMOVATE) 0.05 % Apply 1 Application topically 2 (two) times a week. Thin application at the involve vulva and perianal area. (Patient not taking: Reported on 12/12/2023) 12/12/2023: As needed   [DISCONTINUED] famotidine (PEPCID) 20  MG tablet Take 20 mg by mouth 2 (two) times daily. 12/03/2023: As needed at night time to see if that would help   No facility-administered encounter medications on file as of 12/12/2023.   No Known Allergies  ROS: +chills, no known fevers. No n/v/d. No loss of taste or smell. No chest pain, palpitations. Heartburn/reflux/chest pain has improved. No edema. No rashes, bleeding, bruising.    PHYSICAL EXAM:  BP 128/78   Pulse 64   Temp 100 F (37.8 C) (Tympanic)   Ht 5\' 5"  (1.651 m)   Wt 242 lb 3.2 oz (109.9 kg)   LMP 07/13/2016   BMI 40.30 kg/m   Wt Readings from Last 3 Encounters:  12/12/23 242 lb 3.2 oz  (109.9 kg)  12/03/23 240 lb 6.4 oz (109 kg)  09/03/23 238 lb 9.6 oz (108.2 kg)   Sniffling frequently, occasional episodes of cough, improved with drinking some water. She is in no distress HEENT: conjunctiva and sclera are clear, EOMI. TM's and EAC's normal. Nasal mucosa is moderately edematous, erythematous, no mucus noted. Sinuses are nontender. OP is clear Neck: no lymphadenopathy or mass Heart: regular rate and rhythm Lungs: clear bilaterally. No wheezes, rales, ronchi Psych: normal mood, affect, hygiene and grooming Neuro: alert and oriented, cranial nerves grossly intact, normal gait.  Influenza A&B and COVID tests were negative   ASSESSMENT/PLAN:  Viral upper respiratory infection - negative for flu and COVID.  No e/o bacterial infection on exam. Supportive measures reviewed, s/sx for which to contact us reviewed  Other cough - Plan: POC COVID-19, Influenza A/B, benzonatate (TESSALON) 200 MG capsule  Chest congestion - Plan: POC COVID-19, Influenza A/B  Fever, unspecified fever cause - Plan: POC COVID-19, Influenza A/B  OOW tomorrow, likely can work from home on Friday. Will contact us if note for Friday is needed.  To add Mucinex 12 hour, continue Delsym BID. To restart protonix. Tessalon rx'd to help with cough if the above measures aren't adequately treating her cough. Tylenol prn fever or pain. Lungs are clear--suspect chest congestion is contributing to fatigue/DOE. To contact us if not improving with use of mucinex.  I spent 40 minutes dedicated to the care of this patient, including pre-visit review of records, face to face time, post-visit ordering of testing and documentation.

## 2023-12-12 NOTE — Telephone Encounter (Signed)
 Copied from CRM [id number]. Topic: Clinical - Red Word Triage >> Dec 12, 2023  8:15 AM Geroge Baseman wrote: Red Word that prompted transfer to Nurse Triage: Patient thought she had acid reflux, been taking meds and its not getting any better. She developed a cough and dry throat and is feeling awful, headache. Believes its stemming from constant cough   Chief Complaint: dry cough Symptoms: nasal congestion, drainage, slight headache Frequency: ongoing since Friday Pertinent Negatives: Patient denies fever Disposition: [] ED /[] Urgent Care (no appt availability in office) / [x] Appointment(In office/virtual)/ []  [facility]/ [] Home Care/ [] Refused Recommended Disposition /[] [facility]/ []  Follow-up with PCP Additional Notes:  The patient reported that she started Protonix Monday and Friday she started having a non productive cough, drainage in the back of her throat, a slight headache and nasal congestion.  She is unsure if the symptoms are related to the new medication.  She normally has intermittent drainage for which she takes Xyzal at night but now the drainage is constant and the Xyzal has been unhelpful.  She has taken Delsym for the cough which has also been unhelpful.  She was scheduled for a same day appointment for further evaluation.  Reason for Disposition  [1] Continuous (nonstop) coughing interferes with work or school AND [2] no improvement using cough treatment per Care Advice  Answer Assessment - Initial Assessment Questions 1. ONSET: "When did the cough begin?"      Friday 2. SEVERITY: "How bad is the cough today?"      Constant  3. SPUTUM: "Describe the color of your sputum" (none, dry cough; clear, white, yellow, green)     none 4. HEMOPTYSIS: "Are you coughing up any blood?" If so ask: "How much?" (flecks, streaks, tablespoons, etc.)     None  5. DIFFICULTY BREATHING: "Are you having difficulty breathing?" If Yes, ask: "How bad is it?" (e.g., mild,  moderate, severe)    - MILD: No SOB at rest, mild SOB with walking, speaks normally in sentences, can lie down, no retractions, pulse < 100.    - MODERATE: SOB at rest, SOB with minimal exertion and prefers to sit, cannot lie down flat, speaks in phrases, mild retractions, audible wheezing, pulse 100-120.    - SEVERE: Very SOB at rest, speaks in single words, struggling to breathe, sitting hunched forward, retractions, pulse > 120      Shortness of breath with exertion 6. FEVER: "Do you have a fever?" If Yes, ask: "What is your temperature, how was it measured, and when did it start?"     None   10. OTHER SYMPTOMS: "Do you have any other symptoms?" (e.g., runny nose, wheezing, chest pain)       Drainage in back of throat, slight headache, nasal congestion  Chest hurting only with cough  Protocols used: Cough - Acute Non-Productive-A-AH

## 2024-02-15 ENCOUNTER — Ambulatory Visit: Payer: BC Managed Care – PPO | Admitting: Nurse Practitioner

## 2024-02-15 ENCOUNTER — Encounter: Payer: Self-pay | Admitting: Nurse Practitioner

## 2024-02-15 VITALS — BP 124/80 | HR 63 | Ht 67.25 in | Wt 240.2 lb

## 2024-02-15 DIAGNOSIS — Z6841 Body Mass Index (BMI) 40.0 and over, adult: Secondary | ICD-10-CM | POA: Diagnosis not present

## 2024-02-15 DIAGNOSIS — E559 Vitamin D deficiency, unspecified: Secondary | ICD-10-CM | POA: Diagnosis not present

## 2024-02-15 DIAGNOSIS — R7303 Prediabetes: Secondary | ICD-10-CM | POA: Diagnosis not present

## 2024-02-15 DIAGNOSIS — Z0000 Encounter for general adult medical examination without abnormal findings: Secondary | ICD-10-CM

## 2024-02-15 DIAGNOSIS — I10 Essential (primary) hypertension: Secondary | ICD-10-CM | POA: Diagnosis not present

## 2024-02-15 DIAGNOSIS — G8929 Other chronic pain: Secondary | ICD-10-CM

## 2024-02-15 DIAGNOSIS — M25571 Pain in right ankle and joints of right foot: Secondary | ICD-10-CM | POA: Insufficient documentation

## 2024-02-15 DIAGNOSIS — M25561 Pain in right knee: Secondary | ICD-10-CM

## 2024-02-15 DIAGNOSIS — M25562 Pain in left knee: Secondary | ICD-10-CM

## 2024-02-15 DIAGNOSIS — M25551 Pain in right hip: Secondary | ICD-10-CM | POA: Insufficient documentation

## 2024-02-15 HISTORY — DX: Pain in right ankle and joints of right foot: M25.571

## 2024-02-15 LAB — LIPID PANEL

## 2024-02-15 MED ORDER — MELOXICAM 15 MG PO TABS: 15.0000 mg | ORAL_TABLET | Freq: Every day | ORAL | 2 refills | Status: DC

## 2024-02-15 NOTE — Patient Instructions (Addendum)
 Keep an eye on your blood pressure at home and if it is dropping less than 100/60 then let me know and we can adjust the medications.    If labs were drawn at your appointment today, you will see these results before your provider. They will be reviewed by the provider once all labs have resulted. If there are any concerns, you will receive a MyChart message or a phone call with recommendations. If the labs are normal you may not receive recommendations.  I will be out of the office for vacation and will be responding to only urgent or alarm labs until I return.   For all adult patients, I recommend A well balanced diet low in saturated fats, cholesterol, and moderation in carbohydrates.   This can be as simple as monitoring portion sizes and cutting back on sugary beverages such as soda and juice to start with.    Daily water consumption of at least 64 ounces.  Physical activity at least 180 minutes per week, if just starting out.   This can be as simple as taking the stairs instead of the elevator and walking 2-3 laps around the office  purposefully every day.   STD protection, partner selection, and regular testing if high risk.  Limited consumption of alcoholic beverages if alcohol is consumed.  For women, I recommend no more than 7 alcoholic beverages per week, spread out throughout the week.  Avoid "binge" drinking or consuming large quantities of alcohol in one setting.   Please let me know if you feel you may need help with reduction or quitting alcohol consumption.   Avoidance of nicotine, if used.  Please let me know if you feel you may need help with reduction or quitting nicotine use.   Daily mental health attention.  This can be in the form of 5 minute daily meditation, prayer, journaling, yoga, reflection, etc.   Purposeful attention to your emotions and mental state can significantly improve your overall wellbeing  and  Health.  Please know that I am here to help you with  all of your health care goals and am happy to work with you to find a solution that works best for you.  The greatest advice I have received with any changes in life are to take it one step at a time, that even means if all you can focus on is the next 60 seconds, then do that and celebrate your victories.  With any changes in life, you will have set backs, and that is OK. The important thing to remember is, if you have a set back, it is not a failure, it is an opportunity to try again!  Health Maintenance Recommendations Screening Testing Mammogram Every 1 -2 years based on history and risk factors Starting at age 74 Pap Smear Ages 21-39 every 3 years Ages 28-65 every 5 years with HPV testing More frequent testing may be required based on results and history Colon Cancer Screening Every 1-10 years based on test performed, risk factors, and history Starting at age 88 Bone Density Screening Every 2-10 years based on history Starting at age 71 for women Recommendations for men differ based on medication usage, history, and risk factors AAA Screening One time ultrasound Men 52-41 years old who have every smoked Lung Cancer Screening Low Dose Lung CT every 12 months Age 21-80 years with a 30 pack-year smoking history who still smoke or who have quit within the last 15 years  Screening Labs Routine  Labs: Complete Blood Count (CBC), Complete Metabolic Panel (CMP), Cholesterol (Lipid Panel) Every 6-12 months based on history and medications May be recommended more frequently based on current conditions or previous results Hemoglobin A1c Lab Every 3-12 months based on history and previous results Starting at age 35 or earlier with diagnosis of diabetes, high cholesterol, BMI >26, and/or risk factors Frequent monitoring for patients with diabetes to ensure blood sugar control Thyroid  Panel (TSH w/ T3 & T4) Every 6 months based on history, symptoms, and risk factors May be repeated more  often if on medication HIV One time testing for all patients 65 and older May be repeated more frequently for patients with increased risk factors or exposure Hepatitis C One time testing for all patients 12 and older May be repeated more frequently for patients with increased risk factors or exposure Gonorrhea, Chlamydia Every 12 months for all sexually active persons 13-24 years Additional monitoring may be recommended for those who are considered high risk or who have symptoms PSA Men 36-43 years old with risk factors Additional screening may be recommended from age 26-69 based on risk factors, symptoms, and history  Vaccine Recommendations Tetanus Booster All adults every 10 years Flu Vaccine All patients 6 months and older every year COVID Vaccine All patients 12 years and older Initial dosing with booster May recommend additional booster based on age and health history HPV Vaccine 2 doses all patients age 47-26 Dosing may be considered for patients over 26 Shingles Vaccine (Shingrix ) 2 doses all adults 55 years and older Pneumonia (Pneumovax 23) All adults 65 years and older May recommend earlier dosing based on health history Pneumonia (Prevnar 13) All adults 65 years and older Dosed 1 year after Pneumovax 23  Additional Screening, Testing, and Vaccinations may be recommended on an individualized basis based on family history, health history, risk factors, and/or exposure.

## 2024-02-15 NOTE — Progress Notes (Signed)
 Dell Fennel, DNP, AGNP-c [facility] [address] [place], [place] [address] Main Office [phone]  BP 124/80   Pulse 63   Ht 5' 7.25" (1.708 m)   Wt 240 lb 3.2 oz (109 kg)   LMP 07/13/2016   BMI 37.34 kg/m    Subjective:    Patient ID: Mia Davis, female    DOB: 04-17-1966, 58 y.o.   MRN: [id number]  HPI: Mia Davis is a 57 y.o. female presenting on 02/15/2024 for comprehensive medical examination.   History of Present Illness Mia Davis is a 58 year old female who presents for CPE with worsening bilateral knee pain and new hip pain.  She experiences significant bilateral knee pain, described as 'something awful', persistent regardless of activity, including sitting, walking, or climbing stairs. The pain is located at the front of the knees and sometimes at the back of the left knee. Despite physical therapy, the knee pain persisted after her meniscus surgery in 2019. Various treatments including Biofreeze, Lanacane, Tylenol , and ibuprofen  have not provided relief. Cortisone injections offered minimal relief, with the first injection helping for only seven days and subsequent injections offering no relief.  Additional symptoms include her knees 'cracking all the time' and occasionally feeling like they are going to give out, impacting her ability to walk regularly. She used to walk on a trail at work but had to stop due to knee pain. She has knee braces but has not used them recently. Her past medical history includes arthritis and a Baker's cyst in the left knee. No swelling in the knees or feet is noted.  She reports a new pain that started about a month ago, located in the groin area and shooting up to her hip, causing her to limp. This pain occurs intermittently and is not associated with her knee pain. It does not radiate down the leg or into the back and does not worsen with hip movement or when lying on the hip.  She is currently taking blood  pressure medication, which she has not taken on the day of the visit, and she monitors her blood pressure at home. She has not taken meloxicam  before.  Pertinent items are noted in HPI.    Most Recent Depression Screen:     02/15/2024    8:54 AM 11/27/2022    8:32 AM 11/21/2021    2:06 PM 03/11/2021    8:31 AM 08/30/2020    8:33 AM  Depression screen PHQ 2/9  Decreased Interest 0 0 0 0 0  Down, Depressed, Hopeless 0 0 0 0 0  PHQ - 2 Score 0 0 0 0 0  Altered sleeping   0    Tired, decreased energy   3    Change in appetite   0    Feeling bad or failure about yourself    0    Trouble concentrating   0    Moving slowly or fidgety/restless   0    Suicidal thoughts   0    PHQ-9 Score   3     Most Recent Anxiety Screen:     08/16/2016    2:40 PM  GAD 7 : Generalized Anxiety Score  Nervous, Anxious, on Edge 0  Control/stop worrying 0  Worry too much - different things 0  Trouble relaxing 0  Restless 0  Easily annoyed or irritable 0  Afraid - awful might happen 0  Total GAD 7 Score 0   Most Recent Fall  Screen:    02/15/2024    8:54 AM 11/27/2022    8:32 AM 11/21/2021    2:06 PM 03/11/2021    8:31 AM 08/28/2019    9:52 AM  Fall Risk   Falls in the past year? 0 0 0 0 0  Number falls in past yr: 0 0 0 0 0  Injury with Fall? 0 0  0 0  Risk for fall due to : No Fall Risks No Fall Risks  No Fall Risks   Follow up Falls evaluation completed Falls evaluation completed  Falls evaluation completed     Past medical history, surgical history, medications, allergies, family history and social history reviewed with patient today and changes made to appropriate areas of the chart.  Past Medical History:  Past Medical History:  Diagnosis Date   Acute medial meniscal tear 04/21/2020   Acute right ankle pain 02/15/2024   Anemia    Colon polyps 01/01/2018   Diverticula of colon 01/01/2018   Encounter for annual physical exam 11/27/2022   Hypertension    Knee locking, right 03/17/2020    Medications:  Current Outpatient Medications on File Prior to Visit  Medication Sig   amLODipine  (NORVASC ) 10 MG tablet TAKE 1 TABLET(10 MG) BY MOUTH DAILY   Cholecalciferol  1.25 MG (50000 UT) capsule Take 1 capsule (50,000 Units total) by mouth once a week. Please repeat labs on completion of 6 months.   losartan  (COZAAR ) 100 MG tablet Take 1 tablet (100 mg total) by mouth daily.   pantoprazole  (PROTONIX ) 40 MG tablet Take 1 tablet every morning and 1 tablet every evening for 2 weeks then take 1 tablet every morning. (Patient not taking: Reported on 02/15/2024)   No current facility-administered medications on file prior to visit.   Surgical History:  Past Surgical History:  Procedure Laterality Date   ABDOMINAL HYSTERECTOMY  2017   abdominal    COLONOSCOPY     HYSTERECTOMY ABDOMINAL WITH SALPINGECTOMY Bilateral 09/12/2016   Procedure: HYSTERECTOMY ABDOMINAL WITH SALPINGECTOMY;  Surgeon: Verlyn Goad, MD;  Location: [facility];  Service: Gynecology;  Laterality: Bilateral;   KNEE SURGERY Right    POLYPECTOMY     TUBAL LIGATION     Allergies:  No Known Allergies Family History:  Family History  Problem Relation Age of Onset   Diabetes Mother    Hypertension Mother    Diabetes Father    Hypertension Father    Heart attack Father 34   Cancer Maternal Grandmother        pancreatic   Diabetes Sister    Hypertension Sister    Colon cancer Neg Hx    Colon polyps Neg Hx    Esophageal cancer Neg Hx    Stomach cancer Neg Hx    Rectal cancer Neg Hx        Objective:    BP 124/80   Pulse 63   Ht 5' 7.25" (1.708 m)   Wt 240 lb 3.2 oz (109 kg)   LMP 07/13/2016   BMI 37.34 kg/m   Wt Readings from Last 3 Encounters:  02/15/24 240 lb 3.2 oz (109 kg)  12/12/23 242 lb 3.2 oz (109.9 kg)  12/03/23 240 lb 6.4 oz (109 kg)    Physical Exam Vitals and nursing note reviewed.  Constitutional:      General: She is not in acute distress.    Appearance: Normal appearance. She is  obese. She is not ill-appearing.  HENT:     Head: Normocephalic and atraumatic.  Right Ear: Tympanic membrane normal.     Left Ear: Tympanic membrane normal.     Nose: Nose normal.     Mouth/Throat:     Mouth: Mucous membranes are moist.     Pharynx: Oropharynx is clear.  Eyes:     Conjunctiva/sclera: Conjunctivae normal.     Pupils: Pupils are equal, round, and reactive to light.  Neck:     Vascular: No carotid bruit.  Cardiovascular:     Rate and Rhythm: Normal rate and regular rhythm.     Pulses: Normal pulses.     Heart sounds: Normal heart sounds.  Pulmonary:     Effort: Pulmonary effort is normal.     Breath sounds: Normal breath sounds.  Abdominal:     General: Bowel sounds are normal. There is no distension.     Palpations: Abdomen is soft.     Tenderness: There is no abdominal tenderness. There is no right CVA tenderness, left CVA tenderness or guarding.  Musculoskeletal:        General: Normal range of motion.     Cervical back: Normal range of motion. No tenderness.     Right lower leg: No edema.     Left lower leg: No edema.       Legs:     Comments: Pain at the patellofemorals bilaterally with posterior knee pain on the left. No edema or laxity noted on exam.  Right groin pain radiating to the hip suggestive of ligament or tendon involvement. No decreased ROM or strength noted.   Lymphadenopathy:     Cervical: No cervical adenopathy.  Skin:    General: Skin is warm and dry.     Capillary Refill: Capillary refill takes less than 2 seconds.  Neurological:     Mental Status: She is alert and oriented to person, place, and time.     Sensory: No sensory deficit.     Motor: No weakness.     Coordination: Coordination normal.  Psychiatric:        Mood and Affect: Mood normal.        Behavior: Behavior normal.      Results for orders placed or performed in visit on 02/15/24  Hemoglobin A1c   Collection Time: 02/15/24 10:25 AM  Result Value Ref Range    Hgb A1c MFr Bld 5.8 (H) 4.8 - 5.6 %   Est. average glucose Bld gHb Est-mCnc 120 mg/dL  CBC with Differential/Platelet   Collection Time: 02/15/24 10:25 AM  Result Value Ref Range   WBC 6.2 3.4 - 10.8 x10E3/uL   RBC 5.50 (H) 3.77 - 5.28 x10E6/uL   Hemoglobin 14.4 11.1 - 15.9 g/dL   Hematocrit 16.1 09.6 - 46.6 %   MCV 79 79 - 97 fL   MCH 26.2 (L) 26.6 - 33.0 pg   MCHC 33.2 31.5 - 35.7 g/dL   RDW 04.5 40.9 - 81.1 %   Platelets 288 150 - 450 x10E3/uL   Neutrophils 54 Not Estab. %   Lymphs 36 Not Estab. %   Monocytes 7 Not Estab. %   Eos 3 Not Estab. %   Basos 0 Not Estab. %   Neutrophils Absolute 3.3 1.4 - 7.0 x10E3/uL   Lymphocytes Absolute 2.2 0.7 - 3.1 x10E3/uL   Monocytes Absolute 0.4 0.1 - 0.9 x10E3/uL   EOS (ABSOLUTE) 0.2 0.0 - 0.4 x10E3/uL   Basophils Absolute 0.0 0.0 - 0.2 x10E3/uL   Immature Granulocytes 0 Not Estab. %   Immature Grans (Abs) 0.0  0.0 - 0.1 x10E3/uL  Comprehensive metabolic panel with GFR   Collection Time: 02/15/24 10:25 AM  Result Value Ref Range   Glucose 104 (H) 70 - 99 mg/dL   BUN 12 6 - 24 mg/dL   Creatinine, Ser 1.61 (H) 0.57 - 1.00 mg/dL   eGFR 58 (L) >09 UE/AVW/0.98   BUN/Creatinine Ratio 11 9 - 23   Sodium 142 134 - 144 mmol/L   Potassium 4.1 3.5 - 5.2 mmol/L   Chloride 105 96 - 106 mmol/L   CO2 21 20 - 29 mmol/L   Calcium 9.1 8.7 - 10.2 mg/dL   Total Protein 6.7 6.0 - 8.5 g/dL   Albumin 4.2 3.8 - 4.9 g/dL   Globulin, Total 2.5 1.5 - 4.5 g/dL   Bilirubin Total 1.0 0.0 - 1.2 mg/dL   Alkaline Phosphatase 98 44 - 121 IU/L   AST 16 0 - 40 IU/L   ALT 17 0 - 32 IU/L  VITAMIN D  25 Hydroxy (Vit-D Deficiency, Fractures)   Collection Time: 02/15/24 10:25 AM  Result Value Ref Range   Vit D, 25-Hydroxy 34.3 30.0 - 100.0 ng/mL  Lipid panel   Collection Time: 02/15/24 10:25 AM  Result Value Ref Range   Cholesterol, Total 147 100 - 199 mg/dL   Triglycerides 83 0 - 149 mg/dL   HDL 43 >11 mg/dL   VLDL Cholesterol Cal 16 5 - 40 mg/dL   LDL Chol  Calc (NIH) 88 0 - 99 mg/dL   Chol/HDL Ratio 3.4 0.0 - 4.4 ratio       Assessment & Plan:   Problem List Items Addressed This Visit     Acute right hip pain   Relevant Medications   meloxicam  (MOBIC ) 15 MG tablet   Other Relevant Orders   Ambulatory referral to Orthopedic Surgery   Essential hypertension   Relevant Orders   CBC with Differential/Platelet (Completed)   Comprehensive metabolic panel with GFR (Completed)   Lipid panel (Completed)   Pre-diabetes   Relevant Orders   Hemoglobin A1c (Completed)   CBC with Differential/Platelet (Completed)   Comprehensive metabolic panel with GFR (Completed)   Lipid panel (Completed)   Vitamin D  deficiency   Relevant Orders   VITAMIN D  25 Hydroxy (Vit-D Deficiency, Fractures) (Completed)   BMI 40.0-44.9, adult (HCC)   Relevant Orders   Hemoglobin A1c (Completed)   CBC with Differential/Platelet (Completed)   Comprehensive metabolic panel with GFR (Completed)   Lipid panel (Completed)   Chronic pain of both knees   Relevant Medications   meloxicam  (MOBIC ) 15 MG tablet   Other Relevant Orders   Ambulatory referral to Orthopedic Surgery   Encounter for annual physical exam - Primary    Knee osteoarthritis with bone spurs Chronic bilateral knee pain since 2019, exacerbated by activity. Persistent symptoms post-meniscus surgery in 2019. Pain unrelieved by over-the-counter analgesics or cortisone injections. 2022 MRI showed degenerative changes, cartilage thinning, and bone spurs contributing to symptoms. Differential includes arthritis and mechanical issues from bone spurs. Discussed potential for gel injections as an alternative to cortisone, which previously provided limited relief. - Prescribe meloxicam  15 mg once daily for anti-inflammatory effect. - Refer to orthopedics for further evaluation and management, including potential imaging and treatment options such as gel injections.  Meniscus surgery in 2019 Persistent knee pain  and dysfunction post-meniscus surgery in 2019. Right knee has not improved post-surgery, with a history of re-injury. Discussed increased risk of arthritis post-surgery. - Include evaluation of post-surgical changes in orthopedic referral.  Baker's cyst in left knee Intermittent pain at the back of the left knee, possibly related to a Baker's cyst as identified in a previous MRI. Symptoms include a sensation of the knee giving out and cracking sounds. - Include evaluation of Baker's cyst in orthopedic referral.  Hip pain, unspecified Intermittent groin and hip pain, not associated with knee pain. Pain radiates to the hip, causing limping, but resolves with rest. Differential includes nerve pain, ligament, or tendon issues. No swelling or pain on palpation. - Include evaluation of hip pain in orthopedic referral.  Follow up plan: Return in about 1 year (around 02/14/2025) for CPE.  NEXT PREVENTATIVE PHYSICAL DUE IN 1 YEAR.  PATIENT COUNSELING PROVIDED FOR ALL ADULT PATIENTS: A well balanced diet low in saturated fats, cholesterol, and moderation in carbohydrates.  This can be as simple as monitoring portion sizes and cutting back on sugary beverages such as soda and juice to start with.    Daily water consumption of at least 64 ounces.  Physical activity at least 180 minutes per week.  If just starting out, start 10 minutes a day and work your way up.   This can be as simple as taking the stairs instead of the elevator and walking 2-3 laps around the office  purposefully every day.   STD protection, partner selection, and regular testing if high risk.  Limited consumption of alcoholic beverages if alcohol is consumed. For men, I recommend no more than 14 alcoholic beverages per week, spread out throughout the week (max 2 per day). Avoid "binge" drinking or consuming large quantities of alcohol in one setting.  Please let me know if you feel you may need help with reduction or quitting  alcohol consumption.   Avoidance of nicotine, if used. Please let me know if you feel you may need help with reduction or quitting nicotine use.   Daily mental health attention. This can be in the form of 5 minute daily meditation, prayer, journaling, yoga, reflection, etc.  Purposeful attention to your emotions and mental state can significantly improve your overall wellbeing  and  Health.  Please know that I am here to help you with all of your health care goals and am happy to work with you to find a solution that works best for you.  The greatest advice I have received with any changes in life are to take it one step at a time, that even means if all you can focus on is the next 60 seconds, then do that and celebrate your victories.  With any changes in life, you will have set backs, and that is OK. The important thing to remember is, if you have a set back, it is not a failure, it is an opportunity to try again! Screening Testing Mammogram Every 1 -2 years based on history and risk factors Starting at age 1 Pap Smear Ages 21-39 every 3 years Ages 28-65 every 5 years with HPV testing More frequent testing may be required based on results and history Colon Cancer Screening Every 1-10 years based on test performed, risk factors, and history Starting at age 66 Bone Density Screening Every 2-10 years based on history Starting at age 65 for women Recommendations for men differ based on medication usage, history, and risk factors AAA Screening One time ultrasound Men 28-21 years old who have every smoked Lung Cancer Screening Low Dose Lung CT every 12 months Age 10-80 years with a 30 pack-year smoking history who still smoke or who  have quit within the last 15 years   Screening Labs Routine  Labs: Complete Blood Count (CBC), Complete Metabolic Panel (CMP), Cholesterol (Lipid Panel) Every 6-12 months based on history and medications May be recommended more frequently based on  current conditions or previous results Hemoglobin A1c Lab Every 3-12 months based on history and previous results Starting at age 17 or earlier with diagnosis of diabetes, high cholesterol, BMI >26, and/or risk factors Frequent monitoring for patients with diabetes to ensure blood sugar control Thyroid  Panel (TSH) Every 6 months based on history, symptoms, and risk factors May be repeated more often if on medication HIV One time testing for all patients 23 and older May be repeated more frequently for patients with increased risk factors or exposure Hepatitis C One time testing for all patients 54 and older May be repeated more frequently for patients with increased risk factors or exposure Gonorrhea, Chlamydia Every 12 months for all sexually active persons 13-24 years Additional monitoring may be recommended for those who are considered high risk or who have symptoms Every 12 months for any woman on birth control, regardless of sexual activity PSA Men 48-4 years old with risk factors Additional screening may be recommended from age 71-69 based on risk factors, symptoms, and history  Vaccine Recommendations Tetanus Booster All adults every 10 years Flu Vaccine All patients 6 months and older every year COVID Vaccine All patients 12 years and older Initial dosing with booster May recommend additional booster based on age and health history HPV Vaccine 2 doses all patients age 46-26 Dosing may be considered for patients over 26 Shingles Vaccine (Shingrix ) 2 doses all adults 55 years and older Pneumonia (Pneumovax 23) All adults 65 years and older May recommend earlier dosing based on health history One year apart from Prevnar 13 Pneumonia (Prevnar 25) All adults 65 years and older Dosed 1 year after Pneumovax 23 Pneumonia (Prevnar 20) One time alternative to the two dosing of 13 and 23 For all adults with initial dose of 23, 20 is recommended 1 year later For all adults  with initial dose of 13, 23 is still recommended as second option 1 year later

## 2024-02-16 LAB — CBC WITH DIFFERENTIAL/PLATELET
Basophils Absolute: 0 10*3/uL (ref 0.0–0.2)
Basos: 0 %
EOS (ABSOLUTE): 0.2 10*3/uL (ref 0.0–0.4)
Eos: 3 %
Hematocrit: 43.4 % (ref 34.0–46.6)
Hemoglobin: 14.4 g/dL (ref 11.1–15.9)
Immature Grans (Abs): 0 10*3/uL (ref 0.0–0.1)
Immature Granulocytes: 0 %
Lymphocytes Absolute: 2.2 10*3/uL (ref 0.7–3.1)
Lymphs: 36 %
MCH: 26.2 pg — ABNORMAL LOW (ref 26.6–33.0)
MCHC: 33.2 g/dL (ref 31.5–35.7)
MCV: 79 fL (ref 79–97)
Monocytes Absolute: 0.4 10*3/uL (ref 0.1–0.9)
Monocytes: 7 %
Neutrophils Absolute: 3.3 10*3/uL (ref 1.4–7.0)
Neutrophils: 54 %
Platelets: 288 10*3/uL (ref 150–450)
RBC: 5.5 x10E6/uL — ABNORMAL HIGH (ref 3.77–5.28)
RDW: 14.4 % (ref 11.7–15.4)
WBC: 6.2 10*3/uL (ref 3.4–10.8)

## 2024-02-16 LAB — COMPREHENSIVE METABOLIC PANEL WITH GFR
ALT: 17 IU/L (ref 0–32)
AST: 16 IU/L (ref 0–40)
Albumin: 4.2 g/dL (ref 3.8–4.9)
Alkaline Phosphatase: 98 IU/L (ref 44–121)
BUN/Creatinine Ratio: 11 (ref 9–23)
BUN: 12 mg/dL (ref 6–24)
Bilirubin Total: 1 mg/dL (ref 0.0–1.2)
CO2: 21 mmol/L (ref 20–29)
Calcium: 9.1 mg/dL (ref 8.7–10.2)
Chloride: 105 mmol/L (ref 96–106)
Creatinine, Ser: 1.1 mg/dL — ABNORMAL HIGH (ref 0.57–1.00)
Globulin, Total: 2.5 g/dL (ref 1.5–4.5)
Glucose: 104 mg/dL — ABNORMAL HIGH (ref 70–99)
Potassium: 4.1 mmol/L (ref 3.5–5.2)
Sodium: 142 mmol/L (ref 134–144)
Total Protein: 6.7 g/dL (ref 6.0–8.5)
eGFR: 58 mL/min/{1.73_m2} — ABNORMAL LOW (ref >59)

## 2024-02-16 LAB — LIPID PANEL
Cholesterol, Total: 147 mg/dL (ref 100–199)
HDL: 43 mg/dL (ref >39)
LDL CALC COMMENT:: 3.4 ratio (ref 0.0–4.4)
LDL Chol Calc (NIH): 88 mg/dL (ref 0–99)
Triglycerides: 83 mg/dL (ref 0–149)
VLDL Cholesterol Cal: 16 mg/dL (ref 5–40)

## 2024-02-16 LAB — HEMOGLOBIN A1C
Est. average glucose Bld gHb Est-mCnc: 120 mg/dL
Hgb A1c MFr Bld: 5.8 % — ABNORMAL HIGH (ref 4.8–5.6)

## 2024-02-16 LAB — VITAMIN D 25 HYDROXY (VIT D DEFICIENCY, FRACTURES): Vit D, 25-Hydroxy: 34.3 ng/mL (ref 30.0–100.0)

## 2024-02-26 ENCOUNTER — Ambulatory Visit: Payer: Self-pay | Admitting: Nurse Practitioner

## 2024-03-13 DIAGNOSIS — M170 Bilateral primary osteoarthritis of knee: Secondary | ICD-10-CM | POA: Diagnosis not present

## 2024-03-13 DIAGNOSIS — M25551 Pain in right hip: Secondary | ICD-10-CM | POA: Diagnosis not present

## 2024-03-13 DIAGNOSIS — M1712 Unilateral primary osteoarthritis, left knee: Secondary | ICD-10-CM | POA: Diagnosis not present

## 2024-03-13 DIAGNOSIS — M1711 Unilateral primary osteoarthritis, right knee: Secondary | ICD-10-CM | POA: Diagnosis not present

## 2024-08-23 ENCOUNTER — Other Ambulatory Visit: Payer: Self-pay | Admitting: Nurse Practitioner

## 2024-08-23 DIAGNOSIS — K259 Gastric ulcer, unspecified as acute or chronic, without hemorrhage or perforation: Secondary | ICD-10-CM

## 2024-08-26 ENCOUNTER — Encounter: Payer: Self-pay | Admitting: Family Medicine

## 2024-08-26 ENCOUNTER — Ambulatory Visit: Admitting: Family Medicine

## 2024-08-26 ENCOUNTER — Encounter: Payer: Self-pay | Admitting: Nurse Practitioner

## 2024-08-26 ENCOUNTER — Ambulatory Visit
Admission: RE | Admit: 2024-08-26 | Discharge: 2024-08-26 | Disposition: A | Source: Ambulatory Visit | Attending: Family Medicine

## 2024-08-26 ENCOUNTER — Ambulatory Visit: Payer: Self-pay

## 2024-08-26 VITALS — BP 126/82 | HR 70 | Wt 235.0 lb

## 2024-08-26 DIAGNOSIS — G8929 Other chronic pain: Secondary | ICD-10-CM | POA: Diagnosis not present

## 2024-08-26 DIAGNOSIS — M25571 Pain in right ankle and joints of right foot: Secondary | ICD-10-CM | POA: Diagnosis not present

## 2024-08-26 DIAGNOSIS — Z23 Encounter for immunization: Secondary | ICD-10-CM | POA: Diagnosis not present

## 2024-08-26 DIAGNOSIS — M25562 Pain in left knee: Secondary | ICD-10-CM

## 2024-08-26 DIAGNOSIS — S92151A Displaced avulsion fracture (chip fracture) of right talus, initial encounter for closed fracture: Secondary | ICD-10-CM | POA: Diagnosis not present

## 2024-08-26 DIAGNOSIS — M25561 Pain in right knee: Secondary | ICD-10-CM

## 2024-08-26 MED ORDER — MELOXICAM 15 MG PO TABS: 15.0000 mg | ORAL_TABLET | Freq: Every day | ORAL | 2 refills | Status: AC
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
## 2024-08-26 NOTE — Telephone Encounter (Signed)
 Called Patient and made an appt for her with Dr. Vita

## 2024-08-26 NOTE — Progress Notes (Signed)
   Name: Mia Davis   Date of Visit: 08/26/24   Date of last visit with me: Visit date not found   CHIEF COMPLAINT:  Chief Complaint  Patient presents with   Acute Visit    Fell yesterday. Slid on leaves, landed on back right leg went back.        HPI:  Discussed the use of AI scribe software for clinical note transcription with the patient, who gave verbal consent to proceed.  History of Present Illness   Mia Davis is a 58 year old female who presents with knee and ankle pain after a fall.  She slipped on some leaves yesterday, causing her knee to bend under and behind her. She has a history of a torn meniscus in the same knee, which was previously repaired, but she continues to experience lingering pain. She reports pain in the knee, particularly in the area where she landed.  She also experienced immediate pain in her ankle following the fall, which has become swollen. She is unsure if the ankle bent in any particular direction during the incident. The pain in the ankle was the most bothersome immediately after the fall.  For pain management, she initially took meloxicam  and later took two Advils last night. She has not taken any additional medication since then. She has been icing the affected areas and elevating her leg.         OBJECTIVE:       02/15/2024    8:54 AM  Depression screen PHQ 2/9  Decreased Interest 0  Down, Depressed, Hopeless 0  PHQ - 2 Score 0     BP Readings from Last 3 Encounters:  08/26/24 126/82  02/15/24 124/80  12/12/23 128/78    BP 126/82   Pulse 70   Wt 235 lb (106.6 kg)   LMP 07/13/2016   SpO2 97%   BMI 36.53 kg/m         Physical Exam  Inspection of the right knee reveals some swelling around the knee joint.  Tenderness to palpation over the medial lateral joint lines with significant tenderness to palpation over the tibial tubercle.  Range of motion is full with flexion and extension.  McMurray's is negative.  Ankle  swelling on inspection.  Tenderness to palpation over the ATFL and the AITFL.  Significant pain with dorsiflexion inversion eversion testing.  No pain with plantarflexion.  Unable to bear weight. ASSESSMENT/PLAN:   Assessment & Plan Chronic pain of right knee  Chronic pain of both knees  Acute right ankle pain  Flu vaccine need  Need for COVID-19 vaccine    Assessment and Plan    Acute on chronic right knee and acute ankle pain following injury Acute pain and swelling in right knee and ankle post-fall. Differential includes tibial fracture and ankle sprain. Imaging required to exclude fractures. - Ordered x-rays of right knee, ankle, and tibia. - Provided cam boot for immobilization. - Advised crutches initially, transition to boot as tolerated. - Continue meloxicam  daily for ten days. - Instructed to ice area, avoid heat. - Scheduled follow-up in four weeks.  Encounter for immunization Requested COVID and flu vaccinations. - Administered COVID and flu vaccinations.         Lusine Corlett A. Vita MD [facility]

## 2024-08-26 NOTE — Telephone Encounter (Signed)
 Pt needs an appointment

## 2024-08-27 ENCOUNTER — Encounter: Payer: Self-pay | Admitting: Family Medicine

## 2024-09-09 ENCOUNTER — Other Ambulatory Visit: Payer: Self-pay | Admitting: Nurse Practitioner

## 2024-09-09 DIAGNOSIS — I10 Essential (primary) hypertension: Secondary | ICD-10-CM

## 2024-09-25 ENCOUNTER — Ambulatory Visit: Admitting: Family Medicine

## 2024-10-31 LAB — HM MAMMOGRAPHY

## 2024-11-03 ENCOUNTER — Encounter: Payer: Self-pay | Admitting: Nurse Practitioner

## 2025-03-09 ENCOUNTER — Encounter: Payer: Self-pay | Admitting: Nurse Practitioner
